# Patient Record
Sex: Female | Born: 1935 | Race: White | Hispanic: No | Smoking: Former smoker
Health system: Southern US, Community
[De-identification: ages and names within clinical notes are randomized; demographics above are authoritative.]

## PROBLEM LIST (undated history)

## (undated) DIAGNOSIS — G4700 Insomnia, unspecified: Secondary | ICD-10-CM

## (undated) DIAGNOSIS — IMO0001 Reserved for inherently not codable concepts without codable children: Secondary | ICD-10-CM

## (undated) DIAGNOSIS — E079 Disorder of thyroid, unspecified: Secondary | ICD-10-CM

## (undated) DIAGNOSIS — R32 Unspecified urinary incontinence: Secondary | ICD-10-CM

## (undated) DIAGNOSIS — D649 Anemia, unspecified: Secondary | ICD-10-CM

## (undated) DIAGNOSIS — S52529A Torus fracture of lower end of unspecified radius, initial encounter for closed fracture: Secondary | ICD-10-CM

## (undated) DIAGNOSIS — I10 Essential (primary) hypertension: Secondary | ICD-10-CM

## (undated) DIAGNOSIS — C4490 Unspecified malignant neoplasm of skin, unspecified: Secondary | ICD-10-CM

## (undated) DIAGNOSIS — C801 Malignant (primary) neoplasm, unspecified: Secondary | ICD-10-CM

## (undated) DIAGNOSIS — K649 Unspecified hemorrhoids: Secondary | ICD-10-CM

## (undated) DIAGNOSIS — L570 Actinic keratosis: Secondary | ICD-10-CM

## (undated) DIAGNOSIS — M1990 Unspecified osteoarthritis, unspecified site: Secondary | ICD-10-CM

## (undated) DIAGNOSIS — R0902 Hypoxemia: Secondary | ICD-10-CM

## (undated) DIAGNOSIS — E039 Hypothyroidism, unspecified: Secondary | ICD-10-CM

## (undated) DIAGNOSIS — K589 Irritable bowel syndrome without diarrhea: Secondary | ICD-10-CM

## (undated) DIAGNOSIS — IMO0002 Reserved for concepts with insufficient information to code with codable children: Secondary | ICD-10-CM

## (undated) DIAGNOSIS — I8390 Asymptomatic varicose veins of unspecified lower extremity: Secondary | ICD-10-CM

## (undated) DIAGNOSIS — R269 Unspecified abnormalities of gait and mobility: Secondary | ICD-10-CM

## (undated) HISTORY — DX: Insomnia, unspecified: G47.00

## (undated) HISTORY — PX: APPENDECTOMY: SHX54

## (undated) HISTORY — DX: Essential (primary) hypertension: I10

## (undated) HISTORY — DX: Disorder of thyroid, unspecified: E07.9

## (undated) HISTORY — DX: Irritable bowel syndrome, unspecified: K58.9

## (undated) HISTORY — DX: Hypothyroidism, unspecified: E03.9

## (undated) HISTORY — DX: Unspecified urinary incontinence: R32

## (undated) HISTORY — PX: OTHER SURGICAL HISTORY: SHX169

## (undated) HISTORY — DX: Unspecified hemorrhoids: K64.9

## (undated) HISTORY — DX: Asymptomatic varicose veins of unspecified lower extremity: I83.90

## (undated) HISTORY — DX: Anemia, unspecified: D64.9

## (undated) HISTORY — DX: Reserved for inherently not codable concepts without codable children: IMO0001

## (undated) HISTORY — PX: ABDOMINAL HYSTERECTOMY: SHX81

## (undated) HISTORY — DX: Unspecified abnormalities of gait and mobility: R26.9

## (undated) HISTORY — DX: Torus fracture of lower end of unspecified radius, initial encounter for closed fracture: S52.529A

## (undated) HISTORY — DX: Hypoxemia: R09.02

## (undated) HISTORY — DX: Unspecified osteoarthritis, unspecified site: M19.90

## (undated) HISTORY — DX: Reserved for concepts with insufficient information to code with codable children: IMO0002

---

## 1998-10-12 ENCOUNTER — Other Ambulatory Visit: Admission: RE | Admit: 1998-10-12 | Discharge: 1998-10-12 | Payer: Self-pay | Admitting: Obstetrics and Gynecology

## 1999-08-24 ENCOUNTER — Encounter: Admission: RE | Admit: 1999-08-24 | Discharge: 1999-08-24 | Payer: Self-pay | Admitting: Endocrinology

## 1999-08-24 ENCOUNTER — Encounter: Payer: Self-pay | Admitting: Endocrinology

## 2000-02-15 ENCOUNTER — Other Ambulatory Visit: Admission: RE | Admit: 2000-02-15 | Discharge: 2000-02-15 | Payer: Self-pay | Admitting: Obstetrics and Gynecology

## 2000-12-24 ENCOUNTER — Ambulatory Visit (HOSPITAL_COMMUNITY): Admission: RE | Admit: 2000-12-24 | Discharge: 2000-12-24 | Payer: Self-pay | Admitting: Urology

## 2001-07-31 ENCOUNTER — Encounter: Payer: Self-pay | Admitting: Internal Medicine

## 2001-07-31 ENCOUNTER — Encounter: Admission: RE | Admit: 2001-07-31 | Discharge: 2001-07-31 | Payer: Self-pay | Admitting: Internal Medicine

## 2001-10-08 HISTORY — PX: FRACTURE SURGERY: SHX138

## 2001-10-08 HISTORY — PX: WRIST FRACTURE SURGERY: SHX121

## 2002-01-16 ENCOUNTER — Other Ambulatory Visit: Admission: RE | Admit: 2002-01-16 | Discharge: 2002-01-16 | Payer: Self-pay | Admitting: Gynecology

## 2002-06-02 ENCOUNTER — Inpatient Hospital Stay (HOSPITAL_COMMUNITY): Admission: RE | Admit: 2002-06-02 | Discharge: 2002-06-04 | Payer: Self-pay | Admitting: Urology

## 2002-06-19 ENCOUNTER — Encounter: Admission: RE | Admit: 2002-06-19 | Discharge: 2002-06-19 | Payer: Self-pay | Admitting: Urology

## 2002-06-19 ENCOUNTER — Encounter: Payer: Self-pay | Admitting: Urology

## 2002-08-03 ENCOUNTER — Encounter: Payer: Self-pay | Admitting: Internal Medicine

## 2002-08-03 ENCOUNTER — Encounter: Admission: RE | Admit: 2002-08-03 | Discharge: 2002-08-03 | Payer: Self-pay | Admitting: Internal Medicine

## 2003-02-02 ENCOUNTER — Other Ambulatory Visit: Admission: RE | Admit: 2003-02-02 | Discharge: 2003-02-02 | Payer: Self-pay | Admitting: Gynecology

## 2003-02-17 ENCOUNTER — Ambulatory Visit (HOSPITAL_COMMUNITY): Admission: RE | Admit: 2003-02-17 | Discharge: 2003-02-17 | Payer: Self-pay | Admitting: Gastroenterology

## 2003-08-12 ENCOUNTER — Encounter: Admission: RE | Admit: 2003-08-12 | Discharge: 2003-08-12 | Payer: Self-pay | Admitting: Internal Medicine

## 2004-03-08 ENCOUNTER — Other Ambulatory Visit: Admission: RE | Admit: 2004-03-08 | Discharge: 2004-03-08 | Payer: Self-pay | Admitting: Gynecology

## 2004-04-11 ENCOUNTER — Encounter: Admission: RE | Admit: 2004-04-11 | Discharge: 2004-04-11 | Payer: Self-pay | Admitting: Gynecology

## 2004-08-14 ENCOUNTER — Encounter: Admission: RE | Admit: 2004-08-14 | Discharge: 2004-08-14 | Payer: Self-pay | Admitting: Gynecology

## 2005-03-01 ENCOUNTER — Encounter
Admission: RE | Admit: 2005-03-01 | Discharge: 2005-03-01 | Payer: Self-pay | Admitting: Physical Medicine and Rehabilitation

## 2005-08-24 ENCOUNTER — Encounter: Admission: RE | Admit: 2005-08-24 | Discharge: 2005-08-24 | Payer: Self-pay | Admitting: Internal Medicine

## 2006-08-26 ENCOUNTER — Encounter: Admission: RE | Admit: 2006-08-26 | Discharge: 2006-08-26 | Payer: Self-pay | Admitting: Internal Medicine

## 2007-06-16 ENCOUNTER — Other Ambulatory Visit: Admission: RE | Admit: 2007-06-16 | Discharge: 2007-06-16 | Payer: Self-pay | Admitting: Gynecology

## 2007-08-28 ENCOUNTER — Encounter: Admission: RE | Admit: 2007-08-28 | Discharge: 2007-08-28 | Payer: Self-pay | Admitting: Internal Medicine

## 2008-08-30 ENCOUNTER — Encounter: Admission: RE | Admit: 2008-08-30 | Discharge: 2008-08-30 | Payer: Self-pay | Admitting: Internal Medicine

## 2010-07-31 ENCOUNTER — Inpatient Hospital Stay (HOSPITAL_COMMUNITY)
Admission: RE | Admit: 2010-07-31 | Discharge: 2010-08-03 | Payer: Self-pay | Source: Home / Self Care | Admitting: Orthopedic Surgery

## 2010-07-31 HISTORY — PX: TOTAL HIP ARTHROPLASTY: SHX124

## 2010-12-20 LAB — URINALYSIS, ROUTINE W REFLEX MICROSCOPIC
Hgb urine dipstick: NEGATIVE
Ketones, ur: NEGATIVE mg/dL
Specific Gravity, Urine: 1.011 (ref 1.005–1.030)
Urobilinogen, UA: 0.2 mg/dL (ref 0.0–1.0)
pH: 7.5 (ref 5.0–8.0)

## 2010-12-20 LAB — CBC
HCT: 28.4 % — ABNORMAL LOW (ref 36.0–46.0)
HCT: 31.4 % — ABNORMAL LOW (ref 36.0–46.0)
HCT: 39.8 % (ref 36.0–46.0)
Hemoglobin: 10.6 g/dL — ABNORMAL LOW (ref 12.0–15.0)
Hemoglobin: 13.5 g/dL (ref 12.0–15.0)
MCH: 33.9 pg (ref 26.0–34.0)
MCH: 33.9 pg (ref 26.0–34.0)
MCH: 34 pg (ref 26.0–34.0)
MCHC: 33.9 g/dL (ref 30.0–36.0)
MCHC: 34.2 g/dL (ref 30.0–36.0)
MCV: 99.2 fL (ref 78.0–100.0)
MCV: 99.5 fL (ref 78.0–100.0)
Platelets: 157 10*3/uL (ref 150–400)
RBC: 3.37 MIL/uL — ABNORMAL LOW (ref 3.87–5.11)
RBC: 3.97 MIL/uL (ref 3.87–5.11)
RDW: 13.4 % (ref 11.5–15.5)
RDW: 13.4 % (ref 11.5–15.5)
RDW: 13.7 % (ref 11.5–15.5)
WBC: 6.5 10*3/uL (ref 4.0–10.5)
WBC: 9.3 10*3/uL (ref 4.0–10.5)

## 2010-12-20 LAB — PROTIME-INR
INR: 1.15 (ref 0.00–1.49)
INR: 1.4 (ref 0.00–1.49)
Prothrombin Time: 13.3 seconds (ref 11.6–15.2)
Prothrombin Time: 14.2 seconds (ref 11.6–15.2)
Prothrombin Time: 14.9 seconds (ref 11.6–15.2)

## 2010-12-20 LAB — COMPREHENSIVE METABOLIC PANEL
AST: 20 U/L (ref 0–37)
Alkaline Phosphatase: 85 U/L (ref 39–117)
BUN: 14 mg/dL (ref 6–23)
CO2: 31 mEq/L (ref 19–32)
Chloride: 98 mEq/L (ref 96–112)
Creatinine, Ser: 0.55 mg/dL (ref 0.4–1.2)
GFR calc Af Amer: >60 mL/min (ref >60)
Glucose, Bld: 92 mg/dL (ref 70–99)
Total Protein: 6.9 g/dL (ref 6.0–8.3)

## 2010-12-20 LAB — BASIC METABOLIC PANEL
CO2: 28 mEq/L (ref 19–32)
Calcium: 8.7 mg/dL (ref 8.4–10.5)
Calcium: 9 mg/dL (ref 8.4–10.5)
Creatinine, Ser: 0.59 mg/dL (ref 0.4–1.2)
GFR calc Af Amer: >60 mL/min (ref >60)
GFR calc Af Amer: >60 mL/min (ref >60)
Glucose, Bld: 95 mg/dL (ref 70–99)
Potassium: 4.8 mEq/L (ref 3.5–5.1)
Sodium: 135 mEq/L (ref 135–145)

## 2010-12-20 LAB — TYPE AND SCREEN
ABO/RH(D): O POS
DAT, IgG: NEGATIVE

## 2010-12-20 LAB — APTT: aPTT: 35 seconds (ref 24–37)

## 2010-12-20 LAB — POTASSIUM: Potassium: 4.9 mEq/L (ref 3.5–5.1)

## 2011-02-23 NOTE — Discharge Summary (Signed)
[name], [name] NO.:  [id number]   MEDICAL RECORD NO.:  [id number]                   PATIENT TYPE:  INP   LOCATION:  0364                                 FACILITY:  [facility]   PHYSICIAN:  Jamison Neighbor, M.D.               DATE OF BIRTH:  04/08/1927   DATE OF ADMISSION:  06/02/2002  DATE OF DISCHARGE:  06/04/2002                                 DISCHARGE SUMMARY   DISCHARGE DIAGNOSES:  1. Vaginal wall prolapse.  2. Enterocele.  3. Hypertension.  4. Mixed urinary incontinence.   PRINCIPAL PROCEDURES:  Vaginal sacropexy, paravaginal repair of cystocele,  Burch bladder neck suspension.   SURGEON:  Jamison Neighbor, M.D.   HISTORY:  This 77 year old female initially presented to my office with  mixed urinary incontinence.  She was treated with anticholinergics, but  still had problems with stress incontinence.  Urodynamics showed diminished  __________  pressure and __________ showed loss of urine.  The patient had a  stress appropriately corrected with a pubovaginal sling.  The patient had  moderately high postvoid residuals in the range of 90 cc.  She still had  residual urge incontinence problems that were successfully treated with an  Oxytrol patch.  The patient presented to the office with new onset of  enterocele and vaginal vault prolapse.  The patient was given options to  therapy and elected to undergo an abdominal approach.  She asked that the  sling be assessed at the time of surgery to determine if any correction  needed to be done.  She was admitted following her vaginal sacropexy and  repair of cystocele and enterocele.   PAST MEDICAL HISTORY:  Unremarkable.  She has mild hypertension.   PAST SURGICAL HISTORY:  Removal of adrenal mass in 1976.  She has also had  hysterectomy and appendectomy.   SOCIAL HISTORY:  Well described in the initial history and physical.   PHYSICAL EXAMINATION:  Well described in the initial history  and physical.   The patient was taken to the operating room on June 02, 2002 where she  underwent vaginal sacropexy, enterocele repair, repair of vaginal tear,  cystocele, and a Burch colposuspension.  Her postoperative course was  unremarkable.  She rapidly advanced to a regular diet.  She tolerated this  without difficulty.  By postoperative day number two she  had passed a voiding trial.  She was eating regular food and ambulating  without any difficulty.  She had voided over 400 cc in one session.  She  says it felt like a much more normal void than she has had in the past.  The  patient was sent home with prescription for Tylox and Keflex and will return  to the office in followup for staple removal.  Jamison Neighbor, M.D.    RJE/MEDQ  D:  06/16/2002  T:  06/16/2002  Job:  [id number]   cc:   Gerlene Burdock A. Jacky Kindle, M.D.

## 2011-02-23 NOTE — Op Note (Signed)
[facility]  Patient:    Carol [name], Carol [name]                         MRN: [id number] Proc. Date: 12/24/00 Adm. Date:  [id number] Attending:  Londell Moh                           Operative Report  PREOPERATIVE DIAGNOSIS:  Stress urinary incontinence.  POSTOPERATIVE DIAGNOSIS:  Stress urinary incontinence.  OPERATION:  Sparc pubovaginal sling plus cystoscopy.  SURGEON:  Jamison Neighbor, M.D.  ANESTHESIA:  Spinal.  COMPLICATIONS:  None.  DRAIN:  16-French Foley catheter to be removed prior to discharge today.  BRIEF HISTORY:  This 77 year old female has stress urinary incontinence as well as a very modest cystocele.  Urodynamic evaluation was performed.  The patient had no evidence of urge incontinence but had evidence of stress incontinence.  The patient underwent cystoscopy which showed an open bladder neck.  The patient has a moderate cystocele and had a very difficult time deciding if she wanted concomitant cystocele repair done at the time of the sling.  The patient initially felt that she wanted a complete outpatient procedure.  For that reason, the Sparc system was selected.  She subsequently felt that she might want to have the cystocele repair done.  Decision was made to perform this under spinal anesthesia so the patient could be consulted intraoperatively and that we would do a minimal procedure if possible, but if there is a large prolapsing cystocele, this would be repaired at the time. The patient understands that if the cystocele is repaired, she needs to stay overnight and then go home with a suprapubic tube.  If the patient has a simple sling using the transvaginal tape system, the patient cannot go home without a catheter.  She understands the risks and benefits of the procedure and gave full and informed consent.  DESCRIPTION OF PROCEDURE:  After the successful induction of a spinal anesthesia, the patient was placed in the low  lithotomy position, prepped with Betadine, and draped in the usual sterile fashion.  The labia was sutured out to the medial thigh.  A weighted vaginal speculum was placed.  Careful inspection revealed there was a very modest cystocele, no enterocele, no rectocele, and no significant vaginal vault prolapse.  It was felt initially that the patient could do just fine with a simple vaginal tape sling.  The area just underneath the urethra was infiltrated with local anesthesia, and a small incision was made directly under the urethra at the proximal level of the mid urethral complex.  Posterior dissection was taken back to the space of Retzius which was not opened.  The patient had two small incisions just the width of a scalpel blade made directly above the pubic bone.  The bladder was drained.  The Sparc needles were then passed from the top incision down to the bottom incision using finger guide.  These were used to pull the tension-free vaginal tape up to the upper incision.  The tape was then tensioned with a right angle clamp.  Cystoscopy was performed.  The bladder was inspected. It was free of any tumor or stones.  Both ureteral orifices were of normal configuration and location.  There was no evidence of injury to the bladder from the sling itself.  The bladder neck appeared to be nicely coapted but not angulated.  The cystoscope could be  inserted, and there was 30 to 45 degrees of play.  The bladder was filled.  There was no leakage, but with the crede, there was prompt straight flow of urine.  This appeared to indicate that a proper degree of tension was obtained.  The area was inspected one final time. A right angle clamp could still be placed underneath the urethra ensuring there was not excessive tension.  The protective sheath from the Sparc system was then pulled away, and the Prolene stitch was cut.  The sling was then cut flush with the skin, and the skin was closed with a  Steri-Strip.  The mucosa was closed with a running suture of 2-0 Vicryl.  At this point, a careful inspection showed there was really very minimal residual cystocele, and it was felt from a morbidity standpoint that this did not really need to be repaired. The patients sedation was reversed.  She was allowed to make a decision as to whether or not she wished to have additional procedure done, and the decision was made jointly to not perform the cystocele repair at this time knowing full well that this may be necessary in the future.  The patients principal concern is her stress incontinence, and this appears to be well addressed with the sling.  The patient tolerated the procedure well and was taken to the recovery room in good condition with Foley catheter in place.  She will be given a voiding trial this afternoon and can go home if voiding normally. Otherwise, she can go home with a Foley catheter for voiding trial in 24 to 48 hours. DD:  12/24/00 TD:  12/24/00 Job: [id number]

## 2011-02-23 NOTE — Op Note (Signed)
Carol [name], FRIESENHAHN NO.:  [id number]   MEDICAL RECORD NO.:  [id number]                   PATIENT TYPE:  INP   LOCATION:  0364                                 FACILITY:  [facility]   PHYSICIAN:  Jamison Neighbor, M.D.               DATE OF BIRTH:  04/08/1927   DATE OF PROCEDURE:  06/02/2002  DATE OF DISCHARGE:                                 OPERATIVE REPORT   PREOPERATIVE DIAGNOSES:  1. Vaginal vault prolapse with associated enterocele.  2. Cystocele.  3. Possible overcorrection of bladder neck.   POSTOPERATIVE DIAGNOSES:  1. Vaginal vault prolapse with associated enterocele.  2. Cystocele.  3. Possible overcorrection of bladder neck.   PROCEDURE:  1. Vaginal sacrocolpopexy.  2. Moschowitz enterocele repair.  3. Burch paravaginal repair.  4. Partial takedown of bladder neck suspension.   SURGEON:  Jamison Neighbor, M.D.   ASSISTANT:  Crecencio Mc, M.D. [facility] resident).   ANESTHESIA:  General after failed spinal.   COMPLICATIONS:  None.   DRAINS:  Foley catheter.   BRIEF HISTORY:  This 77 year old female has a somewhat complicated story.  The patient initially presented to the office with a complaint of urinary  incontinence that turned out to be a mixed urinary incontinence.  At first,  she was felt to have an overactive bladder and was treated with different  medications; however, she refused to take any of these long-term, feeling  that they bothered her more than they helped.  With a urodynamic evaluation  which showed a leak point pressure, and she was found to have urethral  mobility with bolus of urine.  She was told at the initial procedure that  correction of the stress incontinence would not necessarily improve her  urgency and that she would still require long-term therapy.  The patient  elected to have the pubovaginal sling which did take care of her stress  incontinence; however, she feels that the urgency has gotten  worse.  We have  carefully checked her.  The urethra is in normal position.  It did not  appear to be overcorrected.  She had postvoid residuals in the range of 90-  100 cc.  Urethra was in a neutral position when calibrated, and there was no  signs of stenosis or stricturing.  The patient returned a few years later  with posterior prolapse that was primarily an enterocele with a little bit  of central defect as well.  The patient still has good support for the  bladder neck.   The patient was given two options for therapy; one was a transvaginal  anterior repair with an enterocele repair and possible sacrospinalis  fixation.  The other option would be a vaginal sacropexy paravaginal repair  and possible Burch suspension.  The patient wished to have the second  operation, and she did not wish to have another vaginal approach.  She  asked if the sling could be evaluated to determine if it needed a partial  takedown.  The patient understands the risks and benefits of the procedure.  She knows that the principal reason for this procedure is to correct the  vault and to elevate the back of the vagina, eliminate the enterocele.  I  promised her that I would do a careful intraoperative evaluation of the  bladder neck and do a partial takedown if I felt this was indicated.  The  patient understands the potential risk for ongoing urgency which, as noted  above, predates any procedure.  She also notes the possibility of bowel  obstruction, ureteral injury, bleeding, etc.  Full and informed consent was  obtained.   DESCRIPTION OF PROCEDURE:  After the successful induction of general  anesthesia, the patient was placed in the dorsal lithotomy position with the  legs in a low position.  She was then prepped and draped in the usual  sterile fashion.  The patient had a previous Pfannenstiel incision which was  opened.  The incision was carried down through her minimal subcutaneous fat  until the  rectus abdominis sheath was identified.  The rectus sheath was  identified and elevated off the underlying rectus abdominis musculature on  both sides.  On the left side, the muscle was noted to be very thin and  attenuated, possibly from previous surgery, but the fascia itself appeared  to be of good quality.  The rectus was then opened in the midline, allowing  entry into the space of Retzius.  Foley catheter was inserted, and the  bladder was drained.  The patient was noted to have a very large capacity  bladder, holding approximately 600 cc.  The Bookwalter retractor was placed,  and the abdominal contents were retracted superiorly.  The retroperitoneal  incision was then made directly over the sacral promontory.  Blunt  dissection was used to expose the sacral promontory.  Three separate sutures  of 0 Prolene were then placed in the sacral promontory in preparation for  the match suspension.  A narrow Deaver retractor was placed within the  vagina to elevate the vaginal vault.  Three separate sutures of 0 Prolene  were placed into the sacral promontory with double bites taken.  A piece of  Marlex mesh was then obtained and was sutured down to the previously-placed  sacral promontory sutures.  The sutures from the vaginal vault were then  sutured to the Marlex strut with an appropriate degree of tension. When this  was completely tied down, there was excellent elevation of the bladder neck.  Prior to the elevation of bladder neck, a Moschowitz repair had been  performed with a cerclage incision used to completely close off the  enterocele sac.  Care was taken to avoid any injury to the ureters.  A  second Vicryl suture was now used to close the retroperitoneal space over  top of the Marlex mesh.  These two were tied together, completely closing  off the enterocele and completely baring the Marlex into the retroperitoneal  space.  Attention was then directed to the bladder neck.  This was  partially taken down.  The sling material had apparently pulled away from the back of  the rectus bone, as it could not be easily identified.  However, the  attachments to the periosteum were carefully taken down so that the bladder  neck was partially mobilized.  Care was taken to avoid excessive  mobilization for fear that  the patient would develop stress incontinence.  The patient did not require stitches directly at the bladder neck, but  paravaginal type stitches were placed further back to elevate the lateral  aspect of the vagina and to eliminate some of the cystocele that the patient  had posteriorly.  The sutures were brought from the paravaginal tissue up to  the Cooper's ligament.  These were then tied down with appropriate degree of  tension, completing the repair.  Careful vaginal examination showed that the  bladder neck was not angulated.  It was still at a perfectly neutral angle.  The bladder neck was not overcorrected.  There was good support at the top  of the vault and out laterally with no prolapsing structures whatsoever.  The patient does not require anything done for the rectum.  The entire area  was irrigated.  Gloves were changed prior to closure.  The incision was then  closed in layers.  The rectus abdominis was pulled together loosely in the  midline with Vicryl.  A #1 Vicryl was used in a running fashion to close the  fascia.  The skin was closed with surgical clips.  Foley catheter was placed  to straight drainage.  The patient tolerated the procedure well and was  taken to the recovery room in good condition.  The patient will be continued  in the postoperative period on her Oxytrol patch.  The patient does know  there are new drugs that are coming on line for the management of her urge  incontinence and mixed incontinence, and we will make those available once  FDA approval has been obtained should there be ongoing problems with  urgency.  The patient does  know that she may need to go home with Foley  catheter or on self-catheterization if she is not emptying to completion  when she is otherwise ready for discharge.                                               Jamison Neighbor, M.D.    RJE/MEDQ  D:  06/02/2002  T:  06/03/2002  Job:  [id number]   cc:   Gerlene Burdock A. Jacky Kindle, M.D.

## 2011-02-23 NOTE — Op Note (Signed)
Carol [name], [name]                            ACCOUNT NO.:  [id number]   MEDICAL RECORD NO.:  [id number]                   PATIENT TYPE:  AMB   LOCATION:  ENDO                                 FACILITY[facility]   PHYSICIAN:  Petra Kuba, M.D.                 DATE OF BIRTH:  04/08/1927   DATE OF PROCEDURE:  02/17/2003  DATE OF DISCHARGE:                                 OPERATIVE REPORT   PROCEDURE:  Colonoscopy.   INDICATION:  Chronic continue.  Due for colonic screening.  Consent was  signed after risks, benefits, methods, options thoroughly discussed in the  office prior to any premedications given.   MEDICINES USED:  1. Demerol 30.  2. Versed 6.   DESCRIPTION OF PROCEDURE:  Rectal inspection was pertinent for external  hemorrhoids, small.  Digital exam was negative.  Video pediatric adjustable  colonoscope was inserted and with lots of difficulty due to a tortuous  sigmoid, went through this area with abdominal pressure and rolling her on  her back, we were able to advance to the cecum.  She had a very long,  tortuous, looping colon, but no abnormalities were seen on insertion.  The  cecum was identified by the appendiceal orifice and the ileocecal valve.  In  fact, the scope was inserted a short ways up the terminal ileum which was  normal.  Photodocumentation was obtained  The scope was slowly withdrawn.  The prep was adequate.  There was some liquid stool that required washing  and suctioning.  On slow withdrawal through the colon, other than it being  very tortuous, long, and looping when we fell back around a loop, we did try  to readvance around it to decrease the chances of missing things.  The cecum  was the most tortuous.  There were some loops that we could not readvance  but other than a rare, left-sided diverticula, no polyps, masses, or other  abnormalities were seen as we slowly withdrew back to the rectum.  Once back  in the rectum, the scope was retroflexed,  pertinet for some internal  hemorrhoids.  The scope was straightened and readvanced a short ways up the  left side of the colon; air was suctioned, the scope removed.  The patient  tolerated the procedure well.  There was no obvious immediate complication.   ENDOSCOPIC DIAGNOSES:  1. Internal-external small hemorrhoids.  2. Rare left-sided diverticula.  3. Very tortuous colon.  4. Otherwise, within normal limits to the cecum and the terminal ileum.   PLAN:  1. Repeat screening in five years if doing well medically.  2. Yearly rectals and guaiacs per either [employer]. Farrel Gobble, M.D. or Geoffry Paradise, M.D.  3.     I am happy to see back p.r.n.  4. We will go ahead and try Crystallose to see if that works better than  Miralax.  Possibly then try Zelnorm next.  5. Happy to see back, as above.                                               Petra Kuba, M.D.    MEM/MEDQ  D:  02/17/2003  T:  02/17/2003  Job:  [id number]   cc:   Ivor Costa. Farrel Gobble, M.D.  [address], [address]  [place]  [place] [address]  Fax: [phone]   Geoffry Paradise, M.D.  [address]  [place]  [place] [address]  Fax: [phone]

## 2011-02-23 NOTE — H&P (Signed)
Carol [name], Carol [name] NO.:  [id number]   MEDICAL RECORD NO.:  [id number]                   PATIENT TYPE:  INP   LOCATION:  X001                                 FACILITY:  [facility]   PHYSICIAN:  Jamison Neighbor, M.D.               DATE OF BIRTH:  04/08/1927   DATE OF ADMISSION:  06/02/2002  DATE OF DISCHARGE:                                HISTORY & PHYSICAL   ADMISSION DIAGNOSES:  1. Vaginal vault prolapse.  2. Enterocele.  3. Longstanding urgency incontinence.   HISTORY OF PRESENT ILLNESS:  This is a 77 year old female who initially  presented to my office with mixed urinary incontinence.  The patient was  found on initial evaluation to primarily have urgency and she was treated  with anticholinergics.  The patient did not like the side effects  particularly the dry mouth and we eventually performed a urodynamic  evaluation.  This did show diminished lead point pressure and Marshall test  did show some hypermobility with loss of urine.  We recommended the patient  should consider fixing the stress portion of her leakage.  The patient  elected to have a pubovaginal sling performed.  At the time of the procedure  she had a very minimal cystocele which corrected nicely with the sling.  She  had nice improvement in her stress incontinence but she felt the urgency had  not improved and in fact, she thought it might be somewhat worse.  We were  concerned that she might be overcorrected.  We carefully evaluated the  bladder outlet.  She had a normal urethra.  She did not have angulation and  post residuals in the range of 90 to 100 cc indicating she was likely not  overcorrected.  The patient eventually was given an Oxytrol patch with some  improvement but has been concerned that she might have problems with  overcorrection of the bladder neck. The patient recently represented with  development of a posterior cystocele unrelated to her initial surgery.   She  now has an enterocele and vault prolapse. The patient was told she could  have this repaired transabdominally or vaginally.  She requested the  abdominal approach be done so that we could completely correct the  enterocele and suspend her up to the sacrum.  She also requested if possible  to be partial take-down of the sling and consideration for resuspension if  necessary.  The patient gave full and informed consent for the procedure.   PAST MEDICAL HISTORY:  This is unremarkable.  She takes Lotrel one daily for  mild to moderate hypertension.  She had an adrenal mass removed back in  1976.  She has had a hysterectomy and appendectomy performed.  She has no  other chronic medical illnesses and no other surgery.   SOCIAL HISTORY:  This is unremarkable.  She drinks modest amounts of  alcohol. She  does not use tobacco, having stopped 26 years ago.  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   PHYSICAL EXAMINATION:  GENERAL APPEARANCE:  She is a well-developed, well-  nourished female in no acute distress.  HEENT:  Normocephalic and atraumatic.  Cranial nerves II-XII grossly intact.  NECK:  Supple with no adenopathy or thyromegaly.  LUNGS:  Clear.  CARDIOVASCULAR:  Regular rate and rhythm without murmurs, thrills, gallops,  rubs, or heaves.  ABDOMEN:  Soft and nontender with no palpable masses, rebound or guarding.  She has a well-healed Pfannenstiel incision.  PELVIC:  There is prolapse at the top of the vaginal vault dragging the  bladder down with it.  EXTREMITIES:  There was no clubbing, cyanosis, or edema.   IMPRESSION:  Vaginal vault prolapse with associated enterocele and  cystocele.   PLAN:  Admit following vaginal sacropexy and enterocele repair.                                                 Jamison Neighbor, M.D.    RJE/MEDQ  D:  06/02/2002  T:  06/02/2002  Job:  [id number]   cc:   Gerlene Burdock A. Jacky Kindle, M.D.

## 2011-06-09 DIAGNOSIS — C4490 Unspecified malignant neoplasm of skin, unspecified: Secondary | ICD-10-CM

## 2011-06-09 HISTORY — DX: Unspecified malignant neoplasm of skin, unspecified: C44.90

## 2012-02-28 ENCOUNTER — Ambulatory Visit
Admission: RE | Admit: 2012-02-28 | Discharge: 2012-02-28 | Disposition: A | Discharge status: Home / Self Care | Payer: Medicare Other | Source: Ambulatory Visit | Attending: Internal Medicine | Admitting: Internal Medicine

## 2012-02-28 ENCOUNTER — Other Ambulatory Visit: Payer: Self-pay | Admitting: Internal Medicine

## 2012-02-28 DIAGNOSIS — R05 Cough: Secondary | ICD-10-CM

## 2012-08-07 DIAGNOSIS — C801 Malignant (primary) neoplasm, unspecified: Secondary | ICD-10-CM

## 2012-08-07 DIAGNOSIS — C4492 Squamous cell carcinoma of skin, unspecified: Secondary | ICD-10-CM | POA: Insufficient documentation

## 2012-08-07 HISTORY — DX: Malignant (primary) neoplasm, unspecified: C80.1

## 2012-09-01 ENCOUNTER — Encounter: Payer: Self-pay | Admitting: Radiation Oncology

## 2012-09-01 DIAGNOSIS — L570 Actinic keratosis: Secondary | ICD-10-CM | POA: Insufficient documentation

## 2012-09-01 NOTE — Progress Notes (Signed)
New consult  Squamous cell carcinoma right posterior leg nodul=bx=08/07/12 Scaly and crusting 3 >1cm lesions close proximity Tip right nose lesion Patient alert,oriented x3, Retired RN [employer], right posterior calf with bandage on, patient states'It's been like that for 6 months now,its raw and bleeds" No  Children, single, No c/o pain ,"it just itches like crazy"  Allergies: NKDA 12:51 PM

## 2012-09-02 ENCOUNTER — Ambulatory Visit
Admission: RE | Admit: 2012-09-02 | Discharge: 2012-09-02 | Disposition: A | Discharge status: Home / Self Care | Payer: Medicare Other | Source: Ambulatory Visit | Attending: Radiation Oncology | Admitting: Radiation Oncology

## 2012-09-02 ENCOUNTER — Encounter: Payer: Self-pay | Admitting: Radiation Oncology

## 2012-09-02 VITALS — BP 167/86 | HR 79 | Temp 97.7°F | Resp 20

## 2012-09-02 DIAGNOSIS — C801 Malignant (primary) neoplasm, unspecified: Secondary | ICD-10-CM

## 2012-09-02 DIAGNOSIS — C439 Malignant melanoma of skin, unspecified: Secondary | ICD-10-CM | POA: Insufficient documentation

## 2012-09-02 HISTORY — DX: Unspecified osteoarthritis, unspecified site: M19.90

## 2012-09-02 HISTORY — DX: Actinic keratosis: L57.0

## 2012-09-02 HISTORY — DX: Malignant (primary) neoplasm, unspecified: C80.1

## 2012-09-02 HISTORY — DX: Unspecified malignant neoplasm of skin, unspecified: C44.90

## 2012-09-02 NOTE — Progress Notes (Signed)
[facility] Radiation Oncology NEW PATIENT EVALUATION  Name: Carol [name] MRN: [id number]  Date:   09/02/2012           DOB: [birth year]  Status: outpatient   CC:  Carol [name] Candace Gallus., *    REFERRING PHYSICIAN: Leonie [name] Candace Gallus., *   DIAGNOSIS: Squamous cell carcinoma the skin, right posterior calf   HISTORY OF PRESENT ILLNESS:  Carol [name] is a 78 y.o. female who is seen today for the courtesy Dr. Lovenia [name] for consideration of electron beam radiation therapy in the management of her squamous cell carcinoma the skin involving the lower right posterior calf. She tells me that she traumatized her lower right posterior calf approximately 6 months ago after missing a step. Since then she has had persistent wound healing issues. She was seen by Carol [name] of [facility] who noted 3 hyperkeratotic nodules, one of which was ulcerated along the inferior aspect of her right posterior calf. She performed shave biopsies of the medial and lateral lesions with a diagnosis of squamous cell carcinoma, keratoacanthoma-like pattern on 08/07/2012. She was also seen by Dr. Mathews Robinsons do not feel that she was a candidate for Mohs surgery because of probable wound healing difficulties with excision which would require skin grafting.  PREVIOUS RADIATION THERAPY: No   PAST MEDICAL HISTORY:  has a past medical history of AK (actinic keratosis); Cancer (08/07/12); Skin cancer (06/2011); and Arthritis.     PAST SURGICAL HISTORY:  Past Surgical History  Procedure Date  . Abdominal hysterectomy     early 40's, abdominal  . Appendectomy      FAMILY HISTORY: family history includes Cancer in her mother. Her mother died of old age in her 58s. Her father died of a stroke in his 61s.   SOCIAL HISTORY:  reports that she has quit smoking. Her smoking use included Cigarettes. She has a 5 pack-year smoking history. She has never used smokeless tobacco. She reports that she  does not drink alcohol. 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 Divorced, no children. She has significant sun exposure at the [facility] .   ALLERGIES: Review of patient's allergies indicates not on file.   MEDICATIONS:  Current Outpatient Prescriptions  Medication Sig Dispense Refill  . OVER THE COUNTER MEDICATION Apply 1 drop topically daily. i-cap gtts in each eye daily      . OVER THE COUNTER MEDICATION Take 1 tablet by mouth daily. otc for cholestrol      . polyethylene glycol (MIRALAX / GLYCOLAX) packet Take 17 g by mouth daily as needed.          REVIEW OF SYSTEMS:  Pertinent items are noted in HPI.    PHYSICAL EXAM:  oral temperature is 97.7 F (36.5 C). Her blood pressure is 167/86 and her pulse is 79. Her respiration is 20 and oxygen saturation is 98%.   Alert and oriented. Examination limited to her right lower extremity. On inspection of the anterior aspect of the right posterior calf there are 3 hyperkeratotic lesions. Superior to the right is a biopsy wound measuring 1.5 cm. Just to the  left of this is a 1 cm hyperkeratotic nodule, and inferior to both lesions is a 0.6 cm hyperkeratotic nodule. There are actinic changes along the circumference of the mid to lower calf along with venous stasis changes. There is no popliteal adenopathy.   LABORATORY DATA:  Lab Results  Component Value Date   WBC 6.5 08/03/2010  HGB 9.7* 08/03/2010   HCT 28.4* 08/03/2010   MCV 99.5 08/03/2010   PLT 157 08/03/2010   Lab Results  Component Value Date   NA 135 08/02/2010   K 4.8 DELTA CHECK NOTED NO VISIBLE HEMOLYSIS 08/02/2010   CL 104 08/02/2010   CO2 28 08/02/2010   Lab Results  Component Value Date   ALT 9 07/24/2010   AST 20 07/24/2010   ALKPHOS 85 07/24/2010   BILITOT 1.0 07/24/2010      IMPRESSION: Multiple squamous cell carcinomas of the skin along the inferior aspect of the right posterior calf. We discussed surgical  excision which would probably requiring skin grafting versus fractionated electron beam radiation therapy. Because of the location along the distal extremity and size of radiation field, she will require a fractionated six-week course of treatment. We discussed the potential acute and late toxicities including poor wound healing and thinning of her skin. She wants to proceed with radiation therapy. Consent is signed today.   PLAN: We'll plan to proceed with electron beam simulation early next week.   I spent 40 minutes minutes face to face with the patient and more than 50% of that time was spent in counseling and/or coordination of care.

## 2012-09-02 NOTE — Progress Notes (Signed)
Please see the Nurse Progress Note in the MD Initial Consult Encounter for this patient. 

## 2012-09-02 NOTE — Progress Notes (Signed)
Redressed patient's right leg with telfa dressing  and paper tape,assisted patient off table

## 2012-09-02 NOTE — Addendum Note (Signed)
Encounter addended by: Lowella Petties, RN on: 09/02/2012  1:58 PM<BR>     Documentation filed: Visit Diagnoses, Notes Section

## 2012-09-03 NOTE — Addendum Note (Signed)
Encounter addended by: Karmen Altamirano Mintz Nala Kachel, RN on: 09/03/2012  7:23 PM<BR>     Documentation filed: Charges VN

## 2012-09-09 ENCOUNTER — Ambulatory Visit
Admission: RE | Admit: 2012-09-09 | Discharge: 2012-09-09 | Disposition: A | Discharge status: Home / Self Care | Payer: Medicare Other | Source: Ambulatory Visit | Attending: Radiation Oncology | Admitting: Radiation Oncology

## 2012-09-09 DIAGNOSIS — Z510 Encounter for antineoplastic radiation therapy: Secondary | ICD-10-CM | POA: Insufficient documentation

## 2012-09-09 DIAGNOSIS — L538 Other specified erythematous conditions: Secondary | ICD-10-CM | POA: Insufficient documentation

## 2012-09-09 DIAGNOSIS — C4442 Squamous cell carcinoma of skin of scalp and neck: Secondary | ICD-10-CM | POA: Insufficient documentation

## 2012-09-09 DIAGNOSIS — C801 Malignant (primary) neoplasm, unspecified: Secondary | ICD-10-CM

## 2012-09-09 NOTE — Progress Notes (Signed)
Electron beam simulation/treatment planning: The patient underwent electron beam simulation/treatment planning in the management of her squamous cell carcinomas of the skin. A Vac Loc immobilization device was constructed with the patient prone. She was set up en face to her posterior right calf. One custom block is constructed to conform the field. 0.8 cm of custom bolus will be constructed in apply to her skin on the first day of her treatment. A special port plan is requested. I prescribing 6000 cGy in 30 sessions utilizing 6 MEV electrons.

## 2012-09-10 ENCOUNTER — Ambulatory Visit
Admission: RE | Admit: 2012-09-10 | Discharge: 2012-09-10 | Disposition: A | Discharge status: Home / Self Care | Payer: Medicare Other | Source: Ambulatory Visit | Attending: Radiation Oncology | Admitting: Radiation Oncology

## 2012-09-11 ENCOUNTER — Ambulatory Visit
Admission: RE | Admit: 2012-09-11 | Discharge: 2012-09-11 | Disposition: A | Discharge status: Home / Self Care | Payer: Medicare Other | Source: Ambulatory Visit | Attending: Radiation Oncology | Admitting: Radiation Oncology

## 2012-09-12 ENCOUNTER — Ambulatory Visit
Admission: RE | Admit: 2012-09-12 | Discharge: 2012-09-12 | Disposition: A | Discharge status: Home / Self Care | Payer: Medicare Other | Source: Ambulatory Visit | Attending: Radiation Oncology | Admitting: Radiation Oncology

## 2012-09-15 ENCOUNTER — Ambulatory Visit
Admission: RE | Admit: 2012-09-15 | Discharge: 2012-09-15 | Disposition: A | Discharge status: Home / Self Care | Payer: Medicare Other | Source: Ambulatory Visit | Attending: Radiation Oncology | Admitting: Radiation Oncology

## 2012-09-15 ENCOUNTER — Encounter: Payer: Self-pay | Admitting: Radiation Oncology

## 2012-09-15 VITALS — BP 128/65 | HR 85 | Temp 98.4°F | Resp 20 | Wt 106.6 lb

## 2012-09-15 DIAGNOSIS — C801 Malignant (primary) neoplasm, unspecified: Secondary | ICD-10-CM

## 2012-09-15 MED ORDER — BIAFINE EX EMUL
CUTANEOUS | Status: DC | PRN
  Administered 2012-09-15: 15:00:00 via TOPICAL

## 2012-09-15 NOTE — Progress Notes (Signed)
Patient here weekly rad txs: 4/30 right calf,  completed, calf has discoloration purplish red,thin skin, marked area, gave post sim teaching on skin, gave biafine cream with instructions to use daily after rad txs, gave schedule also, my business card, telfa dressing teach back by patient 3:01 PM

## 2012-09-15 NOTE — Addendum Note (Signed)
Encounter addended by: Lowella Petties, RN on: 09/15/2012  3:13 PM<BR>     Documentation filed: Orders, Inpatient MAR

## 2012-09-15 NOTE — Progress Notes (Signed)
Weekly Management Note:  Site: Right posterior calf Current Dose:  800  cGy Projected Dose: 6  cGy  Narrative: The patient is seen today for routine under treatment assessment. CBCT/MVCT images/port films were reviewed. The chart was reviewed.   No complaints today. She was given Biafine cream to use when necessary  Physical Examination:  Filed Vitals:   09/15/12 1454  BP: 128/65  Pulse: 85  Temp: 98.4 F (36.9 C)  Resp: 20  .  Weight: 106 lb 9.6 oz (48.353 kg). No significant skin changes.  Impression: Tolerating radiation therapy well.  Plan: Continue radiation therapy as planned.

## 2012-09-16 ENCOUNTER — Ambulatory Visit
Admission: RE | Admit: 2012-09-16 | Discharge: 2012-09-16 | Disposition: A | Discharge status: Home / Self Care | Payer: Medicare Other | Source: Ambulatory Visit | Attending: Radiation Oncology | Admitting: Radiation Oncology

## 2012-09-17 ENCOUNTER — Ambulatory Visit
Admission: RE | Admit: 2012-09-17 | Discharge: 2012-09-17 | Disposition: A | Discharge status: Home / Self Care | Payer: Medicare Other | Source: Ambulatory Visit | Attending: Radiation Oncology | Admitting: Radiation Oncology

## 2012-09-18 ENCOUNTER — Ambulatory Visit
Admission: RE | Admit: 2012-09-18 | Discharge: 2012-09-18 | Disposition: A | Discharge status: Home / Self Care | Payer: Medicare Other | Source: Ambulatory Visit | Attending: Radiation Oncology | Admitting: Radiation Oncology

## 2012-09-18 DIAGNOSIS — C801 Malignant (primary) neoplasm, unspecified: Secondary | ICD-10-CM

## 2012-09-19 ENCOUNTER — Ambulatory Visit
Admission: RE | Admit: 2012-09-19 | Discharge: 2012-09-19 | Disposition: A | Discharge status: Home / Self Care | Payer: Medicare Other | Source: Ambulatory Visit | Attending: Radiation Oncology | Admitting: Radiation Oncology

## 2012-09-19 NOTE — Progress Notes (Signed)
Late entry from 09/18/2012. Patient presented to the clinic today requesting someone exam her right calf wound. Assessed right calf wound. Wound is scabbed without edema or drainage. Expected hyperpigmentation around wound noted. Applied a telfa dressing with paper tape to wound. Encouraged patient to continue using Biafine cream. Patient verbalized understanding. Escorted patient to lobby.

## 2012-09-22 ENCOUNTER — Ambulatory Visit
Admission: RE | Admit: 2012-09-22 | Discharge: 2012-09-22 | Disposition: A | Discharge status: Home / Self Care | Payer: Medicare Other | Source: Ambulatory Visit | Attending: Radiation Oncology | Admitting: Radiation Oncology

## 2012-09-22 ENCOUNTER — Encounter: Payer: Self-pay | Admitting: Radiation Oncology

## 2012-09-22 VITALS — BP 151/75 | HR 93 | Temp 97.6°F | Resp 20 | Wt 104.2 lb

## 2012-09-22 DIAGNOSIS — C801 Malignant (primary) neoplasm, unspecified: Secondary | ICD-10-CM

## 2012-09-22 NOTE — Progress Notes (Signed)
Patient here weekly rad tx right calf, completed 9/30, alert,oriented x3, wound scabbed,no drainage, secured with telfa dressing and paper taped to skin, no c/o pain at present,uses biafine to leg, patient does have some fatigue,says she eats enough, problem with her is her lumbar region ,uses her walker at home ,non foldable , and she walks with a cane, steady gait here  2:38 PM  2:38 PM

## 2012-09-22 NOTE — Progress Notes (Signed)
Weekly Management Note:  Site: Right posterior calf Current Dose:  1800  cGy Projected Dose: 6000  cGy  Narrative: The patient is seen today for routine under treatment assessment. CBCT/MVCT images/port films were reviewed. The chart was reviewed.   No complaints today. She uses a Telfa dressing.  Physical Examination:  Filed Vitals:   09/22/12 1433  BP: 151/75  Pulse: 93  Temp: 97.6 F (36.4 C)  Resp: 20  .  Weight: 104 lb 3.2 oz (47.265 kg). No significant skin changes.  Impression: Tolerating radiation therapy well.  Plan: Continue radiation therapy as planned.

## 2012-09-23 ENCOUNTER — Ambulatory Visit
Admission: RE | Admit: 2012-09-23 | Discharge: 2012-09-23 | Disposition: A | Discharge status: Home / Self Care | Payer: Medicare Other | Source: Ambulatory Visit | Attending: Radiation Oncology | Admitting: Radiation Oncology

## 2012-09-24 ENCOUNTER — Ambulatory Visit
Admission: RE | Admit: 2012-09-24 | Discharge: 2012-09-24 | Disposition: A | Discharge status: Home / Self Care | Payer: Medicare Other | Source: Ambulatory Visit | Attending: Radiation Oncology | Admitting: Radiation Oncology

## 2012-09-25 ENCOUNTER — Ambulatory Visit
Admission: RE | Admit: 2012-09-25 | Discharge: 2012-09-25 | Disposition: A | Discharge status: Home / Self Care | Payer: Medicare Other | Source: Ambulatory Visit | Attending: Radiation Oncology | Admitting: Radiation Oncology

## 2012-09-26 ENCOUNTER — Ambulatory Visit
Admission: RE | Admit: 2012-09-26 | Discharge: 2012-09-26 | Disposition: A | Discharge status: Home / Self Care | Payer: Medicare Other | Source: Ambulatory Visit | Attending: Radiation Oncology | Admitting: Radiation Oncology

## 2012-09-29 ENCOUNTER — Ambulatory Visit
Admission: RE | Admit: 2012-09-29 | Discharge: 2012-09-29 | Disposition: A | Discharge status: Home / Self Care | Payer: Medicare Other | Source: Ambulatory Visit | Attending: Radiation Oncology | Admitting: Radiation Oncology

## 2012-09-29 ENCOUNTER — Encounter: Payer: Self-pay | Admitting: Radiation Oncology

## 2012-09-29 VITALS — BP 176/81 | HR 85 | Temp 97.8°F | Resp 20 | Wt 105.5 lb

## 2012-09-29 DIAGNOSIS — C801 Malignant (primary) neoplasm, unspecified: Secondary | ICD-10-CM

## 2012-09-29 NOTE — Progress Notes (Signed)
pagtient here gor weekly rad txs, right calf, erythema and scabbed area, no c/o pain there, no bandage on this week, uses biafine cream after rad tx, patient eating well enough she states for her drinks 4 big glasses water daily, c/o lumbar pain  2:39 PM

## 2012-09-29 NOTE — Progress Notes (Signed)
   Weekly Management Note:  Outpatient, right calf Current Dose:  26 Gy  Projected Dose: 60 Gy   Narrative:  The patient presents for routine under treatment assessment.  CBCT/MVCT images/Port film x-rays were reviewed.  The chart was checked. She is doing well. She complains of   lumbar pain which is chronic, related to prior injury/fall. No pain in the treatment area.  Physical Findings:  weight is 105 lb 8 oz (47.854 kg). Her oral temperature is 97.8 F (36.6 C). Her blood pressure is 176/81 and her pulse is 85. Her respiration is 20.  thin skin with venous statis changes. Hyperpigmentation and dry desquamation in tx area of right calf.  Impression:  The patient is tolerating radiotherapy.  Plan:  Continue radiotherapy as planned.  ________________________________   Lonie Peak, M.D.

## 2012-09-30 ENCOUNTER — Ambulatory Visit
Admission: RE | Admit: 2012-09-30 | Discharge: 2012-09-30 | Disposition: A | Discharge status: Home / Self Care | Payer: Medicare Other | Source: Ambulatory Visit | Attending: Radiation Oncology | Admitting: Radiation Oncology

## 2012-10-02 ENCOUNTER — Ambulatory Visit
Admission: RE | Admit: 2012-10-02 | Discharge: 2012-10-02 | Disposition: A | Discharge status: Home / Self Care | Payer: Medicare Other | Source: Ambulatory Visit | Attending: Radiation Oncology | Admitting: Radiation Oncology

## 2012-10-03 ENCOUNTER — Ambulatory Visit
Admission: RE | Admit: 2012-10-03 | Discharge: 2012-10-03 | Disposition: A | Discharge status: Home / Self Care | Payer: Medicare Other | Source: Ambulatory Visit | Attending: Radiation Oncology | Admitting: Radiation Oncology

## 2012-10-06 ENCOUNTER — Ambulatory Visit
Admission: RE | Admit: 2012-10-06 | Discharge: 2012-10-06 | Disposition: A | Discharge status: Home / Self Care | Payer: Medicare Other | Source: Ambulatory Visit | Attending: Radiation Oncology | Admitting: Radiation Oncology

## 2012-10-06 ENCOUNTER — Encounter: Payer: Self-pay | Admitting: Radiation Oncology

## 2012-10-06 VITALS — BP 154/80 | HR 94 | Temp 97.8°F | Resp 20 | Wt 103.7 lb

## 2012-10-06 DIAGNOSIS — C801 Malignant (primary) neoplasm, unspecified: Secondary | ICD-10-CM

## 2012-10-06 NOTE — Progress Notes (Signed)
Patient here weekly rad tx right calf, 18/30 completed, thin papery skin on leg, purplish in color  hyperpigmentation,dry desquamation, one scab had come off, no bleeding, eating okay,,had salmon,baked potato  and salad for lunch , occasional itchiness on calf, but goes away quickly,uses baifine cream tid 2:28 PM

## 2012-10-06 NOTE — Progress Notes (Signed)
Weekly Management Note:  Site: Right posterior calf Current Dose:  3600  cGy Projected Dose: 6  cGy  Narrative: The patient is seen today for routine under treatment assessment. CBCT/MVCT images/port films were reviewed. The chart was reviewed.   No new complaints today.  Physical Examination:  Filed Vitals:   10/06/12 1428  BP: 154/80  Pulse: 94  Temp: 97.8 F (36.6 C)  Resp: 20  .  Weight: 103 lb 11.2 oz (47.038 kg). There is hyperpigmentation the skin along with crusting of her 3 skin lesions. No desquamation.  Impression: Tolerating radiation therapy well.  Plan: Continue radiation therapy as planned.

## 2012-10-07 ENCOUNTER — Ambulatory Visit
Admission: RE | Admit: 2012-10-07 | Discharge: 2012-10-07 | Disposition: A | Discharge status: Home / Self Care | Payer: Medicare Other | Source: Ambulatory Visit | Attending: Radiation Oncology | Admitting: Radiation Oncology

## 2012-10-09 ENCOUNTER — Ambulatory Visit
Admission: RE | Admit: 2012-10-09 | Discharge: 2012-10-09 | Disposition: A | Discharge status: Home / Self Care | Payer: Medicare Other | Source: Ambulatory Visit | Attending: Radiation Oncology | Admitting: Radiation Oncology

## 2012-10-10 ENCOUNTER — Ambulatory Visit
Admission: RE | Admit: 2012-10-10 | Discharge: 2012-10-10 | Disposition: A | Discharge status: Home / Self Care | Payer: Medicare Other | Source: Ambulatory Visit | Attending: Radiation Oncology | Admitting: Radiation Oncology

## 2012-10-13 ENCOUNTER — Ambulatory Visit
Admission: RE | Admit: 2012-10-13 | Discharge: 2012-10-13 | Disposition: A | Discharge status: Home / Self Care | Payer: Medicare Other | Source: Ambulatory Visit | Attending: Radiation Oncology | Admitting: Radiation Oncology

## 2012-10-13 ENCOUNTER — Encounter: Payer: Self-pay | Admitting: Radiation Oncology

## 2012-10-13 VITALS — BP 131/90 | HR 85 | Temp 97.7°F | Resp 20 | Wt 107.8 lb

## 2012-10-13 DIAGNOSIS — L570 Actinic keratosis: Secondary | ICD-10-CM

## 2012-10-13 DIAGNOSIS — C801 Malignant (primary) neoplasm, unspecified: Secondary | ICD-10-CM

## 2012-10-13 MED ORDER — BIAFINE EX EMUL
CUTANEOUS | Status: DC | PRN
  Administered 2012-10-13: 15:00:00 via TOPICAL

## 2012-10-13 NOTE — Progress Notes (Signed)
Weekly Management Note:  Site: Right posterior calf Current Dose:  4400  cGy Projected Dose: 6  cGy  Narrative: The patient is seen today for routine under treatment assessment. CBCT/MVCT images/port films were reviewed. The chart was reviewed.   She is without complaints today.  Physical Examination:  Filed Vitals:   10/13/12 1510  BP: 131/90  Pulse: 85  Temp: 97.7 F (36.5 C)  Resp: 20  .  Weight: 107 lb 12.8 oz (48.898 kg). There is moderate erythema the skin with further crusting of the carcinomas.  Impression: Tolerating radiation therapy well. I told her to apply antibiotic ointment twice a day and to cover this with a Telfa pad and paper tape.  Plan: Continue radiation therapy as planned.

## 2012-10-13 NOTE — Progress Notes (Signed)
Patient here for weekly rad txs, right calf, completed 22/30 so far, hyperpigmentation on skin, crusted lesions,dry desquamation, 2nd tube biafine cream given per patient request, and telfa dressings, patient c/o occasionally shooting pain,  3:09 PM

## 2012-10-14 ENCOUNTER — Ambulatory Visit: Payer: Medicare Other

## 2012-10-15 ENCOUNTER — Ambulatory Visit
Admission: RE | Admit: 2012-10-15 | Discharge: 2012-10-15 | Disposition: A | Discharge status: Home / Self Care | Payer: Medicare Other | Source: Ambulatory Visit | Attending: Radiation Oncology | Admitting: Radiation Oncology

## 2012-10-16 ENCOUNTER — Ambulatory Visit
Admission: RE | Admit: 2012-10-16 | Discharge: 2012-10-16 | Disposition: A | Discharge status: Home / Self Care | Payer: Medicare Other | Source: Ambulatory Visit | Attending: Radiation Oncology | Admitting: Radiation Oncology

## 2012-10-17 ENCOUNTER — Ambulatory Visit
Admission: RE | Admit: 2012-10-17 | Discharge: 2012-10-17 | Disposition: A | Discharge status: Home / Self Care | Payer: Medicare Other | Source: Ambulatory Visit | Attending: Radiation Oncology | Admitting: Radiation Oncology

## 2012-10-20 ENCOUNTER — Encounter: Payer: Self-pay | Admitting: Radiation Oncology

## 2012-10-20 ENCOUNTER — Ambulatory Visit
Admission: RE | Admit: 2012-10-20 | Discharge: 2012-10-20 | Disposition: A | Discharge status: Home / Self Care | Payer: Medicare Other | Source: Ambulatory Visit | Attending: Radiation Oncology | Admitting: Radiation Oncology

## 2012-10-20 VITALS — BP 147/79 | HR 93 | Temp 97.8°F | Resp 20 | Wt 104.1 lb

## 2012-10-20 DIAGNOSIS — C801 Malignant (primary) neoplasm, unspecified: Secondary | ICD-10-CM

## 2012-10-20 NOTE — Progress Notes (Signed)
Weekly Management Note:  Site: Right posterior Current Dose:  5200  cGy Projected Dose: 6 down  cGy  Narrative: The patient is seen today for routine under treatment assessment. CBCT/MVCT images/port films were reviewed. The chart was reviewed.   She is having more drainage along her tumor bed. She has been applying Neosporin to  Physical Examination:  Filed Vitals:   10/20/12 1516  BP: 147/79  Pulse: 93  Temp: 97.8 F (36.6 C)  Resp: 20  .  Weight: 104 lb 1.6 oz (47.219 kg). There is moist desquamation at the sites of her previously noted areas of keratoses/carcinomas.  Impression: Tolerating radiation therapy well. She'll continue with antibiotic ointment and Telfa pads. She'll finish her radiation therapy this Friday. I will see her again this Thursday  Plan: Continue radiation therapy as planned.

## 2012-10-20 NOTE — Progress Notes (Signed)
Pt applying Neosporin and Telfa to right posterior calf for moist desquamation. She states the area is painful, but she does not take Tylenol or any OTC for the pain.

## 2012-10-21 ENCOUNTER — Ambulatory Visit
Admission: RE | Admit: 2012-10-21 | Discharge: 2012-10-21 | Disposition: A | Discharge status: Home / Self Care | Payer: Medicare Other | Source: Ambulatory Visit | Attending: Radiation Oncology | Admitting: Radiation Oncology

## 2012-10-22 ENCOUNTER — Ambulatory Visit
Admission: RE | Admit: 2012-10-22 | Discharge: 2012-10-22 | Disposition: A | Discharge status: Home / Self Care | Payer: Medicare Other | Source: Ambulatory Visit | Attending: Radiation Oncology | Admitting: Radiation Oncology

## 2012-10-23 ENCOUNTER — Ambulatory Visit
Admission: RE | Admit: 2012-10-23 | Discharge: 2012-10-23 | Disposition: A | Discharge status: Home / Self Care | Payer: Medicare Other | Source: Ambulatory Visit | Attending: Radiation Oncology | Admitting: Radiation Oncology

## 2012-10-23 ENCOUNTER — Ambulatory Visit: Payer: Medicare Other

## 2012-10-23 VITALS — BP 121/62 | HR 85 | Temp 98.1°F | Resp 20

## 2012-10-23 DIAGNOSIS — C801 Malignant (primary) neoplasm, unspecified: Secondary | ICD-10-CM

## 2012-10-23 NOTE — Progress Notes (Signed)
Clinic note: The patient is seen again today prior to her final treatment tomorrow. No new complaints. On inspection of her right posterior lower leg there is a confluent moist desquamation within her treatment field.  Impression: Satisfactory progress.  Plan: She'll finish her radiation therapy tomorrow. She knows to use a robotic ointment twice a day with dressing changes. I will see her for a followup visit in one week.

## 2012-10-23 NOTE — Progress Notes (Signed)
Pt states her right calf continues to be painful, edematous. She has Neosporin and Telfa dressing over area. Pt completes tx tomorrow.

## 2012-10-24 ENCOUNTER — Ambulatory Visit
Admission: RE | Admit: 2012-10-24 | Discharge: 2012-10-24 | Disposition: A | Discharge status: Home / Self Care | Payer: Medicare Other | Source: Ambulatory Visit | Attending: Radiation Oncology | Admitting: Radiation Oncology

## 2012-10-27 ENCOUNTER — Encounter: Payer: Self-pay | Admitting: Radiation Oncology

## 2012-10-27 NOTE — Progress Notes (Signed)
[facility] Radiation Oncology End of Treatment Note  Name:Carol [name]  Date: 10/27/2012 ZOX:[id number] DOB:09-03-1928   Status:outpatient    CC:  Dr. Bufford Buttner  REFERRING PHYSICIAN: Dr. Bufford Buttner   DIAGNOSIS:  Squamous cell carcinoma the skin, right posterior calf  INDICATION FOR TREATMENT: Curative   TREATMENT DATES: 09/10/2012 through 10/24/2012                          SITE/DOSE:   Right posterior calf, 6000 cGy 30 sessions                         BEAMS/ENERGY:    6 MEV electrons directed en face with 0.8 cm custom bolus to maximize the dose to the skin surface               NARRATIVE:  She tolerated treatment well with development of a moist desquamation and flattening of her carcinomas by completion of therapy.   She was instructed to apply triple and buttock ointment to her skin twice a day and cover this with Telfa.                       PLAN: Routine followup in one week. Patient instructed to call if questions or worsening complaints in interim.

## 2012-10-29 ENCOUNTER — Ambulatory Visit: Admission: RE | Admit: 2012-10-29 | Payer: Medicare Other | Source: Ambulatory Visit | Admitting: Radiation Oncology

## 2012-10-30 ENCOUNTER — Encounter: Payer: Self-pay | Admitting: Geriatric Medicine

## 2012-10-30 DIAGNOSIS — Z471 Aftercare following joint replacement surgery: Secondary | ICD-10-CM | POA: Insufficient documentation

## 2012-10-30 DIAGNOSIS — J209 Acute bronchitis, unspecified: Secondary | ICD-10-CM | POA: Insufficient documentation

## 2012-10-30 DIAGNOSIS — M1990 Unspecified osteoarthritis, unspecified site: Secondary | ICD-10-CM | POA: Insufficient documentation

## 2012-10-30 DIAGNOSIS — R32 Unspecified urinary incontinence: Secondary | ICD-10-CM | POA: Insufficient documentation

## 2012-10-30 DIAGNOSIS — M545 Low back pain: Secondary | ICD-10-CM

## 2012-10-30 DIAGNOSIS — M25559 Pain in unspecified hip: Secondary | ICD-10-CM

## 2012-10-30 DIAGNOSIS — L299 Pruritus, unspecified: Secondary | ICD-10-CM | POA: Insufficient documentation

## 2012-10-30 DIAGNOSIS — K5900 Constipation, unspecified: Secondary | ICD-10-CM | POA: Insufficient documentation

## 2012-10-30 DIAGNOSIS — E039 Hypothyroidism, unspecified: Secondary | ICD-10-CM

## 2012-10-30 DIAGNOSIS — I10 Essential (primary) hypertension: Secondary | ICD-10-CM

## 2012-10-30 DIAGNOSIS — K589 Irritable bowel syndrome without diarrhea: Secondary | ICD-10-CM | POA: Insufficient documentation

## 2012-10-30 DIAGNOSIS — G4700 Insomnia, unspecified: Secondary | ICD-10-CM | POA: Insufficient documentation

## 2012-10-30 DIAGNOSIS — R269 Unspecified abnormalities of gait and mobility: Secondary | ICD-10-CM

## 2012-10-30 DIAGNOSIS — Z96649 Presence of unspecified artificial hip joint: Secondary | ICD-10-CM

## 2012-10-30 DIAGNOSIS — K649 Unspecified hemorrhoids: Secondary | ICD-10-CM | POA: Insufficient documentation

## 2012-10-30 DIAGNOSIS — C44601 Unspecified malignant neoplasm of skin of unspecified upper limb, including shoulder: Secondary | ICD-10-CM

## 2012-10-30 DIAGNOSIS — S91009A Unspecified open wound, unspecified ankle, initial encounter: Secondary | ICD-10-CM

## 2012-10-30 DIAGNOSIS — I8390 Asymptomatic varicose veins of unspecified lower extremity: Secondary | ICD-10-CM | POA: Insufficient documentation

## 2012-10-30 DIAGNOSIS — D649 Anemia, unspecified: Secondary | ICD-10-CM | POA: Insufficient documentation

## 2012-10-30 DIAGNOSIS — R05 Cough: Secondary | ICD-10-CM

## 2012-10-30 DIAGNOSIS — R0902 Hypoxemia: Secondary | ICD-10-CM | POA: Insufficient documentation

## 2012-11-06 ENCOUNTER — Telehealth: Payer: Self-pay | Admitting: *Deleted

## 2012-11-06 NOTE — Telephone Encounter (Signed)
Returned call to patient she stated"I never got a phone call to see Dr.Murray again,he wanted to see me in 2 weeks s/p my rad txs", asked if anything else she needed,"No," so I transferred the call to Adline Mango to rechedule patient 3:25 PM

## 2012-11-11 ENCOUNTER — Ambulatory Visit
Admission: RE | Admit: 2012-11-11 | Discharge: 2012-11-11 | Disposition: A | Discharge status: Home / Self Care | Payer: Medicare Other | Source: Ambulatory Visit | Attending: Radiation Oncology | Admitting: Radiation Oncology

## 2012-11-11 VITALS — BP 149/66 | HR 78 | Temp 97.3°F | Wt 104.4 lb

## 2012-11-11 DIAGNOSIS — C801 Malignant (primary) neoplasm, unspecified: Secondary | ICD-10-CM

## 2012-11-11 NOTE — Progress Notes (Signed)
CC: Dr. Bufford Buttner  Followup note: The patient returns today approximately 3 weeks following completion of electron beam radiation therapy in the management of her squamous cell carcinomas of the skin along the  right lower posterior calf. She has been applying antibiotic ointment twice a day to her wound. She is without complaints.  Physical examination: There is granulating tissue along the lower right posterior calf. The wound is clean. There is no evidence for residual carcinoma.  Impression: Satisfactory healing. I told the patient and may take 2-4 weeks for complete reepithelialization.   Plan: Followup visit in one month.

## 2012-11-11 NOTE — Progress Notes (Signed)
Here for skin check of right calf  post completion of radiation 10/24/12.

## 2012-12-05 ENCOUNTER — Encounter: Payer: Self-pay | Admitting: Oncology

## 2012-12-09 ENCOUNTER — Ambulatory Visit
Admission: RE | Admit: 2012-12-09 | Discharge: 2012-12-09 | Disposition: A | Discharge status: Home / Self Care | Payer: Medicare Other | Source: Ambulatory Visit | Attending: Radiation Oncology | Admitting: Radiation Oncology

## 2012-12-09 ENCOUNTER — Encounter: Payer: Self-pay | Admitting: Radiation Oncology

## 2012-12-09 VITALS — BP 187/88 | HR 96 | Temp 97.7°F | Resp 20 | Wt 104.2 lb

## 2012-12-09 DIAGNOSIS — C4490 Unspecified malignant neoplasm of skin, unspecified: Secondary | ICD-10-CM

## 2012-12-09 NOTE — Progress Notes (Signed)
Patient here follow up appt for skin check on right calf, s/p rad txs12/4/13-1/17/14,  Alert,oriented x2, wound almost healed right calf, dry dressing on, patient denys pain, some fatigue, eating fair, c/o left calf also knot on front of calf, u=c/o itching both calves 4:07 PM

## 2012-12-09 NOTE — Progress Notes (Signed)
CC: Dr. Bufford Buttner, [facility]  Followup note:  Carol [name] visits today approximately 6 weeks following completion of electron beam radiation therapy to her right posterior calf the management of her squamous cell carcinoma of the skin. She had multifocal disease. She is without complaints today except for a new "nodule" along her left shin. She does report minimal drainage from her right posterior calf at the side of her radiation therapy field. She tells me she is scheduled for a "total body skin check" in April with Dr. Leonie Man.  Physical examination: Alert and oriented.  Wt Readings from Last 3 Encounters:  12/09/12 104 lb 3.2 oz (47.265 kg)  11/11/12 104 lb 6.4 oz (47.356 kg)  10/20/12 104 lb 1.6 oz (47.219 kg)   Temp Readings from Last 3 Encounters:  12/09/12 97.7 F (36.5 C) Oral  11/11/12 97.3 F (36.3 C)   10/23/12 98.1 F (36.7 C) Oral   BP Readings from Last 3 Encounters:  12/09/12 187/88  11/11/12 149/66  10/23/12 121/62   Pulse Readings from Last 3 Encounters:  12/09/12 96  11/11/12 78  10/23/12 85   On inspection of the right posterior calf or 2 areas of granulating tissue one measuring proximal 0.8 cm in size and the other just a few millimeters. She appears to have had a good response thus far. Along the left anteromedial shin is a 1.0 cm nodule with some overlying keratosis. This could represent a skin cancer.  Impression: Satisfactory response thus far to her treated right posterior calf. She may continue to apply antibiotic ointment to areas of granulating tissue. I told her to bring her left shin nodule to the attention of Dr.Stinehelfer. Ideally, this should be treated by excision, but if she is not a candidate for surgery then she could be treated with radiation therapy with only 10 fractions of treatment.  Plan: Followup visit with me in 2 months.

## 2012-12-29 ENCOUNTER — Other Ambulatory Visit: Payer: Self-pay | Admitting: Internal Medicine

## 2012-12-30 ENCOUNTER — Non-Acute Institutional Stay (INDEPENDENT_AMBULATORY_CARE_PROVIDER_SITE_OTHER): Payer: Medicare Other | Admitting: Internal Medicine

## 2012-12-30 ENCOUNTER — Encounter: Payer: Self-pay | Admitting: Internal Medicine

## 2012-12-30 VITALS — BP 120/72 | HR 68 | Temp 97.2°F | Ht 63.0 in | Wt 102.0 lb

## 2012-12-30 DIAGNOSIS — R269 Unspecified abnormalities of gait and mobility: Secondary | ICD-10-CM

## 2012-12-30 DIAGNOSIS — M1990 Unspecified osteoarthritis, unspecified site: Secondary | ICD-10-CM

## 2012-12-30 DIAGNOSIS — R32 Unspecified urinary incontinence: Secondary | ICD-10-CM

## 2012-12-30 DIAGNOSIS — I10 Essential (primary) hypertension: Secondary | ICD-10-CM

## 2012-12-30 DIAGNOSIS — E039 Hypothyroidism, unspecified: Secondary | ICD-10-CM

## 2012-12-30 DIAGNOSIS — I8390 Asymptomatic varicose veins of unspecified lower extremity: Secondary | ICD-10-CM

## 2012-12-30 NOTE — Patient Instructions (Signed)
Continue current medications. 

## 2012-12-30 NOTE — Progress Notes (Signed)
Date: 12/30/2012  MRN:  [id number] Name:  Carol [name] Sex:  female Age:  79 y.o. DOB:January 07, 1928   [facility] #:    [id number]              Facility/Room; [facility]  Level Of Care:independent living Provider: A. Jabar Krysiak  Emergency Contacts: Contact Information   Name Relation Home Work Mobile   Amasa Other [phone]        Code Status: DO NOT RESUSCITATE, living will MOST Form: Has been completed  Allergies:No Known Allergies   Chief Complaint  Patient presents with  . Annual Exam  . Medical Managment of Chronic Issues    blood pressure, anemia, thyroid     HPI:  Past Medical History  Diagnosis Date  . AK (actinic keratosis)     right tip of nose and right posterior leg  . Cancer 08/07/12    squamous cell ca,keratoacanthoma-right posterior leg  . Skin cancer 06/2011    scc right elbow tx included excision  . Radiation 09/10/2012-10/24/2012    30 fractions to right posterior calf  . Thyroid disease   . Anemia   . Hypertension   . Varicose vein of leg   . Hemorrhoids   . Spastic colon   . Insomnia, unspecified   . Unspecified urinary incontinence   . Hypoxemia   . Unspecified hypothyroidism   . Abnormality of gait   . Arthritis   . Osteoarthrosis, unspecified whether generalized or localized, unspecified site     Past Surgical History  Procedure Laterality Date  . Abdominal hysterectomy      early 40's, abdominal  . Appendectomy    . Anterior heel repair    . Total hip arthroplasty  07/31/2010    right  . Pheochromocytoma      left  . Fracture surgery  2003    left   . Wrist fracture surgery  2003    left Dr. Mikael Spray     Procedures: Chest x-ray negative Cryotherapy to lesions on the right leg and left shin  Consultants: [facility] Dentist: Dr. Martie Round Ortho: Dr. Deon Pilling. surgery: Dr. Alto Denver for removal of left pheochromocytoma GYN: Eliot Ford. [place]: Dr. Jacky Kindle was primary care physician in the  past  Current Outpatient Prescriptions  Medication Sig Dispense Refill  . beta carotene w/minerals (OCUVITE) tablet Take 1 tablet by mouth daily. For eyes      . diphenhydrAMINE-zinc acetate (BENADRYL) cream Apply as needed for itch      . Omega-3 Fatty Acids (FISH OIL) 1000 MG CAPS Take by mouth. Take one capsule a day      . OVER THE COUNTER MEDICATION Apply 1 drop topically daily. i-cap gtts in each eye daily      . polyethylene glycol (MIRALAX / GLYCOLAX) packet Take 17 g by mouth daily as needed.       Marland Kitchen emollient (BIAFINE) cream Apply 1 application topically daily. Use after daily rad txs and prn      . Multiple Vitamin (MULTIVITAMIN) tablet Take 1 tablet by mouth daily. Take one tablet once a day      . OCCULT BLOOD TEST DEVELOPER VI by In Vitro route.      Marland Kitchen OVER THE COUNTER MEDICATION Take 1 tablet by mouth daily. otc for cholestrol       No current facility-administered medications for this visit.    Immunization History  Administered Date(s) Administered  . Pneumococcal Polysaccharide 10/08/1998  . Td 10/08/2005  . Zoster  10/08/2005     Diet:  History  Substance Use Topics  . Smoking status: Former Smoker -- 0.50 packs/day for 10 years    Types: Cigarettes    Quit date: 10/30/1978  . Smokeless tobacco: Never Used  . Alcohol Use: 1.8 oz/week    3 Glasses of wine per week    Family History  Problem Relation Age of Onset  . Stroke Mother   . Stroke Father     ROS Constitutional: negative Eyes: negative for glaucoma, irritation and visual disturbance Ears, nose, mouth, throat, and face: positive for hearing loss Respiratory: positive for dyspnea on exertion Cardiovascular: negative for chest pressure/discomfort, dyspnea, irregular heart beat, lower extremity edema, palpitations and syncope Gastrointestinal: positive for constipation, negative for diarrhea and nausea Genitourinary:negative for dysuria and nocturia. Has noted urinary leakage. Denies discomfort  with this. Integument/breast: positive for pruritus, negative for nipple discharge, rash and skin color change Hematologic/lymphatic: negative for bleeding, lymphadenopathy and petechiae Musculoskeletal:positive for arthralgias, muscle weakness, myalgias and neck pain, negative for bone pain Neurological: positive for gait problems, memory problems and weakness, negative for headaches, paresthesia, seizures, speech problems and vertigo. Occasional sleep disturbances Behavioral/Psych: positive for fatigue and mood swings, negative for aggressive behavior, decreased appetite, excessive alcohol consumption, increased appetite, loss of interest in favorite activities and phobia   PE Vital signs: BP 120/72  Pulse 68  Temp(Src) 97.2 F (36.2 C)  Ht 5\' 3"  (1.6 m)  Wt 102 lb (46.267 kg)  BMI 18.07 kg/m2  General Appearance:    Alert, cooperative, no distress, appears stated age  Head:    Normocephalic, without obvious abnormality, atraumatic  Eyes:    PERRL, conjunctiva/corneas clear, EOM's intact, fundi    benign, both eyes  Ears:    Normal TM's and external ear canals, both ears  Nose:   Nares normal, septum midline, mucosa normal, no drainage    or sinus tenderness  Throat:   Lips, mucosa, and tongue normal; teeth and gums normal  Neck:   Supple, symmetrical, trachea midline, no adenopathy;    thyroid:  no enlargement/tenderness/nodules; no carotid   bruit or JVD  Back:     Symmetric, no curvature, ROM normal, no CVA tenderness  Lungs:     Clear to auscultation bilaterally, respirations unlabored  Chest Wall:    No tenderness or deformity   Heart:    Regular rate and rhythm, S1 and S2 normal, no murmur, rub   or gallop  Breast Exam:    No tenderness, masses, or nipple abnormality  Abdomen:     Soft, non-tender, bowel sounds active all four quadrants,    no masses, no organomegaly  Genitalia:    refused by patient   Rectal:    refused by patient   Extremities:   Extremities normal,  atraumatic, no cyanosis or edema  Pulses:   2+ and symmetric all extremities  Skin:   Skin color, texture, turgor normal, no rashes.diffuse ecchymoses. Chronic changes in the lower legs consistent with stasis dermatitis. Healing area of the right posterior calf from treatment of squamous cell cancer. New lesion left anterior shin approximately 3-4 mm in diameter and quite firm. It is painless. It is suggestive of squamous cell cancer.   Lymph nodes:   Cervical, supraclavicular, and axillary nodes normal  Neurologic:   CNII-XII intact, generalized weakness.Gait is very wobbly and unsteady. Loss of vibratory sensation in both great toes.     Screening Score  MMS    PHQ2 2  PHQ9  3  Fall Risk    BIMS    Annual summary: Hospitalizations: None  Problem List as of 12/30/2012     ICD-9-CM     Cardiovascular and Mediastinum   Unspecified essential hypertension   Varicose veins of lower extremity   Hemorrhoids     Respiratory   Acute bronchitis     Digestive   Unspecified constipation   Irritable bowel syndrome     Endocrine   Unspecified hypothyroidism     Musculoskeletal and Integument   AK (actinic keratosis)   Open wound of knee, leg (except thigh), and ankle, without mention of complication   Unspecified malignant neoplasm of skin of upper limb, including shoulder   Unspecified pruritic disorder   Osteoarthrosis, unspecified whether generalized or localized, unspecified site   Squamous cell carcinoma of R post calf     Other   Cancer   Lumbago   Abnormality of gait   Cough   Pain in joint, pelvic region and thigh   Hip joint replacement by other means   Anemia, unspecified   Insomnia, unspecified   Unspecified urinary incontinence   Hypoxemia   Aftercare following joint replacement     Infection History: none Functional assessment:independent in eating, dressing, toilet, and bathing. Must use walker. Areas of potential improvement: NA Rehabilitation  Potential:NA Prognosis for survival:good  Plan:  Place on left shin suggestive of squamous cell cancer: Patient is under treatment by dermatologist. 244.9  Hypothyroidism: Controlled 401.9 Hypertension: Controlled 454.0  Varicose veins of lower extremities: Extensive and accompanied by stasis dermatitis. Painless. No additional treatment planned. 715.90 Osteoarthritis: No change in therapy 788.30 Urinary incontinence: No change in therapy 790.6 glucose intolerance: Avoid sweets

## 2013-01-03 ENCOUNTER — Other Ambulatory Visit: Payer: Self-pay | Admitting: Internal Medicine

## 2013-01-13 ENCOUNTER — Encounter: Payer: Self-pay | Admitting: Internal Medicine

## 2013-02-09 ENCOUNTER — Encounter: Payer: Self-pay | Admitting: Radiation Oncology

## 2013-02-10 ENCOUNTER — Ambulatory Visit: Payer: Medicare Other | Admitting: Radiation Oncology

## 2013-02-10 ENCOUNTER — Ambulatory Visit
Admission: RE | Admit: 2013-02-10 | Discharge: 2013-02-10 | Disposition: A | Discharge status: Home / Self Care | Payer: Medicare Other | Source: Ambulatory Visit | Attending: Radiation Oncology | Admitting: Radiation Oncology

## 2013-02-10 VITALS — BP 159/86 | HR 80 | Temp 97.9°F | Ht 63.0 in | Wt 103.2 lb

## 2013-02-10 DIAGNOSIS — C4490 Unspecified malignant neoplasm of skin, unspecified: Secondary | ICD-10-CM

## 2013-02-10 NOTE — Progress Notes (Addendum)
Carol [name] here for follow up after 30 fractions to her right calf.  She does have occasional, sharp pains in her lower back.  She denies fatigue.  The skin on her right calf is pink and she does have two scabbed areas.  She is applying Cerve lotion to it daily.

## 2013-02-10 NOTE — Progress Notes (Signed)
CC Dr. Bufford Buttner  Followup note:  Carol [name] returns today approximately 3 and one half months following completion of electron beam radiation therapy to her right posterior calf in the management of her multifocal squamous cell carcinomas. She is without complaints today. She has an appointment to see Dr. Leonie Man for a total body skin check in one year.  Physical examination: On inspection of the posterior right calf where she had her treatment there are 2 remaining scabs, both measuring less than 1 cm in size. No other suspicious areas appreciated.  Impression: Slow healing following electron beam radiation therapy for squamous cell carcinoma.  Plan: I told the patient that it may take a few more months to see complete reepithelialization. I gave her Aquaphor ointment to soften upper scabs. Followup visit with me in approximately 3 months.

## 2013-02-17 ENCOUNTER — Ambulatory Visit: Payer: Medicare Other | Admitting: Radiation Oncology

## 2013-03-28 ENCOUNTER — Other Ambulatory Visit: Payer: Self-pay | Admitting: Internal Medicine

## 2013-04-21 ENCOUNTER — Non-Acute Institutional Stay: Payer: Medicare Other | Admitting: Internal Medicine

## 2013-04-21 ENCOUNTER — Encounter: Payer: Self-pay | Admitting: Internal Medicine

## 2013-04-21 VITALS — BP 122/80 | HR 84 | Temp 97.1°F | Ht 63.0 in | Wt 101.0 lb

## 2013-04-21 DIAGNOSIS — K5900 Constipation, unspecified: Secondary | ICD-10-CM

## 2013-04-21 MED ORDER — MAGNESIUM HYDROXIDE 400 MG/5ML PO SUSP: ORAL | Status: DC

## 2013-04-21 NOTE — Patient Instructions (Signed)
Add MOM to Miralax

## 2013-04-21 NOTE — Progress Notes (Signed)
  Subjective:    Patient ID: Carol [name], female    DOB: May 19, 1928, 79 y.o.   MRN: [id number]  HPI Constipated for two weeks. Using Miralax every day. No helping. Wants to use Vear Clock MOM and other OTC Meds. No ab pain. No blood in the stool. No nausea.  Current Outpatient Prescriptions on File Prior to Visit  Medication Sig Dispense Refill  . beta carotene w/minerals (OCUVITE) tablet Take 1 tablet by mouth daily. For eyes      . diphenhydrAMINE-zinc acetate (BENADRYL) cream Apply as needed for itch      . emollient (BIAFINE) cream Apply 1 application topically daily. Use after daily rad txs and prn      . Multiple Vitamin (MULTIVITAMIN) tablet Take 1 tablet by mouth daily. Take one tablet once a day      . Omega-3 Fatty Acids (FISH OIL) 1000 MG CAPS Take by mouth. Take one capsule a day      . OVER THE COUNTER MEDICATION Apply 1 drop topically daily. i-cap gtts in each eye daily      . OVER THE COUNTER MEDICATION Take 1 tablet by mouth daily. otc for cholestrol      . polyethylene glycol (MIRALAX / GLYCOLAX) packet Take 17 g by mouth daily as needed.            Review of Systems  Constitutional: Negative for chills, diaphoresis and appetite change.  HENT: Negative.   Eyes: Negative.   Respiratory: Negative.   Cardiovascular: Negative for chest pain, palpitations and leg swelling.  Gastrointestinal: Positive for constipation. Negative for nausea, abdominal pain, diarrhea, blood in stool, abdominal distention and rectal pain.  Musculoskeletal: Positive for gait problem.  Skin:       Chronic changes of lower legs with darkening of the skin. Hx SCC of lower leg.  Neurological: Negative.   Hematological: Negative.   Psychiatric/Behavioral: Negative.        Objective:BP 122/80  Pulse 84  Temp(Src) 97.1 F (36.2 C) (Oral)  Ht 5\' 3"  (1.6 m)  Wt 101 lb (45.813 kg)  BMI 17.9 kg/m2    Physical Exam  Constitutional: She is oriented to person, place, and time.  frail  Neck: No JVD  present. No tracheal deviation present. No thyromegaly present.  Cardiovascular: Normal rate, regular rhythm, normal heart sounds and intact distal pulses.  Exam reveals no gallop and no friction rub.   No murmur heard. Pulmonary/Chest: Breath sounds normal. No respiratory distress. She has no wheezes. She exhibits no tenderness.  Abdominal: Bowel sounds are normal. She exhibits no distension and no mass. There is no tenderness. There is no rebound.  Musculoskeletal: Normal range of motion. She exhibits no edema and no tenderness.  Unstable gait. Using 4 wheel walker with brakes and seat.  Lymphadenopathy:    She has no cervical adenopathy.  Neurological: She is alert and oriented to person, place, and time. A cranial nerve deficit is present. Coordination normal.  Memory loss  Skin: No rash noted. No erythema. No pallor.  Psychiatric: She has a normal mood and affect. Her behavior is normal. Judgment and thought content normal.      Assessment & Plan:  Unspecified constipation: add MO 30-60 ml daily to Miralax to relieve constipation.

## 2013-04-24 ENCOUNTER — Ambulatory Visit (INDEPENDENT_AMBULATORY_CARE_PROVIDER_SITE_OTHER): Payer: Medicare Other | Admitting: Internal Medicine

## 2013-04-24 ENCOUNTER — Ambulatory Visit: Payer: Medicare Other

## 2013-04-24 VITALS — BP 124/72 | HR 94 | Temp 98.1°F | Resp 17 | Ht 63.5 in | Wt 101.0 lb

## 2013-04-24 DIAGNOSIS — M25532 Pain in left wrist: Secondary | ICD-10-CM

## 2013-04-24 DIAGNOSIS — S63599A Other specified sprain of unspecified wrist, initial encounter: Secondary | ICD-10-CM

## 2013-04-24 DIAGNOSIS — M25539 Pain in unspecified wrist: Secondary | ICD-10-CM

## 2013-04-24 NOTE — Patient Instructions (Signed)
This wrist sprain will need to be protected with a brace when you are up and around for the next 3 weeks you may take the brace off at any point to do range of motion exercises with the wrist in order to prevent stiffness you may take Tylenol if the wrist aches Recheck if not well in 3 weeks

## 2013-04-24 NOTE — Progress Notes (Signed)
  Subjective:    Patient ID: Carol [name], female    DOB: 09-15-1928, 79 y.o.   MRN: [id number]  [facility] self w/ L hand yesterday Now swollen/pain w/ use  Prior fx this wrist  Reviewed problem list Reviewed medications  Review of Systems     Objective:   Physical Exam BP 124/72  Pulse 94  Temp(Src) 98.1 F (36.7 C) (Oral)  Resp 17  Ht 5' 3.5" (1.613 m)  Wt 101 lb (45.813 kg)  BMI 17.61 kg/m2  SpO2 92% No acute distress Left wrist is mildly swollen Ecchymoses dorsal Tender to palpation over the carpals and distal radius Range of motion is fair though causes pain Snuffbox negative      [facility] reading (PRIMARY) by  Dr.Doolittle= osteoporosis evident/has history of old wrist fracture/no new fracture seen   Assessment & Plan:  Wrist spurring causing pending  Continue range of motion exercises Thumb spica splint Recheck 3 weeks

## 2013-05-11 ENCOUNTER — Other Ambulatory Visit: Payer: Self-pay | Admitting: Internal Medicine

## 2013-05-12 ENCOUNTER — Ambulatory Visit
Admission: RE | Admit: 2013-05-12 | Discharge: 2013-05-12 | Disposition: A | Discharge status: Home / Self Care | Payer: Medicare Other | Source: Ambulatory Visit | Attending: Radiation Oncology | Admitting: Radiation Oncology

## 2013-05-12 VITALS — BP 164/88 | HR 80 | Temp 97.8°F | Ht 63.5 in | Wt 101.6 lb

## 2013-05-12 DIAGNOSIS — C4490 Unspecified malignant neoplasm of skin, unspecified: Secondary | ICD-10-CM

## 2013-05-12 NOTE — Progress Notes (Signed)
Carol [name] here for follow up after treatment to her right calf.  She does have pain in her lower back and weakness in her lower legs.  She is unable to rate the pain.  She is very unsteady and needs to hold on to something when she walks.  She reports that she has been to 3 doctors for this but they can't give her an answer as to why she is unsteady.  The skin on her right calf has hyperpigmentation.  She reports the skin is dry and peeling.  She is using caraway lotion.

## 2013-05-12 NOTE — Progress Notes (Signed)
CC: Dr. Bufford Buttner  Followup note: The patient returns today approximately 7 months following completion of electron beam radiation therapy in the management of her squamous cell carcinomas of the skin arising from the right posterior calf. She is without complaints today except for chronic low back pain. No current dermatologic issues.  Physical examination: Alert and oriented. Filed Vitals:   05/12/13 1023  BP: 164/88  Pulse: 80  Temp: 97.8 F (36.6 C)   On inspection of the right posterior calf there is no evidence for recurrent/persistent disease. There has been complete reepithelialization of her skin. Severe actinic changes are again noted along her lower extremities.  Impression: Satisfactory progress with no evidence for recurrent disease along her right posterior calf.  Plan: I told the patient that she needs to have routine dermatologic followup with Dr. Leonie Man. I've not scheduled the patient for a formal followup visit in knowing that she will have routine dermatologic followup.

## 2013-06-02 ENCOUNTER — Encounter: Payer: Self-pay | Admitting: Internal Medicine

## 2013-06-02 ENCOUNTER — Non-Acute Institutional Stay: Payer: Medicare Other | Admitting: Internal Medicine

## 2013-06-02 VITALS — BP 122/72 | HR 62 | Temp 96.4°F | Ht 63.5 in | Wt 100.0 lb

## 2013-06-02 DIAGNOSIS — J209 Acute bronchitis, unspecified: Secondary | ICD-10-CM | POA: Insufficient documentation

## 2013-06-02 MED ORDER — AZITHROMYCIN 500 MG PO TABS: ORAL_TABLET | ORAL | Status: DC

## 2013-06-02 MED ORDER — GUAIFENESIN ER 1200 MG PO TB12: ORAL_TABLET | ORAL | Status: DC

## 2013-06-02 NOTE — Patient Instructions (Signed)
Use medication as directed. 

## 2013-06-02 NOTE — Progress Notes (Signed)
  Subjective:    Patient ID: Carol [name], female    DOB: 15-Jul-1928, 79 y.o.   MRN: [id number]  HPI Cough for 2 weeks. Sometimes coughs up a small amount of creamy colored sticky phlegm.  Hurting in the left back laterally in the area of lungs.  Current Outpatient Prescriptions on File Prior to Visit  Medication Sig Dispense Refill  . beta carotene w/minerals (OCUVITE) tablet Take 1 tablet by mouth daily. For eyes      . emollient (BIAFINE) cream Apply 1 application topically daily. Use after daily rad txs and prn      . magnesium hydroxide (MILK OF MAGNESIA) 400 MG/5ML suspension 30 to 60 ml daily for laxative  360 mL  0  . Omega-3 Fatty Acids (FISH OIL) 1000 MG CAPS Take by mouth. Take one capsule a day      . OVER THE COUNTER MEDICATION Apply 1 drop topically daily. i-cap gtts in each eye daily      . polyethylene glycol (MIRALAX / GLYCOLAX) packet Take 17 g by mouth daily as needed.        No current facility-administered medications on file prior to visit.    Review of Systems  Constitutional: Negative for chills, diaphoresis and appetite change.  HENT: Negative.   Eyes: Negative.   Respiratory: Positive for cough.   Cardiovascular: Negative for chest pain, palpitations and leg swelling.  Gastrointestinal: Positive for constipation. Negative for nausea, abdominal pain, diarrhea, blood in stool, abdominal distention and rectal pain.  Genitourinary: Negative.   Musculoskeletal: Positive for gait problem.       Using 4 wheel walker with seat.  Skin:       Chronic changes of lower legs with darkening of the skin. Hx SCC of lower leg.  Neurological: Negative.   Hematological: Negative.   Psychiatric/Behavioral: Negative.        Objective:BP 122/72  Pulse 62  Temp(Src) 96.4 F (35.8 C) (Oral)  Ht 5' 3.5" (1.613 m)  Wt 100 lb (45.36 kg)  BMI 17.43 kg/m2    Physical Exam  Constitutional: She is oriented to person, place, and time.  frail  Neck: No JVD present. No tracheal  deviation present. No thyromegaly present.  Cardiovascular: Normal rate, regular rhythm, normal heart sounds and intact distal pulses.  Exam reveals no gallop and no friction rub.   No murmur heard. Pulmonary/Chest: No respiratory distress. She has no wheezes. She has rales. She exhibits no tenderness.  Rales in the left chest laterally.  Abdominal: Bowel sounds are normal. She exhibits no distension and no mass. There is no tenderness. There is no rebound.  Musculoskeletal: Normal range of motion. She exhibits no edema and no tenderness.  Unstable gait. Using 4 wheel walker with brakes and seat.  Lymphadenopathy:    She has no cervical adenopathy.  Neurological: She is alert and oriented to person, place, and time. A cranial nerve deficit is present. Coordination normal.  Memory loss  Skin: No rash noted. No erythema. No pallor.  Psychiatric: She has a normal mood and affect. Her behavior is normal. Judgment and thought content normal.          Assessment & Plan:  Acute bronchitis - Plan: azithromycin (ZITHROMAX) 500 MG tablet, Guaifenesin 1200 MG TB12

## 2013-06-30 ENCOUNTER — Encounter: Payer: Self-pay | Admitting: Internal Medicine

## 2013-06-30 ENCOUNTER — Non-Acute Institutional Stay: Payer: Medicare Other | Admitting: Internal Medicine

## 2013-06-30 VITALS — BP 146/82 | HR 62 | Ht 63.5 in | Wt 101.0 lb

## 2013-06-30 DIAGNOSIS — I8390 Asymptomatic varicose veins of unspecified lower extremity: Secondary | ICD-10-CM

## 2013-06-30 DIAGNOSIS — R269 Unspecified abnormalities of gait and mobility: Secondary | ICD-10-CM

## 2013-06-30 DIAGNOSIS — L299 Pruritus, unspecified: Secondary | ICD-10-CM

## 2013-06-30 DIAGNOSIS — M545 Low back pain: Secondary | ICD-10-CM

## 2013-06-30 DIAGNOSIS — E039 Hypothyroidism, unspecified: Secondary | ICD-10-CM

## 2013-06-30 DIAGNOSIS — I10 Essential (primary) hypertension: Secondary | ICD-10-CM

## 2013-06-30 NOTE — Patient Instructions (Signed)
Continue current vitamins

## 2013-06-30 NOTE — Progress Notes (Signed)
  Subjective:    Patient ID: Carol [name], female    DOB: 02-Jul-1928, 79 y.o.   MRN: [id number]  HPI Unspecified essential hypertension: controlled  Unspecified hypothyroidism: no recent lab  Varicose veins of lower extremity: chronic stasis changes of both legs  Abnormality of gait: walkeer  Lumbago: mmild and chronic. No change.  Unspecified pruritic disorder: mild. Not using anything at present.    Current Outpatient Prescriptions on File Prior to Visit  Medication Sig Dispense Refill  . beta carotene w/minerals (OCUVITE) tablet Take 1 tablet by mouth daily. For eyes      . emollient (BIAFINE) cream Apply 1 application topically daily. Use after daily rad txs and prn      . Omega-3 Fatty Acids (FISH OIL) 1000 MG CAPS Take by mouth. Take one capsule a day      . OVER THE COUNTER MEDICATION Apply 1 drop topically daily. i-cap gtts in each eye daily      . polyethylene glycol (MIRALAX / GLYCOLAX) packet Take 17 g by mouth daily as needed.        No current facility-administered medications on file prior to visit.    Review of Systems  Constitutional: Negative for chills, diaphoresis and appetite change.  HENT: Negative.   Eyes: Negative.   Respiratory: Positive for cough.   Cardiovascular: Negative for chest pain, palpitations and leg swelling.  Gastrointestinal: Positive for constipation. Negative for nausea, abdominal pain, diarrhea, blood in stool, abdominal distention and rectal pain.  Genitourinary: Negative.   Musculoskeletal: Positive for gait problem.       Using 4 wheel walker with seat.  Skin:       Chronic changes of lower legs with darkening of the skin. Hx SCC of lower leg. Chronic itching  Neurological: Negative.   Hematological: Negative.   Psychiatric/Behavioral: Negative.        Objective:BP 146/82  Pulse 62  Ht 5' 3.5" (1.613 m)  Wt 101 lb (45.813 kg)  BMI 17.61 kg/m2    Physical Exam  Constitutional: She is oriented to person, place, and time.   frail  Neck: No JVD present. No tracheal deviation present. No thyromegaly present.  Cardiovascular: Normal rate, regular rhythm, normal heart sounds and intact distal pulses.  Exam reveals no gallop and no friction rub.   No murmur heard. Pulmonary/Chest: No respiratory distress. She has no wheezes. She has rales. She exhibits no tenderness.  Rales in the left chest laterally.  Abdominal: Bowel sounds are normal. She exhibits no distension and no mass. There is no tenderness. There is no rebound.  Musculoskeletal: Normal range of motion. She exhibits no edema and no tenderness.  Unstable gait. Using 4 wheel walker with brakes and seat.  Lymphadenopathy:    She has no cervical adenopathy.  Neurological: She is alert and oriented to person, place, and time. No cranial nerve deficit. Coordination normal.  Memory loss  Skin: No rash noted. No erythema. No pallor.  Psychiatric: She has a normal mood and affect. Her behavior is normal. Judgment and thought content normal.          Assessment & Plan:  Unspecified essential hypertension: stable  Unspecified hypothyroidism: repeat lab at next visit  Varicose veins of lower extremity: she is planning to see dermatologist  Abnormality of gait: continue use of walker  Lumbago: unchanged.  Unspecified pruritic disorder: unchanged

## 2013-08-04 ENCOUNTER — Non-Acute Institutional Stay: Payer: Medicare Other | Admitting: Internal Medicine

## 2013-08-04 ENCOUNTER — Encounter: Payer: Self-pay | Admitting: Internal Medicine

## 2013-08-04 VITALS — BP 136/82 | HR 64 | Ht 63.5 in | Wt 104.0 lb

## 2013-08-04 DIAGNOSIS — I872 Venous insufficiency (chronic) (peripheral): Secondary | ICD-10-CM

## 2013-08-04 DIAGNOSIS — C44599 Other specified malignant neoplasm of skin of other part of trunk: Secondary | ICD-10-CM

## 2013-08-04 DIAGNOSIS — I8310 Varicose veins of unspecified lower extremity with inflammation: Secondary | ICD-10-CM

## 2013-08-04 DIAGNOSIS — C44509 Unspecified malignant neoplasm of skin of other part of trunk: Secondary | ICD-10-CM

## 2013-08-04 NOTE — Patient Instructions (Signed)
Continue current medications. Use Aquaphor on legs daily. See Dr. Tajanay Limbo.

## 2013-08-04 NOTE — Progress Notes (Signed)
  Subjective:    Patient ID: Carol [name], female    DOB: Oct 15, 1927, 79 y.o.   MRN: [id number]  Chief Complaint  Patient presents with  . Mass    right upper back for 2 weeks, some drainage. Both legs itchs.    HPI  Legs are irritated and she feels like scratching them all the time. Has known venous stasis dermatitis.  "Cyst" at scapula broke open and then closed up and then broke open again. Hasa seen Dr. Kilyn Limbo in the past.  Review of Systems  Constitutional: Negative for chills, diaphoresis and appetite change.  HENT: Negative.   Eyes: Negative.   Respiratory: Positive for cough.   Cardiovascular: Negative for chest pain, palpitations and leg swelling.  Gastrointestinal: Positive for constipation. Negative for nausea, abdominal pain, diarrhea, blood in stool, abdominal distention and rectal pain.  Genitourinary: Negative.   Musculoskeletal: Positive for gait problem.       Using 4 wheel walker with seat.  Skin:       Chronic changes of lower legs with darkening of the skin. Hx SCC of lower leg. Chronic itching. New lesion of the left scapula.  Neurological: Negative.   Hematological: Negative.   Psychiatric/Behavioral: Negative.        Objective:BP 136/82  Pulse 64  Ht 5' 3.5" (1.613 m)  Wt 104 lb (47.174 kg)  BMI 18.13 kg/m2    Physical Exam  Constitutional: She is oriented to person, place, and time.  frail  Neck: No JVD present. No tracheal deviation present. No thyromegaly present.  Cardiovascular: Normal rate, regular rhythm, normal heart sounds and intact distal pulses.  Exam reveals no gallop and no friction rub.   No murmur heard. Pulmonary/Chest: No respiratory distress. She has no wheezes. She has rales. She exhibits no tenderness.  Rales in the left chest laterally.  Abdominal: Bowel sounds are normal. She exhibits no distension and no mass. There is no tenderness. There is no rebound.  Musculoskeletal: Normal range of motion. She exhibits no edema and  no tenderness.  Unstable gait. Using 4 wheel walker with brakes and seat.  Lymphadenopathy:    She has no cervical adenopathy.  Neurological: She is alert and oriented to person, place, and time. No cranial nerve deficit. Coordination normal.  Memory loss  Skin: No rash noted. No erythema. No pallor.  1.5 cm circumference lesion over the left scapula. Central ulceration. I cannot say if this is a cancer or simple a complex cystic structure, but i think a skin cancer is more likely. Chronic venous stasis changes of both lower legs. Scaling of skin.  Psychiatric: She has a normal mood and affect. Her behavior is normal. Judgment and thought content normal.          Assessment & Plan:  Skin cancer of trunk: refer to Dr. Ronee Limbo for removal  Venous stasis dermatitis, unspecified laterality: try Aquaphor daily.

## 2013-10-13 ENCOUNTER — Other Ambulatory Visit: Payer: Self-pay | Admitting: *Deleted

## 2013-10-13 MED ORDER — POLYETHYLENE GLYCOL 3350 17 G PO PACK: 17.0000 g | PACK | Freq: Every day | ORAL | Status: DC | PRN

## 2013-10-20 ENCOUNTER — Telehealth: Payer: Self-pay

## 2013-10-20 NOTE — Telephone Encounter (Signed)
Therese the clinic nurse wanted Dr. Nyoka Cowden to know that patient may need to go to [place], but not at this time. Recently Mrs. Skeplo has asked help to write checks, how to work the shower, help with meal tickets. Confused -tells one person about moving to [place], then next day doesn't remember saying that. [place], she told them they weren't, they mailed papers to her to sign to removed them from being her HCPOA, but she hasn't signed them. As of now patient is doing find per clinic nurse

## 2013-11-04 ENCOUNTER — Inpatient Hospital Stay (HOSPITAL_COMMUNITY)
Admission: AD | Admit: 2013-11-04 | Discharge: 2013-11-06 | DRG: 280 | Disposition: A | Discharge status: Skilled Nursing Facility | Payer: Medicare Other | Attending: Internal Medicine | Admitting: Internal Medicine

## 2013-11-04 ENCOUNTER — Emergency Department (HOSPITAL_COMMUNITY): Payer: Medicare Other

## 2013-11-04 ENCOUNTER — Encounter (HOSPITAL_COMMUNITY): Payer: Self-pay | Admitting: Emergency Medicine

## 2013-11-04 DIAGNOSIS — D3500 Benign neoplasm of unspecified adrenal gland: Secondary | ICD-10-CM | POA: Diagnosis present

## 2013-11-04 DIAGNOSIS — Z681 Body mass index (BMI) 19 or less, adult: Secondary | ICD-10-CM

## 2013-11-04 DIAGNOSIS — T1490XA Injury, unspecified, initial encounter: Secondary | ICD-10-CM | POA: Diagnosis present

## 2013-11-04 DIAGNOSIS — L89899 Pressure ulcer of other site, unspecified stage: Secondary | ICD-10-CM | POA: Diagnosis present

## 2013-11-04 DIAGNOSIS — E860 Dehydration: Secondary | ICD-10-CM

## 2013-11-04 DIAGNOSIS — R627 Adult failure to thrive: Secondary | ICD-10-CM | POA: Diagnosis present

## 2013-11-04 DIAGNOSIS — Z823 Family history of stroke: Secondary | ICD-10-CM

## 2013-11-04 DIAGNOSIS — Z96649 Presence of unspecified artificial hip joint: Secondary | ICD-10-CM

## 2013-11-04 DIAGNOSIS — L8990 Pressure ulcer of unspecified site, unspecified stage: Secondary | ICD-10-CM | POA: Diagnosis present

## 2013-11-04 DIAGNOSIS — Z85828 Personal history of other malignant neoplasm of skin: Secondary | ICD-10-CM

## 2013-11-04 DIAGNOSIS — Z923 Personal history of irradiation: Secondary | ICD-10-CM

## 2013-11-04 DIAGNOSIS — Z66 Do not resuscitate: Secondary | ICD-10-CM | POA: Diagnosis present

## 2013-11-04 DIAGNOSIS — I214 Non-ST elevation (NSTEMI) myocardial infarction: Secondary | ICD-10-CM

## 2013-11-04 DIAGNOSIS — M6282 Rhabdomyolysis: Secondary | ICD-10-CM | POA: Diagnosis present

## 2013-11-04 DIAGNOSIS — Z87891 Personal history of nicotine dependence: Secondary | ICD-10-CM

## 2013-11-04 DIAGNOSIS — L89209 Pressure ulcer of unspecified hip, unspecified stage: Secondary | ICD-10-CM | POA: Diagnosis present

## 2013-11-04 DIAGNOSIS — E039 Hypothyroidism, unspecified: Secondary | ICD-10-CM | POA: Diagnosis present

## 2013-11-04 DIAGNOSIS — I10 Essential (primary) hypertension: Secondary | ICD-10-CM | POA: Diagnosis present

## 2013-11-04 DIAGNOSIS — A498 Other bacterial infections of unspecified site: Secondary | ICD-10-CM | POA: Diagnosis present

## 2013-11-04 DIAGNOSIS — R531 Weakness: Secondary | ICD-10-CM

## 2013-11-04 DIAGNOSIS — G9340 Encephalopathy, unspecified: Secondary | ICD-10-CM | POA: Diagnosis present

## 2013-11-04 DIAGNOSIS — E43 Unspecified severe protein-calorie malnutrition: Secondary | ICD-10-CM | POA: Diagnosis present

## 2013-11-04 DIAGNOSIS — N390 Urinary tract infection, site not specified: Secondary | ICD-10-CM | POA: Diagnosis present

## 2013-11-04 DIAGNOSIS — G9341 Metabolic encephalopathy: Secondary | ICD-10-CM | POA: Diagnosis present

## 2013-11-04 DIAGNOSIS — Z515 Encounter for palliative care: Secondary | ICD-10-CM

## 2013-11-04 DIAGNOSIS — E871 Hypo-osmolality and hyponatremia: Secondary | ICD-10-CM | POA: Diagnosis present

## 2013-11-04 DIAGNOSIS — Z79899 Other long term (current) drug therapy: Secondary | ICD-10-CM

## 2013-11-04 LAB — URINALYSIS, ROUTINE W REFLEX MICROSCOPIC
Bilirubin Urine: NEGATIVE
Glucose, UA: NEGATIVE mg/dL
Ketones, ur: 15 mg/dL — AB
Nitrite: NEGATIVE
Protein, ur: 30 mg/dL — AB
Specific Gravity, Urine: 1.016 (ref 1.005–1.030)
Urobilinogen, UA: 0.2 mg/dL (ref 0.0–1.0)
pH: 6 (ref 5.0–8.0)

## 2013-11-04 LAB — CBC WITH DIFFERENTIAL/PLATELET
Basophils Absolute: 0 10*3/uL (ref 0.0–0.1)
Basophils Relative: 0 % (ref 0–1)
Eosinophils Absolute: 0 10*3/uL (ref 0.0–0.7)
Eosinophils Relative: 0 % (ref 0–5)
HCT: 49.8 % — ABNORMAL HIGH (ref 36.0–46.0)
Hemoglobin: 17.8 g/dL — ABNORMAL HIGH (ref 12.0–15.0)
Lymphocytes Relative: 7 % — ABNORMAL LOW (ref 12–46)
Lymphs Abs: 0.5 10*3/uL — ABNORMAL LOW (ref 0.7–4.0)
MCH: 33.1 pg (ref 26.0–34.0)
MCHC: 35.7 g/dL (ref 30.0–36.0)
MCV: 92.7 fL (ref 78.0–100.0)
Monocytes Absolute: 0.5 10*3/uL (ref 0.1–1.0)
Monocytes Relative: 8 % (ref 3–12)
Neutro Abs: 5.6 10*3/uL (ref 1.7–7.7)
Neutrophils Relative %: 85 % — ABNORMAL HIGH (ref 43–77)
Platelets: 159 10*3/uL (ref 150–400)
RBC: 5.37 MIL/uL — ABNORMAL HIGH (ref 3.87–5.11)
RDW: 12.8 % (ref 11.5–15.5)
WBC: 6.6 10*3/uL (ref 4.0–10.5)

## 2013-11-04 LAB — TROPONIN I: TROPONIN I: 1.83 ng/mL — AB (ref <0.30)

## 2013-11-04 LAB — COMPREHENSIVE METABOLIC PANEL
ALT: 32 U/L (ref 0–35)
AST: 185 U/L — ABNORMAL HIGH (ref 0–37)
Albumin: 3.8 g/dL (ref 3.5–5.2)
Alkaline Phosphatase: 83 U/L (ref 39–117)
BUN: 31 mg/dL — ABNORMAL HIGH (ref 6–23)
CO2: 27 mEq/L (ref 19–32)
Calcium: 9.3 mg/dL (ref 8.4–10.5)
Chloride: 85 mEq/L — ABNORMAL LOW (ref 96–112)
Creatinine, Ser: 0.65 mg/dL (ref 0.50–1.10)
GFR calc Af Amer: >90 mL/min (ref >90)
GFR calc non Af Amer: 80 mL/min — ABNORMAL LOW (ref >90)
Glucose, Bld: 101 mg/dL — ABNORMAL HIGH (ref 70–99)
Potassium: 3.6 mEq/L — ABNORMAL LOW (ref 3.7–5.3)
Sodium: 130 mEq/L — ABNORMAL LOW (ref 137–147)
Total Bilirubin: 0.9 mg/dL (ref 0.3–1.2)
Total Protein: 7.5 g/dL (ref 6.0–8.3)

## 2013-11-04 LAB — MAGNESIUM: Magnesium: 2 mg/dL (ref 1.5–2.5)

## 2013-11-04 LAB — URINE MICROSCOPIC-ADD ON

## 2013-11-04 LAB — CK: Total CK: 6308 U/L — ABNORMAL HIGH (ref 7–177)

## 2013-11-04 MED ORDER — DEXTROSE 5 % IV SOLN
1.0000 g | Freq: Once | INTRAVENOUS | Status: AC
  Administered 2013-11-04: 1 g via INTRAVENOUS
  Filled 2013-11-04: qty 10

## 2013-11-04 MED ORDER — POLYETHYLENE GLYCOL 3350 17 G PO PACK
17.0000 g | PACK | Freq: Every day | ORAL | Status: DC | PRN
Start: 2013-11-04 — End: 2013-11-06
  Filled 2013-11-04: qty 1

## 2013-11-04 MED ORDER — ACETAMINOPHEN 325 MG PO TABS: 650.0000 mg | ORAL_TABLET | ORAL | Status: DC | PRN

## 2013-11-04 MED ORDER — CEFTRIAXONE SODIUM 1 G IJ SOLR
1.0000 g | INTRAMUSCULAR | Status: DC
  Administered 2013-11-05: 1 g via INTRAVENOUS
  Filled 2013-11-04 (×2): qty 10

## 2013-11-04 MED ORDER — SODIUM CHLORIDE 0.9 % IV SOLN
INTRAVENOUS | Status: AC
Start: 2013-11-04 — End: 2013-11-05
  Administered 2013-11-04: 23:00:00 via INTRAVENOUS

## 2013-11-04 MED ORDER — TRIAMCINOLONE ACETONIDE 0.1 % EX CREA
1.0000 "application " | TOPICAL_CREAM | Freq: Two times a day (BID) | CUTANEOUS | Status: DC
  Administered 2013-11-04 – 2013-11-05 (×3): 1 via TOPICAL
  Filled 2013-11-04: qty 15

## 2013-11-04 MED ORDER — SODIUM CHLORIDE 0.9 % IV BOLUS (SEPSIS)
1000.0000 mL | Freq: Once | INTRAVENOUS | Status: AC
  Administered 2013-11-04: 1000 mL via INTRAVENOUS

## 2013-11-04 MED ORDER — OCUVITE PO TABS
1.0000 | ORAL_TABLET | Freq: Every day | ORAL | Status: DC
  Administered 2013-11-05 (×2): 1 via ORAL
  Filled 2013-11-04 (×3): qty 1

## 2013-11-04 MED ORDER — OMEGA-3-ACID ETHYL ESTERS 1 G PO CAPS
1.0000 g | ORAL_CAPSULE | Freq: Every day | ORAL | Status: DC
Start: 2013-11-04 — End: 2013-11-06
  Administered 2013-11-05 (×2): 1 g via ORAL
  Filled 2013-11-04 (×3): qty 1

## 2013-11-04 MED ORDER — ENOXAPARIN SODIUM 40 MG/0.4ML ~~LOC~~ SOLN
40.0000 mg | SUBCUTANEOUS | Status: DC
  Administered 2013-11-04: 40 mg via SUBCUTANEOUS
  Filled 2013-11-04: qty 0.4

## 2013-11-04 NOTE — ED Provider Notes (Signed)
CSN: [id number]     Arrival date & time 11/04/13  1753 History   First MD Initiated Contact with Patient 11/04/13 1754     Chief Complaint  Patient presents with  . Fall  . Diarrhea   (Consider location/radiation/quality/duration/timing/severity/associated sxs/prior Treatment) HPI  80 year old female brought in by EMS after being found down. She lives in an independent living facility. Last seen approximately 24 hours ago. Unknown how long she was down since then but appears to be down for close to this long just based on her exam. Found covered in feces and urine. Confused. Patient is unable to give me exact circumstances as to why she fell. She is complaining of some pain in her right face, denies any acute pain anywhere else. Patient arrived with a MOST form and DNR paperwork.   Past Medical History  Diagnosis Date  . AK (actinic keratosis)     right tip of nose and right posterior leg  . Cancer 08/07/12    squamous cell ca,keratoacanthoma-right posterior leg  . Skin cancer 06/2011    scc right elbow tx included excision  . Radiation 09/10/2012-10/24/2012    30 fractions to right posterior calf  . Thyroid disease   . Anemia   . Hypertension   . Varicose vein of leg   . Hemorrhoids   . Spastic colon   . Insomnia, unspecified   . Unspecified urinary incontinence   . Hypoxemia   . Unspecified hypothyroidism   . Abnormality of gait   . Arthritis   . Osteoarthrosis, unspecified whether generalized or localized, unspecified site   . Radiation 09/10/12-10/24/12    Squamous cell right posterior calf 6000 cGy   Past Surgical History  Procedure Laterality Date  . Abdominal hysterectomy      early 40's, abdominal  . Appendectomy    . Anterior heel repair    . Total hip arthroplasty  07/31/2010    right  . Pheochromocytoma      left  . Fracture surgery  2003    left   . Wrist fracture surgery  2003    left Dr. Newman Nip   Family History  Problem Relation Age of Onset  .  Stroke Mother   . Stroke Father    History  Substance Use Topics  . Smoking status: Former Smoker -- 0.50 packs/day for 10 years    Types: Cigarettes    Quit date: 10/30/1978  . Smokeless tobacco: Never Used  . Alcohol Use: 1.8 oz/week    3 Glasses of wine per week   OB History   Grav Para Term Preterm Abortions TAB SAB Ect Mult Living                 Review of Systems  Level V caveat because patient is encephalopathic.  Allergies  Review of patient's allergies indicates no known allergies.  Home Medications   Current Outpatient Rx  Name  Route  Sig  Dispense  Refill  . beta carotene w/minerals (OCUVITE) tablet   Oral   Take 1 tablet by mouth daily. For eyes         . emollient (BIAFINE) cream   Topical   Apply 1 application topically daily. Use after daily rad txs and prn         . Omega-3 Fatty Acids (FISH OIL) 1000 MG CAPS   Oral   Take by mouth. Take one capsule a day         . OVER THE COUNTER  MEDICATION   Topical   Apply 1 drop topically daily. i-cap gtts in each eye daily         . polyethylene glycol (MIRALAX / GLYCOLAX) packet   Oral   Take 17 g by mouth daily as needed.   14 each   3    BP 119/102 Physical Exam  Nursing note and vitals reviewed. Constitutional:  Laying in bed. Frail and chronically ill appearing.  HENT:  Ecchymosis, dependent edema and some desquamation right face/right temporal region. No bony tenderness noted. Dried blood noted on the lips, but no source of bleeding noted oropharynx or face.  Eyes: EOM are normal. Pupils are equal, round, and reactive to light. Right eye exhibits no discharge. Left eye exhibits no discharge.  Conjunctiva injected bilaterally  Cardiovascular:  No murmur heard. Tachycardic  Genitourinary:  Pressure sore of the pubic symphysis. Perineum otherwise unremarkable. Patient came in covered in feces and urine.  Musculoskeletal:  Patient appears to have been laying face down with more  pressure on her right side. She has wounds consistent with being out for a prolonged period of time over pressure points such as her pubic symphysis, right ASIS, right forearm and the right side of her face. There is some mild desquamation in the right temporal region. Chronic appearing skin changes in her lower extremities below the knee.   Neurological: She is alert.  Patient is awake and talking, but confused. Pleasant, but many answers to questions are not appropriate. She does follow some simple commands. Moving all extremities. Does not appear to have a focal neurological deficit.  Skin: She is not diaphoretic.  Distal extremities cool to touch  Psychiatric:  Pleasantly confused    ED Course  Procedures (including critical care time) Labs Review Labs Reviewed - No data to display Imaging Review No results found.  EKG Interpretation   None       MDM   1. UTI (urinary tract infection)   2. Rhabdomyolysis   3. Encephalopathy acute   4. Dehydration   5. Acute encephalopathy   6. Decubitus ulcer    80 year old female found down. Unknown period time but at the very least several hours given the wound Center exam. Feel the wounds on her pubic symphysis/pelvis related to pressure and not direct trauma. She does not appear to have any bony tenderness on exam her significant pain with range of motion of her hips. Elevated CK. IV fluids. Renal function okay. BUN is elevated consistent with dehydration. Urinalysis consistent with UTI. Rocephin. She is afebrile and hemodynamically stable. Imaging is unremarkable. Patient arrived with MOST form. Essentially stating comfort measures and specifically stating no IV. I made a decision to obtain intravenous access though. Patient was found in a pretty pitiful state. Covered in feces and urine. Pressure sores. I felt it was more humanistic and in keeping with comfort to begin IV hydration. MOST form also stating abx ok. Obtaining screening testing  but will not pursuit more aggressive/invasive care at this time until can review goals of care further with pt when she is more lucid or with loved ones.   Virgel Manifold, MD 11/05/13 1451

## 2013-11-04 NOTE — ED Notes (Signed)
Per EMS pt come from [facility] [id number], pt in independent living and didn't come down for breakfast or lunch so staff went to check on pt found her in bathroom floor covered urine and feces.  Pt has hematoma to right forehead and dried blood on lip. Pt also has abrasion toe left big toe.    Per EMS pt has MOST form which states no IVs, so they didn't start one.

## 2013-11-04 NOTE — ED Notes (Signed)
Pt returned from CT °

## 2013-11-04 NOTE — Progress Notes (Signed)
Notified by Lab critical lab value troponin 1.83 at 2345.  Page on call physicians results at 2348.

## 2013-11-04 NOTE — H&P (Signed)
[facility] History and Physical  Carol [name] URK:[id number] DOB: December 30, 1927 DOA: 11/04/2013  Referring physician: ER physician. PCP: Carol Dooms, MD   Chief Complaint: Found on the floor at her independent living facility.  Most of the history is obtained from the ER physician previous records and patient's guardian with whom I spoke over the phone.  HPI: Carol [name] is a 80 y.o. female history of hypertension and skin cancer and pheochromocytoma per the chart was brought to the ER patient was found to be unresponsive and on the floor at her independent living facility. As per the guardian Ms. Carol [name] with whom I spoke with the phone patient has been recently getting more confused. Patient was brought to the ER and was found to be minimally responsive. Patient is confused and does not follow commands. Has bruises all over the body. Has also an ulcer over the suprapubic area chronicity of which is not known. CT head and neck done in the ER is unremarkable. CK levels are found to be elevated. Labs show possible UTI. Patient has been admitted for further workup. Patient has a medical orders for scope treatment form (MOST) which primarily mentions patient is a DO NOT RESUSCITATE and only use antibiotics if patient's condition is reversible and not use any IV fluids and not to admit under critical care. I have discussed with patient's guardian who is this time is advised to go ahead with the fluids antibiotics and MRI brain and further decisions will be taken tomorrow with regarding the goals of care if patient's condition does not improve.  Review of Systems: As presented in the history of presenting illness, rest negative.  Past Medical History  Diagnosis Date  . AK (actinic keratosis)     right tip of nose and right posterior leg  . Cancer 08/07/12    squamous cell ca,keratoacanthoma-right posterior leg  . Skin cancer 06/2011    scc right elbow tx included excision  .  Radiation 09/10/2012-10/24/2012    30 fractions to right posterior calf  . Thyroid disease   . Anemia   . Hypertension   . Varicose vein of leg   . Hemorrhoids   . Spastic colon   . Insomnia, unspecified   . Unspecified urinary incontinence   . Hypoxemia   . Unspecified hypothyroidism   . Abnormality of gait   . Arthritis   . Osteoarthrosis, unspecified whether generalized or localized, unspecified site   . Radiation 09/10/12-10/24/12    Squamous cell right posterior calf 6000 cGy   Past Surgical History  Procedure Laterality Date  . Abdominal hysterectomy      early 40's, abdominal  . Appendectomy    . Anterior heel repair    . Total hip arthroplasty  07/31/2010    right  . Pheochromocytoma      left  . Fracture surgery  2003    left   . Wrist fracture surgery  2003    left Dr. Newman Nip   Social History:  reports that she quit smoking about 35 years ago. Her smoking use included Cigarettes. She has a 5 pack-year smoking history. She has never used smokeless tobacco. She reports that she drinks about 1.8 ounces of alcohol per week. She reports that she does not use illicit drugs. Where does patient live independently living facility. Can patient participate in ADLs? Not sure.  No Known Allergies  Family History:  Family History  Problem Relation Age of Onset  . Stroke Mother   .  Stroke Father       Prior to Admission medications   Medication Sig Start Date End Date Taking? Authorizing Provider  beta carotene w/minerals (OCUVITE) tablet Take 1 tablet by mouth daily. For eyes   Yes Historical Provider, MD  Omega-3 Fatty Acids (FISH OIL) 1000 MG CAPS Take by mouth. Take one capsule a day   Yes Historical Provider, MD  polyethylene glycol (MIRALAX / GLYCOLAX) packet Take 17 g by mouth daily as needed. 10/13/13  Yes Mahima Pandey, MD  triamcinolone cream (KENALOG) 0.1 % Apply 1 application topically 2 (two) times daily.   Yes Historical Provider, MD    Physical  Exam: Filed Vitals:   11/04/13 1752 11/04/13 1953 11/04/13 1959 11/04/13 2033  BP: 115/88  110/86 108/59  Pulse: 101  80 102  Temp: 97 F (36.1 C) 97.2 F (36.2 C) 97 F (36.1 C) 98.4 F (36.9 C)  TempSrc: Rectal  Oral Oral  Resp: 17  16 18   Weight:    43.727 kg (96 lb 6.4 oz)  SpO2: 99%  93% 91%     General:  Well-developed poorly nourished.  Eyes: Anicteric no pallor.  ENT: There is mild right periorbital swelling.  Neck: No mass seen.  Cardiovascular: S1-S2 heard.  Respiratory: No rhonchi or crepitations.  Abdomen: There is suprapubic ulcer measuring 10 x 2 cm in dimension with no active discharge with a granular base. Abdomen otherwise soft and nontender.  Skin: Multiple bruises. Abdominal ulcer as explained in abdomen section.  Musculoskeletal: No edema.  Psychiatric: Patient is minimally responsive.  Neurologic: Patient is minimally responsive.  Labs on Admission:  Basic Metabolic Panel:  Recent Labs Lab 11/04/13 1814  NA 130*  K 3.6*  CL 85*  CO2 27  GLUCOSE 101*  BUN 31*  CREATININE 0.65  CALCIUM 9.3  MG 2.0   Liver Function Tests:  Recent Labs Lab 11/04/13 1814  AST 185*  ALT 32  ALKPHOS 83  BILITOT 0.9  PROT 7.5  ALBUMIN 3.8   No results found for this basename: LIPASE, AMYLASE,  in the last 168 hours No results found for this basename: AMMONIA,  in the last 168 hours CBC:  Recent Labs Lab 11/04/13 1814  WBC 6.6  NEUTROABS 5.6  HGB 17.8*  HCT 49.8*  MCV 92.7  PLT 159   Cardiac Enzymes:  Recent Labs Lab 11/04/13 1814 11/04/13 2230  CKTOTAL 6308*  --   TROPONINI  --  1.83*    BNP (last 3 results) No results found for this basename: PROBNP,  in the last 8760 hours CBG: No results found for this basename: GLUCAP,  in the last 168 hours  Radiological Exams on Admission: Dg Chest 1 View  11/04/2013   CLINICAL DATA:  Fall, weakness  EXAM: CHEST - 1 VIEW  COMPARISON:  02/28/2012  FINDINGS: Hyperinflation noted without  CHF or pneumonia. No effusion or pneumothorax. Trachea midline. Normal vascularity. Atherosclerosis of the aorta. Trachea is midline.  IMPRESSION: Stable hyperinflation.  No acute process   Electronically Signed   By: Daryll Brod M.D.   On: 11/04/2013 18:46   Ct Head Wo Contrast  11/04/2013   CLINICAL DATA:  Fall  EXAM: CT HEAD WITHOUT CONTRAST  CT MAXILLOFACIAL WITHOUT CONTRAST  CT CERVICAL SPINE WITHOUT CONTRAST  TECHNIQUE: Multidetector CT imaging of the head, cervical spine, and maxillofacial structures were performed using the standard protocol without intravenous contrast. Multiplanar CT image reconstructions of the cervical spine and maxillofacial structures were also generated.  COMPARISON:  None.  FINDINGS: CT HEAD FINDINGS  No evidence of parenchymal hemorrhage or extra-axial fluid collection. No mass lesion, mass effect, or midline shift.  No CT evidence of acute infarction.  Subcortical white matter and periventricular small vessel ischemic changes. Intracranial atherosclerosis.  Mild age related atrophy.  No ventriculomegaly.  The visualized paranasal sinuses are essentially clear. The mastoid air cells are unopacified.  Mild soft tissue swelling overlying the right frontal bone (series 17/image 8).  No evidence of calvarial fracture.  CT MAXILLOFACIAL FINDINGS  Mild soft tissue swelling overlying the right frontal bone/lateral orbit.  No evidence of maxillofacial fracture.  Bilateral orbits, including the globes and retroconal soft tissues, are within normal limits.  The visualized paranasal sinuses are essentially clear. The mastoid air cells are unopacified.  CT CERVICAL SPINE FINDINGS  Reversal of the normal cervical lordosis.  No evidence of fracture or dislocation. Vertebral body heights are maintained. Dens appears intact.  No prevertebral soft tissue swelling.  Moderate degenerative changes at C5-6 and C6-7.  Visualized thyroid is unremarkable.  Visualized lung apices are notable for  biapical pleural parenchymal scarring, right greater than left.  IMPRESSION: Mild soft tissue swelling overlying the right frontal bone/orbit.  No evidence of acute intracranial abnormality. Atrophy with small vessel ischemic changes and intracranial atherosclerosis.  No evidence of maxillofacial fracture.  No evidence of traumatic injury to the cervical spine. Moderate multilevel degenerative changes.   Electronically Signed   By: Julian Hy M.D.   On: 11/04/2013 18:52   Ct Cervical Spine Wo Contrast  11/04/2013   CLINICAL DATA:  Fall  EXAM: CT HEAD WITHOUT CONTRAST  CT MAXILLOFACIAL WITHOUT CONTRAST  CT CERVICAL SPINE WITHOUT CONTRAST  TECHNIQUE: Multidetector CT imaging of the head, cervical spine, and maxillofacial structures were performed using the standard protocol without intravenous contrast. Multiplanar CT image reconstructions of the cervical spine and maxillofacial structures were also generated.  COMPARISON:  None.  FINDINGS: CT HEAD FINDINGS  No evidence of parenchymal hemorrhage or extra-axial fluid collection. No mass lesion, mass effect, or midline shift.  No CT evidence of acute infarction.  Subcortical white matter and periventricular small vessel ischemic changes. Intracranial atherosclerosis.  Mild age related atrophy.  No ventriculomegaly.  The visualized paranasal sinuses are essentially clear. The mastoid air cells are unopacified.  Mild soft tissue swelling overlying the right frontal bone (series 17/image 8).  No evidence of calvarial fracture.  CT MAXILLOFACIAL FINDINGS  Mild soft tissue swelling overlying the right frontal bone/lateral orbit.  No evidence of maxillofacial fracture.  Bilateral orbits, including the globes and retroconal soft tissues, are within normal limits.  The visualized paranasal sinuses are essentially clear. The mastoid air cells are unopacified.  CT CERVICAL SPINE FINDINGS  Reversal of the normal cervical lordosis.  No evidence of fracture or dislocation.  Vertebral body heights are maintained. Dens appears intact.  No prevertebral soft tissue swelling.  Moderate degenerative changes at C5-6 and C6-7.  Visualized thyroid is unremarkable.  Visualized lung apices are notable for biapical pleural parenchymal scarring, right greater than left.  IMPRESSION: Mild soft tissue swelling overlying the right frontal bone/orbit.  No evidence of acute intracranial abnormality. Atrophy with small vessel ischemic changes and intracranial atherosclerosis.  No evidence of maxillofacial fracture.  No evidence of traumatic injury to the cervical spine. Moderate multilevel degenerative changes.   Electronically Signed   By: Julian Hy M.D.   On: 11/04/2013 18:52   Ct Maxillofacial Wo Cm  11/04/2013   CLINICAL  DATA:  Fall  EXAM: CT HEAD WITHOUT CONTRAST  CT MAXILLOFACIAL WITHOUT CONTRAST  CT CERVICAL SPINE WITHOUT CONTRAST  TECHNIQUE: Multidetector CT imaging of the head, cervical spine, and maxillofacial structures were performed using the standard protocol without intravenous contrast. Multiplanar CT image reconstructions of the cervical spine and maxillofacial structures were also generated.  COMPARISON:  None.  FINDINGS: CT HEAD FINDINGS  No evidence of parenchymal hemorrhage or extra-axial fluid collection. No mass lesion, mass effect, or midline shift.  No CT evidence of acute infarction.  Subcortical white matter and periventricular small vessel ischemic changes. Intracranial atherosclerosis.  Mild age related atrophy.  No ventriculomegaly.  The visualized paranasal sinuses are essentially clear. The mastoid air cells are unopacified.  Mild soft tissue swelling overlying the right frontal bone (series 17/image 8).  No evidence of calvarial fracture.  CT MAXILLOFACIAL FINDINGS  Mild soft tissue swelling overlying the right frontal bone/lateral orbit.  No evidence of maxillofacial fracture.  Bilateral orbits, including the globes and retroconal soft tissues, are within  normal limits.  The visualized paranasal sinuses are essentially clear. The mastoid air cells are unopacified.  CT CERVICAL SPINE FINDINGS  Reversal of the normal cervical lordosis.  No evidence of fracture or dislocation. Vertebral body heights are maintained. Dens appears intact.  No prevertebral soft tissue swelling.  Moderate degenerative changes at C5-6 and C6-7.  Visualized thyroid is unremarkable.  Visualized lung apices are notable for biapical pleural parenchymal scarring, right greater than left.  IMPRESSION: Mild soft tissue swelling overlying the right frontal bone/orbit.  No evidence of acute intracranial abnormality. Atrophy with small vessel ischemic changes and intracranial atherosclerosis.  No evidence of maxillofacial fracture.  No evidence of traumatic injury to the cervical spine. Moderate multilevel degenerative changes.   Electronically Signed   By: Julian Hy M.D.   On: 11/04/2013 18:52     Assessment/Plan Principal Problem:   Acute encephalopathy Active Problems:   UTI (urinary tract infection)   Decubitus ulcer   Rhabdomyolysis   Dehydration   1. Acute encephalopathy - differentials include possible seizures, CVA, possibly secondary to UTI and metabolic causes including dehydration. At this time as discussed with patient's guardian time continue with IV fluids and antibiotics further rhabdomyolysis and UTI. We will check MRI brain in a.m. for any stroke. Patient has been placed on neurochecks until then with swallow evaluation. Will check EEG for possible seizures. Patient does not have any obvious convulsions at this time and response to her name when called. 2. Rhabdomyolysis - possibly secondary to the fall or seizures. Continued IV rehydration and follow CK levels. Check troponin and EKG. 3. UTI - patient has been placed on IV ceftriaxone. Check urine cultures. 4. Suprapubic skin ulcer - patient has had his history of skin cancer. I am not sure of the chronicity  of skin ulcer. I have requested wound team consult. 5. Mild hyponatremia probably from dehydration - gently hydrate and closely follow metabolic panel. 6. History of hypertension - presently on no medications. Closely follow blood pressure trends. 7. Mildly elevated AST - follow LFTs.  I have reviewed patient's old charts and labs.  I have discussed with patient's state guardian Ms. Carol [name] who could be reached at [phone]. At this time is Carol [name] has advised to continue with the above-mentioned interventions. Patient is a DO NOT RESUSCITATE. Tomorrow they will have further discussion with regarding the goals of care. I have also consulted palliative care team.  Code Status: DO NOT RESUSCITATE.  Family  Communication: Discuss with patient's state guardian as mentioned above.  Disposition Plan: Admit to inpatient.    Tredarius Cobern N. [facility] Pager [phone].  If 7PM-7AM, please contact night-coverage [email or web address] Password [facility] 11/04/2013, 11:56 PM

## 2013-11-04 NOTE — ED Notes (Signed)
Dalphine Handing was called as this is the only emergency contact information in chart. This is an office for corporation of guardianship, Mr Derrek Monaco is not listed in the contacts, general voicemail left with [facility] number and charge nurse number, instructed to call back in order to obtain some information on Mrs. [employer]

## 2013-11-04 NOTE — ED Notes (Signed)
bilat bruising to lower arms and bruising to right hand

## 2013-11-04 NOTE — Progress Notes (Signed)
Ceftriaxone Consult  Pharmacy received consult to dose ceftriaxone for UTI. Pt is afebrile, WBC wnl, urine culture pending. No previous urine culture seen via EPIC review. Pt received ceftriaxone 1g IV x 1 in the ED this evening.   Plan: Ceftriaxone 1g IV q24h, next dose 1/29 @ 2000. Pharmacy will sign off.   Vanessa Crown, PharmD, BCPS Pager: [phone] 9:43 PM Pharmacy #: [phone]

## 2013-11-05 ENCOUNTER — Inpatient Hospital Stay (HOSPITAL_COMMUNITY)
Admit: 2013-11-05 | Discharge: 2013-11-05 | Disposition: A | Discharge status: Home / Self Care | Payer: Medicare Other | Attending: Internal Medicine | Admitting: Internal Medicine

## 2013-11-05 ENCOUNTER — Inpatient Hospital Stay (HOSPITAL_COMMUNITY): Payer: Medicare Other

## 2013-11-05 DIAGNOSIS — E43 Unspecified severe protein-calorie malnutrition: Secondary | ICD-10-CM | POA: Diagnosis present

## 2013-11-05 DIAGNOSIS — I214 Non-ST elevation (NSTEMI) myocardial infarction: Secondary | ICD-10-CM | POA: Diagnosis present

## 2013-11-05 DIAGNOSIS — R531 Weakness: Secondary | ICD-10-CM

## 2013-11-05 DIAGNOSIS — R5381 Other malaise: Secondary | ICD-10-CM

## 2013-11-05 DIAGNOSIS — R5383 Other fatigue: Secondary | ICD-10-CM

## 2013-11-05 DIAGNOSIS — Z515 Encounter for palliative care: Secondary | ICD-10-CM

## 2013-11-05 DIAGNOSIS — T1490XA Injury, unspecified, initial encounter: Secondary | ICD-10-CM | POA: Diagnosis present

## 2013-11-05 LAB — COMPREHENSIVE METABOLIC PANEL
ALT: 29 U/L (ref 0–35)
AST: 168 U/L — AB (ref 0–37)
Albumin: 3.1 g/dL — ABNORMAL LOW (ref 3.5–5.2)
Alkaline Phosphatase: 63 U/L (ref 39–117)
BILIRUBIN TOTAL: 0.4 mg/dL (ref 0.3–1.2)
BUN: 45 mg/dL — ABNORMAL HIGH (ref 6–23)
CHLORIDE: 91 meq/L — AB (ref 96–112)
CO2: 26 mEq/L (ref 19–32)
Calcium: 8.6 mg/dL (ref 8.4–10.5)
Creatinine, Ser: 1.04 mg/dL (ref 0.50–1.10)
GFR calc Af Amer: 56 mL/min — ABNORMAL LOW (ref >90)
GFR calc non Af Amer: 48 mL/min — ABNORMAL LOW (ref >90)
Glucose, Bld: 93 mg/dL (ref 70–99)
POTASSIUM: 3.5 meq/L — AB (ref 3.7–5.3)
SODIUM: 130 meq/L — AB (ref 137–147)
Total Protein: 6.1 g/dL (ref 6.0–8.3)

## 2013-11-05 LAB — CBC WITH DIFFERENTIAL/PLATELET
BASOS ABS: 0 10*3/uL (ref 0.0–0.1)
Basophils Relative: 0 % (ref 0–1)
EOS PCT: 0 % (ref 0–5)
Eosinophils Absolute: 0 10*3/uL (ref 0.0–0.7)
HEMATOCRIT: 42.7 % (ref 36.0–46.0)
Hemoglobin: 15 g/dL (ref 12.0–15.0)
LYMPHS PCT: 10 % — AB (ref 12–46)
Lymphs Abs: 0.7 10*3/uL (ref 0.7–4.0)
MCH: 32.8 pg (ref 26.0–34.0)
MCHC: 35.1 g/dL (ref 30.0–36.0)
MCV: 93.4 fL (ref 78.0–100.0)
MONO ABS: 0.7 10*3/uL (ref 0.1–1.0)
Monocytes Relative: 10 % (ref 3–12)
Neutro Abs: 5.3 10*3/uL (ref 1.7–7.7)
Neutrophils Relative %: 80 % — ABNORMAL HIGH (ref 43–77)
Platelets: 164 10*3/uL (ref 150–400)
RBC: 4.57 MIL/uL (ref 3.87–5.11)
RDW: 12.8 % (ref 11.5–15.5)
WBC: 6.6 10*3/uL (ref 4.0–10.5)

## 2013-11-05 LAB — APTT: aPTT: 35 seconds (ref 24–37)

## 2013-11-05 LAB — RAPID URINE DRUG SCREEN, HOSP PERFORMED
AMPHETAMINES: NOT DETECTED
BARBITURATES: NOT DETECTED
Benzodiazepines: NOT DETECTED
Cocaine: NOT DETECTED
Opiates: NOT DETECTED
Tetrahydrocannabinol: NOT DETECTED

## 2013-11-05 LAB — TROPONIN I
TROPONIN I: 1.46 ng/mL — AB (ref <0.30)
Troponin I: 1.55 ng/mL (ref <0.30)
Troponin I: 1.6 ng/mL (ref <0.30)

## 2013-11-05 LAB — GLUCOSE, CAPILLARY
GLUCOSE-CAPILLARY: 88 mg/dL (ref 70–99)
GLUCOSE-CAPILLARY: 80 mg/dL (ref 70–99)
Glucose-Capillary: 92 mg/dL (ref 70–99)

## 2013-11-05 LAB — PROTIME-INR
INR: 0.97 (ref 0.00–1.49)
PROTHROMBIN TIME: 12.7 s (ref 11.6–15.2)

## 2013-11-05 LAB — CK: Total CK: 4842 U/L — ABNORMAL HIGH (ref 7–177)

## 2013-11-05 MED ORDER — ENSURE COMPLETE PO LIQD
237.0000 mL | Freq: Two times a day (BID) | ORAL | Status: DC
  Administered 2013-11-05: 237 mL via ORAL

## 2013-11-05 MED ORDER — SODIUM CHLORIDE 0.9 % IV BOLUS (SEPSIS)
250.0000 mL | Freq: Once | INTRAVENOUS | Status: AC
Start: 2013-11-05 — End: 2013-11-05
  Administered 2013-11-05: 250 mL via INTRAVENOUS

## 2013-11-05 MED ORDER — HEPARIN (PORCINE) IN NACL 100-0.45 UNIT/ML-% IJ SOLN
500.0000 [IU]/h | INTRAMUSCULAR | Status: DC
  Administered 2013-11-05: 500 [IU]/h via INTRAVENOUS
  Filled 2013-11-05: qty 250

## 2013-11-05 MED ORDER — ASPIRIN 300 MG RE SUPP
300.0000 mg | Freq: Once | RECTAL | Status: AC
  Administered 2013-11-05: 300 mg via RECTAL
  Filled 2013-11-05: qty 1

## 2013-11-05 MED ORDER — LORAZEPAM 2 MG/ML IJ SOLN
0.5000 mg | Freq: Once | INTRAMUSCULAR | Status: AC
  Administered 2013-11-05: 0.5 mg via INTRAVENOUS
  Filled 2013-11-05: qty 1

## 2013-11-05 NOTE — Consult Note (Signed)
WOC wound consult note Reason for Consult: Patient is s/p fall in her home which is an apartment located within the confines of an adult care community.  She is an [facility] resident.  Was down for an unknown period of time, but less than 24 hours; it appears she was mostly on her right side as right side of head, right arm, right LE, right iliac crest are all bruised (and bruising patter is evolving) and there is tissue ischemia noted at the right iliac crest.  There is a significant affected area at the symphysis pubis that is already becoming eschar at the surface.  Wound type:tissue ischemia (pressure wounds) Carol [name] Pressure Ulcer POA: Yes Measurement: Pubis;  10 x 6cm area of ecchymosis with a 3 x 7.5cm eschar.    Right iliac crest:  1.5cm x 2cm in a 10cm x 3cm area of ecchymosis. Wound bed: Both with soft newly forming eschar. No depth. Drainage (amount, consistency, odor) No drainage, no odor Periwound: periwound is with bruising (ecchymosis), not erythema and no induration  Dressing procedure/placement/frequency: I will employ conservative measures to the right  iliac crest and RLE wounds (soft silicone foam) and believe we should leave the pubis wound open to air and observe frequently for changes at this time.  I recommend consideration of a urology consultation as this level of trauma at the pubis may have caused damage internally to the bladder.  Additionally, if the wound progresses (i.e., if the eschar does not simply harden, dry and eventually lift off, but rather becomes soft, acquires an odor or presents as unstable), consider CCS or Plastics consultation for debridement. Thank you for inviting me to consult on this nice woman.  [place] nursing team will not follow, but will remain available to this patient, the nursing and medical teams.  Please re-consult if needed. Thanks, Maudie Flakes, MSN, RN, [place], [place], [place] [phone])

## 2013-11-05 NOTE — Progress Notes (Signed)
INITIAL NUTRITION ASSESSMENT  Pt meets criteria for SEVERE MALNUTRITION in the context of chronic illness as evidenced by energy intake of </= 75% for >/= 1 month and severe fat and muscle mass depletion.  DOCUMENTATION CODES Per approved criteria  -Severe malnutrition in the context of chronic illness -Underweight   INTERVENTION: 1. Once diet advances, provide Ensure PO BID between meals 2. Continue to monitor pt. 3. Encourage general healthy diet to promote PO intake  NUTRITION DIAGNOSIS: Inadequate oral intake related to restrictive eating habits as evidenced by diet recall <75% estimated needs.   Goal: Pt to meet >/= 90% of their estimated nutrition needs.  Monitor:  PO intake, weight trends, labs, oral supplement acceptance, general healthy diet education needs  Reason for Assessment: Pt was identified as nutrition risk by the malnutrition screening tool.  80 y.o. female  Admitting Dx: Acute encephalopathy  ASSESSMENT: Carol [name] is a 80 y.o. female history of hypertension and skin cancer and pheochromocytoma per the chart. Pt was found to be unresponsive and on the floor at her independent living facility. Pt has hematoma to right forehead and dried blood on lip. Pt also has abrasion toe left big toe and bilat bruising to lower arms and bruising to right hand. Pt with admitted dx of acute encephalopathy and has UTI, decubitus ulcer, rhabdomyolysis, and dehydration.  -Pt denied any changes in appetite, but did report that her baseline appetite is minimal. Pt reports her intake as being small as she does not want to get really full. Pt denies any nausea or vomiting. She reports having 3 small meals a day that each are usually around 200 kcal. Was able to obtain minimal diet recall of cereal consumption at breakfast, and large consumption of fruits and vegetables -She is very fat restrictive in her diets and reports not eating beef and avoids fatty foods and other fat containing  foods (cheese, some yogurts). RD suspects inadequate protein and energy intake. Pt prepares about 2 of her own meals and then consumes 1 meal provided by the living facility. Pt reports usual body weight of 100 lbs. Pt does not like nutritional shakes, but is willing to try Ensure or Boost as she reports wanting to change and try something new. Pt denies any problems chewing and swallowing -Pt was emotional during RD visit as she reports not remembering her fall and is fearful. Pt gets around with a walker at the living facility.   Nutrition Focused Physical Exam:  Subcutaneous Fat:  Orbital Region: WNL Upper Arm Region: Severe depletion Thoracic and Lumbar Region: Severe depletion  Muscle:  Temple Region: Moderate depletion Clavicle Bone Region: Severe depletion Clavicle and Acromion Bone Region: Severe depletion Scapular Bone Region: N/A Dorsal Hand: Severe depletion Patellar Region: Moderate depletion Anterior Thigh Region: Moderate depletion Posterior Calf Region: Severe depletion  Edema: none   Height: Ht Readings from Last 1 Encounters:  11/05/13 5' 3.39" (1.61 m)    Weight: Wt Readings from Last 1 Encounters:  11/04/13 96 lb 6.4 oz (43.727 kg)    Ideal Body Weight: 115 lbs  % Ideal Body Weight: 83%  Wt Readings from Last 10 Encounters:  11/04/13 96 lb 6.4 oz (43.727 kg)  08/04/13 104 lb (47.174 kg)  06/30/13 101 lb (45.813 kg)  06/02/13 100 lb (45.36 kg)  05/12/13 101 lb 9.6 oz (46.085 kg)  04/24/13 101 lb (45.813 kg)  04/21/13 101 lb (45.813 kg)  02/10/13 103 lb 3.2 oz (46.811 kg)  12/30/12 102 lb (46.267 kg)  12/09/12 104 lb 3.2 oz (47.265 kg)    Usual Body Weight: 100 lbs  % Usual Body Weight: 96%  BMI:  Body mass index is 16.87 kg/(m^2). Underweight  Estimated Nutritional Needs: Kcal: 1400-1600 kcals Protein: 60-70 grams Fluid: >1.5L/day  Skin: Multiple bruises, skin tear on right side of face, and abdominal(unstageable) and hip(stage 2)  pressure ulcers   Diet Order: NPO  EDUCATION NEEDS: -Education not appropriate at this time  No intake or output data in the 24 hours ending 11/05/13 0901  Last BM: 1/29   Labs:   Recent Labs Lab 11/04/13 1814 11/05/13 0400  NA 130* 130*  K 3.6* 3.5*  CL 85* 91*  CO2 27 26  BUN 31* 45*  CREATININE 0.65 1.04  CALCIUM 9.3 8.6  MG 2.0  --   GLUCOSE 101* 93    CBG (last 3)   Recent Labs  11/04/13 2350 11/05/13 0400 11/05/13 0749  GLUCAP 92 88 80    Scheduled Meds: . beta carotene w/minerals  1 tablet Oral Daily  . cefTRIAXone (ROCEPHIN)  IV  1 g Intravenous Q24H  . omega-3 acid ethyl esters  1 g Oral Daily  . triamcinolone cream  1 application Topical BID    Continuous Infusions: . sodium chloride 125 mL/hr at 11/04/13 2259  . heparin 500 Units/hr (11/05/13 0717)    Past Medical History  Diagnosis Date  . AK (actinic keratosis)     right tip of nose and right posterior leg  . Cancer 08/07/12    squamous cell ca,keratoacanthoma-right posterior leg  . Skin cancer 06/2011    scc right elbow tx included excision  . Radiation 09/10/2012-10/24/2012    30 fractions to right posterior calf  . Thyroid disease   . Anemia   . Hypertension   . Varicose vein of leg   . Hemorrhoids   . Spastic colon   . Insomnia, unspecified   . Unspecified urinary incontinence   . Hypoxemia   . Unspecified hypothyroidism   . Abnormality of gait   . Arthritis   . Osteoarthrosis, unspecified whether generalized or localized, unspecified site   . Radiation 09/10/12-10/24/12    Squamous cell right posterior calf 6000 cGy    Past Surgical History  Procedure Laterality Date  . Abdominal hysterectomy      early 40's, abdominal  . Appendectomy    . Anterior heel repair    . Total hip arthroplasty  07/31/2010    right  . Pheochromocytoma      left  . Fracture surgery  2003    left   . Wrist fracture surgery  2003    left Dr. Lysle Rubens Dietetic  Intern Pager: [phone]   [place] Clinical Dietitian [phone]

## 2013-11-05 NOTE — Progress Notes (Signed)
Chaplain made a cold call visit to pt. Pt was very receptive to have visit from chaplain. Chaplain provided prayer, encouragement and a listening ear for pt. Chaplain sat with pt and provided company, because pt stated she does not get many visits   Charyl Dancer, Chaplain

## 2013-11-05 NOTE — Consult Note (Signed)
Patient Carol [name]      DOB: 11-Oct-1927      EPP:[id number]     Consult Note from the Palliative Medicine Team at [place] Requested by: Carol [name]     PCP: Carol Dooms, MD Reason for Consultation:Clarification of Carol [name] and options     Phone [phone]  Assessment of patients Current state:   Spoke with Carol Barman RN at [facility] # [phone] who knows this patient well and has cared for her over the past four years.  I also spoke with Carol [name] documented Carol [name] # [phone]. (only living family is a sister in [place] with dementia) Completed  MOST form reviewed.  All support that the focus of care for this patient is comfort as documented on her MOST form.  Caregivers report continued physical, functional and cognitive decline over the past six months.   Plan is to get her back home in the SNF area of [place] and to focus on comfort for this patient.  Probably dc in morning when medically stable.   Goals of Care: 1.  Code Status:  DNR/DNI-comfort is main focsu of care   2. Scope of Treatment: 1. Vital Signs: per unit 2. Respiratory/Oxygen: for comfort only 3. Nutritional Support/Tube Feeds:no artifical feding now or in the future 4. Antibiotics:yes for now, and in future make decision when infection occurs 5. Blood Products:none 6. IVF:yes  7. Labs: none 8. Telemetry: none 9. Consults:none   4. Disposition:   Back home to [facility], will need SNF level of care, focusing on comfort.  When appropriate, transition to hospice.   3. Symptom Management:   1.  Weakness/Failure to thrive                          Medical management of UTI                                 Comfort feeds as toelreated                                 Comfort is main focus of care    Brief FTD:DUKGU Loth is a 80 y.o. female history of hypertension and skin cancer and pheochromocytoma per the chart was brought to the ER patient was found to  be unresponsive and on the floor at her independent living facility. As per the guardian Ms. Carol [name] with whom I spoke with the phone patient has been recently getting more confused. Patient was brought to the ER and was found to be minimally responsive. Patient is confused and does not follow commands. Has bruises all over the body. Has also an ulcer over the suprapubic area chronicity of which is not known. CT head and neck done in the ER is unremarkable. CK levels are found to be elevated. Labs show possible UTI. Patient has been admitted for further workup.  Patient has a medical orders for scope treatment form (MOST) which primarily mentions patient is a DO NOT RESUSCITATE and only use antibiotics if patient's condition is reversible and not use any IV fluids and not to admit under critical care. I have discussed with patient's guardian who is this time is advised to go ahead with the fluids antibiotics and MRI brain and further decisions will be taken  tomorrow with regarding the goals of care if patient's condition does not improve.     ROS: unable to illicit due to altered cognition     PMH:  Past Medical History  Diagnosis Date  . AK (actinic keratosis)     right tip of nose and right posterior leg  . Cancer 08/07/12    squamous cell ca,keratoacanthoma-right posterior leg  . Skin cancer 06/2011    scc right elbow tx included excision  . Radiation 09/10/2012-10/24/2012    30 fractions to right posterior calf  . Thyroid disease   . Anemia   . Hypertension   . Varicose vein of leg   . Hemorrhoids   . Spastic colon   . Insomnia, unspecified   . Unspecified urinary incontinence   . Hypoxemia   . Unspecified hypothyroidism   . Abnormality of gait   . Arthritis   . Osteoarthrosis, unspecified whether generalized or localized, unspecified site   . Radiation 09/10/12-10/24/12    Squamous cell right posterior calf 6000 cGy     PSH: Past Surgical History  Procedure  Laterality Date  . Abdominal hysterectomy      early 40's, abdominal  . Appendectomy    . Anterior heel repair    . Total hip arthroplasty  07/31/2010    right  . Pheochromocytoma      left  . Fracture surgery  2003    left   . Wrist fracture surgery  2003    left Carol. Newman [name]   I have reviewed the [facility] and  If appropriate update it with new information. No Known Allergies Scheduled Meds: . beta carotene w/minerals  1 tablet Oral Daily  . cefTRIAXone (ROCEPHIN)  IV  1 g Intravenous Q24H  . omega-3 acid ethyl esters  1 g Oral Daily  . triamcinolone cream  1 application Topical BID   Continuous Infusions: . sodium chloride 125 mL/hr at 11/04/13 2259  . heparin 500 Units/hr (11/05/13 0717)   PRN Meds:.acetaminophen, polyethylene glycol    BP 139/78  Pulse 102  Temp(Src) 97.8 F (36.6 C) (Oral)  Resp 16  Ht 5' 3.39" (1.61 m)  Wt 43.727 kg (96 lb 6.4 oz)  BMI 16.87 kg/m2  SpO2 98%   PPS: 30 % at best  No intake or output data in the 24 hours ending 11/05/13 1126 LBM: PTA                      Physical Exam:  General:  Chronically ill appearing, NAD HEENT: noted facial bruising Chest:   CTA CVS:  tachycardic Abdomen:soft NT +BS Ext: without edema Skin: noted multiple bruises and wounds, see Carol [name] CWOCN note  Neuro: awake, oriented to person only, confused to place and situatiobn  Labs: CBC    Component Value Date/Time   WBC 6.6 11/05/2013 0400   RBC 4.57 11/05/2013 0400   HGB 15.0 11/05/2013 0400   HCT 42.7 11/05/2013 0400   PLT 164 11/05/2013 0400   MCV 93.4 11/05/2013 0400   MCH 32.8 11/05/2013 0400   MCHC 35.1 11/05/2013 0400   RDW 12.8 11/05/2013 0400   LYMPHSABS 0.7 11/05/2013 0400   MONOABS 0.7 11/05/2013 0400   EOSABS 0.0 11/05/2013 0400   BASOSABS 0.0 11/05/2013 0400    BMET    Component Value Date/Time   NA 130* 11/05/2013 0400   K 3.5* 11/05/2013 0400   CL 91* 11/05/2013 0400   CO2 26 11/05/2013 0400  GLUCOSE 93 11/05/2013 0400    BUN 45* 11/05/2013 0400   CREATININE 1.04 11/05/2013 0400   CALCIUM 8.6 11/05/2013 0400   GFRNONAA 48* 11/05/2013 0400   GFRAA 56* 11/05/2013 0400    CMP     Component Value Date/Time   NA 130* 11/05/2013 0400   K 3.5* 11/05/2013 0400   CL 91* 11/05/2013 0400   CO2 26 11/05/2013 0400   GLUCOSE 93 11/05/2013 0400   BUN 45* 11/05/2013 0400   CREATININE 1.04 11/05/2013 0400   CALCIUM 8.6 11/05/2013 0400   PROT 6.1 11/05/2013 0400   ALBUMIN 3.1* 11/05/2013 0400   AST 168* 11/05/2013 0400   ALT 29 11/05/2013 0400   ALKPHOS 63 11/05/2013 0400   BILITOT 0.4 11/05/2013 0400   GFRNONAA 48* 11/05/2013 0400   GFRAA 56* 11/05/2013 0400    Time In Time Out Total Time Spent with Patient Total Overall Time  0800 0915 70 min 75 min    Greater than 50%  of this time was spent counseling and coordinating care related to the above assessment and plan.  Wadie Lessen NP  Palliative Medicine Team Team Phone # [phone] Pager [phone]  Discussed with Carol Rockne Menghini

## 2013-11-05 NOTE — Progress Notes (Signed)
Dr. Hal Hope of [facility] admitted this lady tonight after she was round unresponsive on the floor at her ALF. She has an extensive PMHx. She was minimally responsive upon arrival. Labs showed UTI, hyponatremia, and rhabdo. CT head was neg and MRI brain is planned. See orders for treatment plan.  Patient's first troponin came back elevated at 1.38. A stat EKG was done and reviewed by Dr. Hal Hope. We will go ahead and start pt on Heparin IV for possible acute coronary syndrome/NSTEMI, management per pharmacy.   Pt's guardian endorsed DNR on admission and is considering no further care depending on how pt does over the next 24 hrs or so. The pt also has a MOST form with her from the ALF. We can set up a goals of care meeting with pt's guardian tomorrow.   Will continue to cycle cardiac enzymes. Pt has not c/o chest pain.  All above discussed and approved by Dr. Hal Hope.   Clance Boll, NP [facility]

## 2013-11-05 NOTE — Progress Notes (Signed)
Clinical Social Work Department CLINICAL SOCIAL WORK PLACEMENT NOTE 11/05/2013  Patient:  Carol [name], Carol [name]  Account Number:  [id number] [place] date:  11/04/2013  Clinical Social Worker:  Ulyess Blossom  Date/time:  11/05/2013 04:02 PM  Clinical Social Work is seeking post-discharge placement for this patient at the following level of care:   SKILLED NURSING   (*CSW will update this form in Epic as items are completed)   11/05/2013  Patient/family provided with [place] Department of Clinical Social Work's list of facilities offering this level of care within the geographic area requested by the patient (or if unable, by the patient's family).  11/05/2013  Patient/family informed of their freedom to choose among providers that offer the needed level of care, that participate in Medicare, Medicaid or managed care program needed by the patient, have an available bed and are willing to accept the patient.  11/05/2013  Patient/family informed of [facility]' ownership interest in [facility], as well as of the fact that they are under no obligation to receive care at this facility.  PASARR submitted to EDS on 11/05/2013 PASARR number received from EDS on 11/05/2013- existing pasarr  FL2 transmitted to all facilities in geographic area requested by pt/family on  11/05/2013 FL2 transmitted to all facilities within larger geographic area on   Patient informed that his/her managed care company has contracts with or will negotiate with  certain facilities, including the following:     Patient/family informed of bed offers received:   Patient chooses bed at  Physician recommends and patient chooses bed at    Patient to be transferred to  on   Patient to be transferred to facility by   The following physician request were entered in Epic:   Additional Comments:    Alison Murray, MSW, [place] Work [phone]

## 2013-11-05 NOTE — Care Management Note (Signed)
   CARE MANAGEMENT NOTE 11/05/2013  Patient:  RITI, ROLLYSON   Account Number:  [id number]  Date Initiated:  11/05/2013  Documentation initiated by:  Yameli Delamater  Subjective/Objective Assessment:   80 yo female admitted with UTI.     Action/Plan:   SNF   Anticipated DC Date:     Anticipated DC Plan:  SKILLED NURSING FACILITY  In-house referral  Clinical Social Worker      DC Forensic scientist  CM consult      Choice offered to / List presented to:  NA   DME arranged  NA      DME agency  NA     [place] arranged  NA      [place] agency  NA   Status of service:  Completed, signed off Medicare Important Message given?   (If response is "NO", the following Medicare IM given date fields will be blank) Date Medicare IM given:   Date Additional Medicare IM given:    Discharge Disposition:    Per UR Regulation:  Reviewed for med. necessity/level of care/duration of stay  If discussed at [place] of Stay Meetings, dates discussed:    Comments:  11/05/13 [place] [phone] Chart reviewed for utilization of services. Palliative care cnsulted to establish GOC. pt disposition to SNF. CSW following. Cm to sign off.

## 2013-11-05 NOTE — Progress Notes (Signed)
EEG completed offsite at [facility].

## 2013-11-05 NOTE — Progress Notes (Signed)
ANTICOAGULATION CONSULT NOTE - Initial Consult  Pharmacy Consult for IV heparin Indication: chest pain/ACS  No Known Allergies  Patient Measurements: Height: 5' 3.39" (161 cm) Weight: 96 lb 6.4 oz (43.727 kg) IBW/kg (Calculated) : 53.29   Vital Signs: Temp: 98.4 F (36.9 C) (01/28 2033) Temp src: Oral (01/28 2033) BP: 108/59 mmHg (01/28 2033) Pulse Rate: 102 (01/28 2033)  Labs:  Recent Labs  11/04/13 0030 11/04/13 1814 11/04/13 2230  HGB  --  17.8*  --   HCT  --  49.8*  --   PLT  --  159  --   CREATININE  --  0.65  --   CKTOTAL  --  5277*  --   TROPONINI 1.55*  --  1.83*    Estimated Creatinine Clearance: 36.8 ml/min (by C-G formula based on Cr of 0.65).   Medical History: Past Medical History  Diagnosis Date  . AK (actinic keratosis)     right tip of nose and right posterior leg  . Cancer 08/07/12    squamous cell ca,keratoacanthoma-right posterior leg  . Skin cancer 06/2011    scc right elbow tx included excision  . Radiation 09/10/2012-10/24/2012    30 fractions to right posterior calf  . Thyroid disease   . Anemia   . Hypertension   . Varicose vein of leg   . Hemorrhoids   . Spastic colon   . Insomnia, unspecified   . Unspecified urinary incontinence   . Hypoxemia   . Unspecified hypothyroidism   . Abnormality of gait   . Arthritis   . Osteoarthrosis, unspecified whether generalized or localized, unspecified site   . Radiation 09/10/12-10/24/12    Squamous cell right posterior calf 6000 cGy    Medications:  Scheduled:  . beta carotene w/minerals  1 tablet Oral Daily  . cefTRIAXone (ROCEPHIN)  IV  1 g Intravenous Q24H  . omega-3 acid ethyl esters  1 g Oral Daily  . triamcinolone cream  1 application Topical BID   Infusions:  . sodium chloride 125 mL/hr at 11/04/13 2259    Assessment: 80 yo admitted after being found unresponsive at independent living facility now with elevated troponin. Pt was given Lovenox 40mg  @2259 - which is basically  full dose for this pt with wt=43 kg.    Goal of Therapy:  Heparin level 0.3-0.7 units/ml Monitor platelets by anticoagulation protocol: Yes   Plan:   Heparin 500 units/hr (3ml/hr) to start @ 0600 which is ~ 75% of dosing interval  Daily CBC/HL  Check 1st HL 8 hours after drip started  Dorrene German 11/05/2013,2:33 AM

## 2013-11-05 NOTE — Progress Notes (Addendum)
Lab event: Please note that in the lab results portion of the chart the troponins are put in at the wrong time. The pt's first troponin was 1.83 at 11/04/13 @ 2230 hrs. The 2nd troponin is 1.55 and is mistakenly labeled 11/04/13 @ 0030. So, according to the times, it appears the troponin is rising when actually it is trending downward. I have asked the lab to run another troponin at this time so it will be logged in on 11/05/13 and should reflect the downward trend.  Clance Boll, NP [facility] Update: Recheck troponin 1.46 and is now in computer results under correct date and time.  KJKG, NP

## 2013-11-05 NOTE — Progress Notes (Signed)
Clinical Social Work Department BRIEF PSYCHOSOCIAL ASSESSMENT 11/05/2013  Patient:  Carol [name], Carol [name]     Account Number:  [id number]     Admit date:  11/04/2013  Clinical Social Worker:  Ulyess Blossom  Date/Time:  11/05/2013 11:37 AM  Referred by:  Physician  Date Referred:  11/05/2013 Referred for  Other - See comment   Other Referral:   Pt admitted from [place] type:  Patient Other interview type:   left voice message with pt guardian Ms. Floria Raveling who could be reached at [phone]    PSYCHOSOCIAL DATA Living Status:  FACILITY Admitted from facility:  [facility] Level of care:  Independent Living Primary support name:  Dorian Fredrickson/[phone] Primary support relationship to patient:  NONE Degree of support available:   adequate    CURRENT CONCERNS Current Concerns  Post-Acute Placement   Other Concerns:    SOCIAL WORK ASSESSMENT / PLAN CSW received referral that pt admitted from [place].    Per chart, pt was found unresponsive and on the floor at her independent living facility. Admitting MD spoke with guardian who reported that pt has been getting increasingly confused.    CSW met with pt at bedside. Pt was oriented to self, but displayed confusion otherwise. Pt did state that she [facility] but could not provide detailed accounts of currently residing at facility.    CSW contacted pt guardian, Floria Raveling via telephone and left voice message.    Per chart, pt guardian plans to meet with palliative medicine team in order to establish goals of care.    CSW requested MD place PT/OT evaluations as well as it is likely that pt will need higher level of care.    CSW left message with [place] [facility] admissions coordinator.    CSW to continue to follow and assist with pt discharge planning needs once pt plan of care further established.   Assessment/plan status:   Psychosocial Support/Ongoing Assessment of Needs Other assessment/ plan:   discharge planning   Information/referral to community resources:   pending results of PT/OT, PMT [place] meeting.    PATIENT'S/FAMILY'S RESPONSE TO PLAN OF CARE: Pt alert and oriented to person only. Pt very pleasant and smiling during assessment. CSW awaiting return phone call from pt guardian to gather more information regarding pt.    Alison Murray, MSW, [place] Work [phone]

## 2013-11-05 NOTE — Progress Notes (Signed)
CSW received another referral from Wadie Lessen, PMT NP for assisting with return to [facility], but SNF level of care.  CSW spoke with [facility] admission coordinator who confirmed that pt can return to [facility] and facility will have SNF bed available for pt when pt medically ready for discharge.   CSW faxed [facility] clinical information via [facility].  CSW received return phone call from pt guardian, Carol [name] who is agreeable to plan of pt returning to [facility] SNF level of care upon discharge. Per Ms. Georga Hacking, contact Ms. Fredrickson at [phone] to communicate about when pt may be medically ready for discharge.  CSW to continue to follow to assist with pt discharge planning needs to [facility] SNF when pt medically ready for discharge.  Alison Murray, MSW, [place] Work [phone]

## 2013-11-05 NOTE — Procedures (Signed)
EEG report.  Brief clinical history: 80 y.o. female with PMH of skin cancer status post radiation therapy, pheochromocytoma, hypertension and thyroid disease who was admitted on 11/05/13 after being found down and unresponsive at her independent living facility. It was estimated that she was potentially on the floor for 24 hours.  Technique: this is a 17 channel routine scalp EEG performed at the bedside with bipolar and monopolar montages arranged in accordance to the international 10/20 system of electrode placement. One channel was dedicated to EKG recording.  No sleep was recorded during the test. No activating procedures performed.  Description:the study is contaminated by significant amount of artifacts, but in the wakeful state, the best background consisted of a low amplitude, posterior dominant, poorly sustained sustained, symmetric and reactive 9 Hz rhythm. No focal or generalized epileptiform discharges noted.  No pathologic areas of slowing seen.  EKG showed sinus rhythm.  Impression: this is a normal awake EEG. Please, be aware that a normal EEG does not exclude the possibility of epilepsy.  Clinical correlation is advised.  Dorian Pod, MD

## 2013-11-05 NOTE — Progress Notes (Signed)
[facility] PROGRESS NOTE   Carol [name] [id number] DOB: Dec 04, 1927 DOA: 11/04/2013 PCP: [name], MD  Brief narrative: Carol [name] is an 80 y.o. female with PMH of skin cancer status post radiation therapy, pheochromocytoma, hypertension and thyroid disease who was admitted on 11/05/13 after being found down and unresponsive at her independent living facility. It was estimated that she was potentially on the floor for 24 hours. Upon initial evaluation in the emergency department, the patient was found to have rhabdomyolysis, possible UTI, and mildly elevated troponins. She has a MOST form detailing scope of treatment.  Assessment/Plan: Principal Problem:   NSTEMI (non-ST elevated myocardial infarction) Patient was noted to have elevated troponins. She was given aspirin per rectum and placed on a heparin drip. EKG showed normal sinus rhythm at 96 beats per minute with right atrial enlargement and prolonged QTc interval but no ST changes. The palliative care team was consulted given the patient's MOST form indicative of comfort measures only.  Patient's condition was discussed with her guardian, and the plan is to discontinue heparin, lab draws, and further diagnostic testing. Active Problems:   UTI (urinary tract infection) Patient is on empiric Rocephin. Followup culture results.   Acute encephalopathy Neurological exam is nonfocal. Cancel MRI. Encephalopathy likely from metabolic factors.   Fall with soft tissue injury to right hip and suprapubic area, head trauma / decubitus ulcer Admission radiographs done and as noted below. No fractures. Seen by wound care nurse. Eschar noted above suprapubic area and right hip, likely from prolonged exposure from fall. Would treat conservatively for now with observation and wound care.   Rhabdomyolysis / dehydration Continue IV fluids for 24 hours, but no further lab draws.   Protein-calorie malnutrition, severe Diet advanced. Comfort  feeds.  Code Status: DNR Family Communication: Guardian with Irena Reichmann of palliative care. Disposition Plan: SNF.   IV access:  Peripheral IV  Medical Consultants:  Irena Reichmann, NP, Palliative care  Other Consultants:  [place] RN  Anti-infectives:  Rocephin 11/05/13--->  HPI/Subjective: Carol [name] is pleasantly confused. She has no complaints but when her lower abdomen and hip are palpated, she does wince.  Objective: Filed Vitals:   11/04/13 2033 11/05/13 0100 11/05/13 0409 11/05/13 1000  BP: 108/59  99/60 139/78  Pulse: 102  99 102  Temp: 98.4 F (36.9 C)  98.3 F (36.8 C) 97.8 F (36.6 C)  TempSrc: Oral  Oral Oral  Resp: 18  16 16   Height:  5' 3.39" (1.61 m)    Weight: 43.727 kg (96 lb 6.4 oz)     SpO2: 91%  90% 98%   No intake or output data in the 24 hours ending 11/05/13 1142  Exam: Gen:  NAD Cardiovascular:  RRR, No M/R/G Respiratory:  Lungs CTAB Gastrointestinal:  Abdomen soft, NT/ND, + BS Skin: Eschar suprapubic area and right iliac crest with surrounding erythema Extremities:  Dry and flaky with venous stasis dermatitis Neuro: Oriented to self and place. Generalized weakness with no focal deficits appreciated. Tongue midline.  Data Reviewed: Basic Metabolic Panel:  Recent Labs Lab 11/04/13 1814 11/05/13 0400  NA 130* 130*  K 3.6* 3.5*  CL 85* 91*  CO2 27 26  GLUCOSE 101* 93  BUN 31* 45*  CREATININE 0.65 1.04  CALCIUM 9.3 8.6  MG 2.0  --    GFR Estimated Creatinine Clearance: 28.3 ml/min (by C-G formula based on Cr of 1.04). Liver Function Tests:  Recent Labs Lab 11/04/13 1814 11/05/13 0400  AST 185* 168*  ALT 32 29  ALKPHOS 83 63  BILITOT 0.9 0.4  PROT 7.5 6.1  ALBUMIN 3.8 3.1*   Coagulation profile  Recent Labs Lab 11/05/13 0530  INR 0.97    CBC:  Recent Labs Lab 11/04/13 1814 11/05/13 0400  WBC 6.6 6.6  NEUTROABS 5.6 5.3  HGB 17.8* 15.0  HCT 49.8* 42.7  MCV 92.7 93.4  PLT 159 164   Cardiac  Enzymes:  Recent Labs Lab 11/04/13 0030 11/04/13 1814 11/04/13 2230 11/05/13 0400 11/05/13 1010  CKTOTAL  --  6308*  --  4842*  --   TROPONINI 1.55*  --  1.83* 1.46* 1.60*   CBG:  Recent Labs Lab 11/04/13 2350 11/05/13 0400 11/05/13 0749  GLUCAP 92 88 80   Microbiology No results found for this or any previous visit (from the past 240 hour(s)).   Procedures and Diagnostic Studies: Dg Chest 1 View  11/04/2013   CLINICAL DATA:  Fall, weakness  EXAM: CHEST - 1 VIEW  COMPARISON:  02/28/2012  FINDINGS: Hyperinflation noted without CHF or pneumonia. No effusion or pneumothorax. Trachea midline. Normal vascularity. Atherosclerosis of the aorta. Trachea is midline.  IMPRESSION: Stable hyperinflation.  No acute process   Electronically Signed   By: Daryll Brod M.D.   On: 11/04/2013 18:46   Ct Head Wo Contrast  11/04/2013   CLINICAL DATA:  Fall  EXAM: CT HEAD WITHOUT CONTRAST  CT MAXILLOFACIAL WITHOUT CONTRAST  CT CERVICAL SPINE WITHOUT CONTRAST  TECHNIQUE: Multidetector CT imaging of the head, cervical spine, and maxillofacial structures were performed using the standard protocol without intravenous contrast. Multiplanar CT image reconstructions of the cervical spine and maxillofacial structures were also generated.  COMPARISON:  None.  FINDINGS: CT HEAD FINDINGS  No evidence of parenchymal hemorrhage or extra-axial fluid collection. No mass lesion, mass effect, or midline shift.  No CT evidence of acute infarction.  Subcortical white matter and periventricular small vessel ischemic changes. Intracranial atherosclerosis.  Mild age related atrophy.  No ventriculomegaly.  The visualized paranasal sinuses are essentially clear. The mastoid air cells are unopacified.  Mild soft tissue swelling overlying the right frontal bone (series 17/image 8).  No evidence of calvarial fracture.  CT MAXILLOFACIAL FINDINGS  Mild soft tissue swelling overlying the right frontal bone/lateral orbit.  No evidence of  maxillofacial fracture.  Bilateral orbits, including the globes and retroconal soft tissues, are within normal limits.  The visualized paranasal sinuses are essentially clear. The mastoid air cells are unopacified.  CT CERVICAL SPINE FINDINGS  Reversal of the normal cervical lordosis.  No evidence of fracture or dislocation. Vertebral body heights are maintained. Dens appears intact.  No prevertebral soft tissue swelling.  Moderate degenerative changes at C5-6 and C6-7.  Visualized thyroid is unremarkable.  Visualized lung apices are notable for biapical pleural parenchymal scarring, right greater than left.  IMPRESSION: Mild soft tissue swelling overlying the right frontal bone/orbit.  No evidence of acute intracranial abnormality. Atrophy with small vessel ischemic changes and intracranial atherosclerosis.  No evidence of maxillofacial fracture.  No evidence of traumatic injury to the cervical spine. Moderate multilevel degenerative changes.   Electronically Signed   By: Julian Hy M.D.   On: 11/04/2013 18:52   Ct Cervical Spine Wo Contrast  11/04/2013   CLINICAL DATA:  Fall  EXAM: CT HEAD WITHOUT CONTRAST  CT MAXILLOFACIAL WITHOUT CONTRAST  CT CERVICAL SPINE WITHOUT CONTRAST  TECHNIQUE: Multidetector CT imaging of the head, cervical spine, and maxillofacial structures were performed using the standard protocol without  intravenous contrast. Multiplanar CT image reconstructions of the cervical spine and maxillofacial structures were also generated.  COMPARISON:  None.  FINDINGS: CT HEAD FINDINGS  No evidence of parenchymal hemorrhage or extra-axial fluid collection. No mass lesion, mass effect, or midline shift.  No CT evidence of acute infarction.  Subcortical white matter and periventricular small vessel ischemic changes. Intracranial atherosclerosis.  Mild age related atrophy.  No ventriculomegaly.  The visualized paranasal sinuses are essentially clear. The mastoid air cells are unopacified.  Mild  soft tissue swelling overlying the right frontal bone (series 17/image 8).  No evidence of calvarial fracture.  CT MAXILLOFACIAL FINDINGS  Mild soft tissue swelling overlying the right frontal bone/lateral orbit.  No evidence of maxillofacial fracture.  Bilateral orbits, including the globes and retroconal soft tissues, are within normal limits.  The visualized paranasal sinuses are essentially clear. The mastoid air cells are unopacified.  CT CERVICAL SPINE FINDINGS  Reversal of the normal cervical lordosis.  No evidence of fracture or dislocation. Vertebral body heights are maintained. Dens appears intact.  No prevertebral soft tissue swelling.  Moderate degenerative changes at C5-6 and C6-7.  Visualized thyroid is unremarkable.  Visualized lung apices are notable for biapical pleural parenchymal scarring, right greater than left.  IMPRESSION: Mild soft tissue swelling overlying the right frontal bone/orbit.  No evidence of acute intracranial abnormality. Atrophy with small vessel ischemic changes and intracranial atherosclerosis.  No evidence of maxillofacial fracture.  No evidence of traumatic injury to the cervical spine. Moderate multilevel degenerative changes.   Electronically Signed   By: Julian Hy M.D.   On: 11/04/2013 18:52   Ct Maxillofacial Wo Cm  11/04/2013   CLINICAL DATA:  Fall  EXAM: CT HEAD WITHOUT CONTRAST  CT MAXILLOFACIAL WITHOUT CONTRAST  CT CERVICAL SPINE WITHOUT CONTRAST  TECHNIQUE: Multidetector CT imaging of the head, cervical spine, and maxillofacial structures were performed using the standard protocol without intravenous contrast. Multiplanar CT image reconstructions of the cervical spine and maxillofacial structures were also generated.  COMPARISON:  None.  FINDINGS: CT HEAD FINDINGS  No evidence of parenchymal hemorrhage or extra-axial fluid collection. No mass lesion, mass effect, or midline shift.  No CT evidence of acute infarction.  Subcortical white matter and  periventricular small vessel ischemic changes. Intracranial atherosclerosis.  Mild age related atrophy.  No ventriculomegaly.  The visualized paranasal sinuses are essentially clear. The mastoid air cells are unopacified.  Mild soft tissue swelling overlying the right frontal bone (series 17/image 8).  No evidence of calvarial fracture.  CT MAXILLOFACIAL FINDINGS  Mild soft tissue swelling overlying the right frontal bone/lateral orbit.  No evidence of maxillofacial fracture.  Bilateral orbits, including the globes and retroconal soft tissues, are within normal limits.  The visualized paranasal sinuses are essentially clear. The mastoid air cells are unopacified.  CT CERVICAL SPINE FINDINGS  Reversal of the normal cervical lordosis.  No evidence of fracture or dislocation. Vertebral body heights are maintained. Dens appears intact.  No prevertebral soft tissue swelling.  Moderate degenerative changes at C5-6 and C6-7.  Visualized thyroid is unremarkable.  Visualized lung apices are notable for biapical pleural parenchymal scarring, right greater than left.  IMPRESSION: Mild soft tissue swelling overlying the right frontal bone/orbit.  No evidence of acute intracranial abnormality. Atrophy with small vessel ischemic changes and intracranial atherosclerosis.  No evidence of maxillofacial fracture.  No evidence of traumatic injury to the cervical spine. Moderate multilevel degenerative changes.   Electronically Signed   By: Henderson Newcomer.D.  On: 11/04/2013 18:52    Scheduled Meds: . beta carotene w/minerals  1 tablet Oral Daily  . cefTRIAXone (ROCEPHIN)  IV  1 g Intravenous Q24H  . omega-3 acid ethyl esters  1 g Oral Daily  . triamcinolone cream  1 application Topical BID   Continuous Infusions: . sodium chloride 125 mL/hr at 11/04/13 2259    Time spent: 35 minutes including coordination of care with the palliative care team.   LOS: 1 day   [facility] Pager [phone].    *Please note that the hospitalists switch teams on Wednesdays. Please call the flow manager at [phone] if you are having difficulty reaching the hospitalist taking care of this patient as she can update you and provide the most up-to-date pager number of provider caring for the patient. If 8PM-8AM, please contact night-coverage at [email or web address], password [facility]  11/05/2013, 11:42 AM

## 2013-11-06 DIAGNOSIS — I214 Non-ST elevation (NSTEMI) myocardial infarction: Secondary | ICD-10-CM

## 2013-11-06 LAB — URINE CULTURE: Colony Count: >=100000

## 2013-11-06 MED ORDER — ACETAMINOPHEN 325 MG PO TABS: 650.0000 mg | ORAL_TABLET | ORAL | Status: AC | PRN

## 2013-11-06 MED ORDER — ASPIRIN 81 MG PO TABS: 81.0000 mg | ORAL_TABLET | Freq: Every day | ORAL | Status: AC

## 2013-11-06 MED ORDER — CEFUROXIME AXETIL 500 MG PO TABS: 500.0000 mg | ORAL_TABLET | Freq: Two times a day (BID) | ORAL | Status: DC

## 2013-11-06 MED ORDER — ENSURE COMPLETE PO LIQD: 237.0000 mL | Freq: Two times a day (BID) | ORAL | Status: DC

## 2013-11-06 MED ORDER — LORAZEPAM 2 MG/ML IJ SOLN: 0.5000 mg | INTRAMUSCULAR | Status: DC | PRN

## 2013-11-06 MED ORDER — LORAZEPAM 0.5 MG PO TABS: 0.5000 mg | ORAL_TABLET | Freq: Three times a day (TID) | ORAL | Status: DC

## 2013-11-06 NOTE — Progress Notes (Signed)
Patient is set to discharge to [facility] SNF today. CSW left message for patient's guardian, Carol [name] (ph#: [phone]) to make her aware. Discharge packet in [place], [place] aware. [facility] scheduled for 11:45p pickup.   Clinical Social Work Department CLINICAL SOCIAL WORK PLACEMENT NOTE 11/06/2013  Patient:  Carol [name], Carol [name]  Account Number:  [id number] [place] date:  11/04/2013  Clinical Social Worker:  Ulyess Blossom  Date/time:  11/05/2013 04:02 PM  Clinical Social Work is seeking post-discharge placement for this patient at the following level of care:   SKILLED NURSING   (*CSW will update this form in Epic as items are completed)   11/05/2013  Patient/family provided with [place] Department of Clinical Social Work's list of facilities offering this level of care within the geographic area requested by the patient (or if unable, by the patient's family).  11/05/2013  Patient/family informed of their freedom to choose among providers that offer the needed level of care, that participate in Medicare, Medicaid or managed care program needed by the patient, have an available bed and are willing to accept the patient.  11/05/2013  Patient/family informed of [facility]' ownership interest in [facility], as well as of the fact that they are under no obligation to receive care at this facility.  PASARR submitted to EDS on 11/05/2013 PASARR number received from EDS on 11/05/2013  FL2 transmitted to all facilities in geographic area requested by pt/family on  11/05/2013 FL2 transmitted to all facilities within larger geographic area on   Patient informed that his/her managed care company has contracts with or will negotiate with  certain facilities, including the following:     Patient/family informed of bed offers received:  11/06/2013 Patient chooses bed at [facility] Physician recommends and patient chooses bed at    Patient to be transferred  to [facility] on  11/06/2013 Patient to be transferred to facility by [facility]  The following physician request were entered in Epic:   Additional Comments:   Raynaldo Opitz, [place] Worker cell #: [phone]

## 2013-11-06 NOTE — Discharge Summary (Signed)
Physician Discharge Summary  Carol [name] [id number] DOB: 12/06/1927 DOA: 11/04/2013  PCP: [name], MD  Admit date: 11/04/2013 Discharge date: 11/06/2013  Recommendations for Outpatient Follow-up:  1. Wound care recommendations: Apply soft silicone dressing to right iliac crest wound and leave suprapubic wound open to air with frequent observation for worsening. 2. MOST form on chart: Scope of care is primarily comfort oriented. 3. Followup final urine culture results/sensitivities. Preliminary report shows greater than 100,000 colonies of Escherichia coli.  Discharge Diagnoses:  Principal Problem:    NSTEMI (non-ST elevated myocardial infarction) Active Problems:    UTI (urinary tract infection)    Acute encephalopathy    Decubitus ulcer    Rhabdomyolysis    Dehydration    Protein-calorie malnutrition, severe    Soft tissue injury to right hip and suprapubic area    Palliative care encounter    Weakness generalized   Discharge Condition: Stable.  Diet recommendation: Dysphagia I with thin liquids, regular.  History of present illness:  Carol [name] is an 80 y.o. female with PMH of skin cancer status post radiation therapy, pheochromocytoma, hypertension and thyroid disease who was admitted on 11/05/13 after being found down and unresponsive at her independent living facility. It was estimated that she was potentially on the floor for 24 hours. Upon initial evaluation in the emergency department, the patient was found to have rhabdomyolysis, possible UTI, and mildly elevated troponins. She has a MOST form detailing scope of treatment.  Hospital Course by problem:  Principal Problem:  NSTEMI (non-ST elevated myocardial infarction)  Patient was noted to have elevated troponins. She was given aspirin per rectum and placed on a heparin drip. EKG showed normal sinus rhythm at 96 beats per minute with right atrial enlargement and prolonged QTc interval but no ST  changes. The palliative care team was consulted given the patient's MOST form indicative of comfort measures only. Patient's condition was discussed with her guardian, and the plan is to discontinue heparin, lab draws, and further diagnostic testing. Will D/C on ASA. Active Problems:  UTI (urinary tract infection)  Patient is on empiric Rocephin. Preliminary cultures growing Escherichia coli. We'll discharge home on an additional 6 days of therapy with Ceftin.  Acute encephalopathy  Neurological exam is nonfocal. MRI was subsequently found to not be necessary. EEG was within normal limits. Encephalopathy was felt to be from metabolic factors. Patient appears back to her baseline. Fall with soft tissue injury to right hip and suprapubic area, head trauma / decubitus ulcer  Admission radiographs done and as noted below. No fractures. Seen by wound care nurse. Eschar noted above suprapubic area and right hip, likely from prolonged exposure from fall. Would treat conservatively for now with observation and wound care.  Rhabdomyolysis / dehydration  Treated conservatively with IV fluids while in the hospital, but no further lab draws were obtained in light of the patient's wishes for comfort care.  Protein-calorie malnutrition, severe  Continue dysphagia 1 diet. Comfort feeds.   Consultations:  Irena Reichmann, NP, Palliative care  Discharge Exam: Filed Vitals:   11/06/13 0436  BP: 168/80  Pulse: 96  Temp: 98 F (36.7 C)  Resp: 15   Filed Vitals:   11/05/13 1000 11/05/13 1400 11/05/13 2100 11/06/13 0436  BP: 139/78 145/63 176/88 168/80  Pulse: 102 97 105 96  Temp: 97.8 F (36.6 C) 97.8 F (36.6 C) 99 F (37.2 C) 98 F (36.7 C)  TempSrc: Oral Oral Oral Oral  Resp: 16 16 18  15  Height:      Weight:      SpO2: 98% 100% 96% 94%    Gen:  NAD Cardiovascular:  RRR, No M/R/G Respiratory: Lungs CTAB Gastrointestinal: Abdomen soft, NT/ND with normal active bowel sounds. Extremities: No  C/E/C   Discharge Instructions  Discharge Orders   Future Appointments Provider Department Dept Phone   11/10/2013 8:30 AM [name], MD [facility] [phone]   12/29/2013 9:30 AM [name], MD [facility] [phone]   Future Orders Complete By Expires   Call MD for:  persistant nausea and vomiting  As directed    Call MD for:  severe uncontrolled pain  As directed    Call MD for:  temperature >100.4  As directed    Diet general  As directed    Discharge instructions  As directed    Comments:     You were cared for by Dr. Jacquelynn Cree  (a hospitalist) during your hospital stay. If you have any questions about your discharge medications or the care you received while you were in the hospital after you are discharged, you can call the unit and ask to speak with the hospitalist on call if the hospitalist that took care of you is not available. Once you are discharged, your primary care physician will handle any further medical issues. Please note that NO REFILLS for any discharge medications will be authorized once you are discharged, as it is imperative that you return to your primary care physician (or establish a relationship with a primary care physician if you do not have one) for your aftercare needs so that they can reassess your need for medications and monitor your lab values.  Any outstanding tests can be reviewed by your PCP at your follow up visit.  It is also important to review any medicine changes with your PCP.  Please bring these d/c instructions with you to your next visit so your physician can review these changes with you.  If you do not have a primary care physician, you can call [phone] for a physician referral.  It is highly recommended that you obtain a PCP for hospital follow up.   Increase activity slowly  As directed    Walk with assistance  As directed    Walker   As directed        Medication List         acetaminophen 325 MG  tablet  Commonly known as:  TYLENOL  Take 2 tablets (650 mg total) by mouth every 4 (four) hours as needed for mild pain (temperature >/= 99.5 F).     beta carotene w/minerals tablet  Take 1 tablet by mouth daily. For eyes     cefUROXime 500 MG tablet  Commonly known as:  CEFTIN  Take 1 tablet (500 mg total) by mouth 2 (two) times daily with a meal.     feeding supplement (ENSURE COMPLETE) Liqd  Take 237 mLs by mouth 2 (two) times daily between meals.     Fish Oil 1000 MG Caps  Take by mouth. Take one capsule a day     polyethylene glycol packet  Commonly known as:  MIRALAX / GLYCOLAX  Take 17 g by mouth daily as needed.     triamcinolone cream 0.1 %  Commonly known as:  KENALOG  Apply 1 application topically 2 (two) times daily.           Follow-up Information   Follow up with GREEN,  Viviann Spare, MD. (As needed)    Specialty:  Internal Medicine   Contact information:   [place] [address] [phone]        The results of significant diagnostics from this hospitalization (including imaging, microbiology, ancillary and laboratory) are listed below for reference.    Significant Diagnostic Studies: Dg Chest 1 View  11/04/2013   CLINICAL DATA:  Fall, weakness  EXAM: CHEST - 1 VIEW  COMPARISON:  02/28/2012  FINDINGS: Hyperinflation noted without CHF or pneumonia. No effusion or pneumothorax. Trachea midline. Normal vascularity. Atherosclerosis of the aorta. Trachea is midline.  IMPRESSION: Stable hyperinflation.  No acute process   Electronically Signed   By: Daryll Brod M.D.   On: 11/04/2013 18:46   Ct Head Wo Contrast  11/04/2013   CLINICAL DATA:  Fall  EXAM: CT HEAD WITHOUT CONTRAST  CT MAXILLOFACIAL WITHOUT CONTRAST  CT CERVICAL SPINE WITHOUT CONTRAST  TECHNIQUE: Multidetector CT imaging of the head, cervical spine, and maxillofacial structures were performed using the standard protocol without intravenous contrast. Multiplanar CT image  reconstructions of the cervical spine and maxillofacial structures were also generated.  COMPARISON:  None.  FINDINGS: CT HEAD FINDINGS  No evidence of parenchymal hemorrhage or extra-axial fluid collection. No mass lesion, mass effect, or midline shift.  No CT evidence of acute infarction.  Subcortical white matter and periventricular small vessel ischemic changes. Intracranial atherosclerosis.  Mild age related atrophy.  No ventriculomegaly.  The visualized paranasal sinuses are essentially clear. The mastoid air cells are unopacified.  Mild soft tissue swelling overlying the right frontal bone (series 17/image 8).  No evidence of calvarial fracture.  CT MAXILLOFACIAL FINDINGS  Mild soft tissue swelling overlying the right frontal bone/lateral orbit.  No evidence of maxillofacial fracture.  Bilateral orbits, including the globes and retroconal soft tissues, are within normal limits.  The visualized paranasal sinuses are essentially clear. The mastoid air cells are unopacified.  CT CERVICAL SPINE FINDINGS  Reversal of the normal cervical lordosis.  No evidence of fracture or dislocation. Vertebral body heights are maintained. Dens appears intact.  No prevertebral soft tissue swelling.  Moderate degenerative changes at C5-6 and C6-7.  Visualized thyroid is unremarkable.  Visualized lung apices are notable for biapical pleural parenchymal scarring, right greater than left.  IMPRESSION: Mild soft tissue swelling overlying the right frontal bone/orbit.  No evidence of acute intracranial abnormality. Atrophy with small vessel ischemic changes and intracranial atherosclerosis.  No evidence of maxillofacial fracture.  No evidence of traumatic injury to the cervical spine. Moderate multilevel degenerative changes.   Electronically Signed   By: Julian Hy M.D.   On: 11/04/2013 18:52   Ct Cervical Spine Wo Contrast  11/04/2013   CLINICAL DATA:  Fall  EXAM: CT HEAD WITHOUT CONTRAST  CT MAXILLOFACIAL WITHOUT CONTRAST   CT CERVICAL SPINE WITHOUT CONTRAST  TECHNIQUE: Multidetector CT imaging of the head, cervical spine, and maxillofacial structures were performed using the standard protocol without intravenous contrast. Multiplanar CT image reconstructions of the cervical spine and maxillofacial structures were also generated.  COMPARISON:  None.  FINDINGS: CT HEAD FINDINGS  No evidence of parenchymal hemorrhage or extra-axial fluid collection. No mass lesion, mass effect, or midline shift.  No CT evidence of acute infarction.  Subcortical white matter and periventricular small vessel ischemic changes. Intracranial atherosclerosis.  Mild age related atrophy.  No ventriculomegaly.  The visualized paranasal sinuses are essentially clear. The mastoid air cells are unopacified.  Mild soft tissue swelling overlying the  right frontal bone (series 17/image 8).  No evidence of calvarial fracture.  CT MAXILLOFACIAL FINDINGS  Mild soft tissue swelling overlying the right frontal bone/lateral orbit.  No evidence of maxillofacial fracture.  Bilateral orbits, including the globes and retroconal soft tissues, are within normal limits.  The visualized paranasal sinuses are essentially clear. The mastoid air cells are unopacified.  CT CERVICAL SPINE FINDINGS  Reversal of the normal cervical lordosis.  No evidence of fracture or dislocation. Vertebral body heights are maintained. Dens appears intact.  No prevertebral soft tissue swelling.  Moderate degenerative changes at C5-6 and C6-7.  Visualized thyroid is unremarkable.  Visualized lung apices are notable for biapical pleural parenchymal scarring, right greater than left.  IMPRESSION: Mild soft tissue swelling overlying the right frontal bone/orbit.  No evidence of acute intracranial abnormality. Atrophy with small vessel ischemic changes and intracranial atherosclerosis.  No evidence of maxillofacial fracture.  No evidence of traumatic injury to the cervical spine. Moderate multilevel  degenerative changes.   Electronically Signed   By: Julian Hy M.D.   On: 11/04/2013 18:52   Ct Maxillofacial Wo Cm  11/04/2013   CLINICAL DATA:  Fall  EXAM: CT HEAD WITHOUT CONTRAST  CT MAXILLOFACIAL WITHOUT CONTRAST  CT CERVICAL SPINE WITHOUT CONTRAST  TECHNIQUE: Multidetector CT imaging of the head, cervical spine, and maxillofacial structures were performed using the standard protocol without intravenous contrast. Multiplanar CT image reconstructions of the cervical spine and maxillofacial structures were also generated.  COMPARISON:  None.  FINDINGS: CT HEAD FINDINGS  No evidence of parenchymal hemorrhage or extra-axial fluid collection. No mass lesion, mass effect, or midline shift.  No CT evidence of acute infarction.  Subcortical white matter and periventricular small vessel ischemic changes. Intracranial atherosclerosis.  Mild age related atrophy.  No ventriculomegaly.  The visualized paranasal sinuses are essentially clear. The mastoid air cells are unopacified.  Mild soft tissue swelling overlying the right frontal bone (series 17/image 8).  No evidence of calvarial fracture.  CT MAXILLOFACIAL FINDINGS  Mild soft tissue swelling overlying the right frontal bone/lateral orbit.  No evidence of maxillofacial fracture.  Bilateral orbits, including the globes and retroconal soft tissues, are within normal limits.  The visualized paranasal sinuses are essentially clear. The mastoid air cells are unopacified.  CT CERVICAL SPINE FINDINGS  Reversal of the normal cervical lordosis.  No evidence of fracture or dislocation. Vertebral body heights are maintained. Dens appears intact.  No prevertebral soft tissue swelling.  Moderate degenerative changes at C5-6 and C6-7.  Visualized thyroid is unremarkable.  Visualized lung apices are notable for biapical pleural parenchymal scarring, right greater than left.  IMPRESSION: Mild soft tissue swelling overlying the right frontal bone/orbit.  No evidence of acute  intracranial abnormality. Atrophy with small vessel ischemic changes and intracranial atherosclerosis.  No evidence of maxillofacial fracture.  No evidence of traumatic injury to the cervical spine. Moderate multilevel degenerative changes.   Electronically Signed   By: Julian Hy M.D.   On: 11/04/2013 18:52   11/05/13: EEG: IMPRESSION: This is a normal awake EEG.   Labs:  Basic Metabolic Panel:  Recent Labs Lab 11/04/13 1814 11/05/13 0400  NA 130* 130*  K 3.6* 3.5*  CL 85* 91*  CO2 27 26  GLUCOSE 101* 93  BUN 31* 45*  CREATININE 0.65 1.04  CALCIUM 9.3 8.6  MG 2.0  --    GFR Estimated Creatinine Clearance: 28.3 ml/min (by C-G formula based on Cr of 1.04). Liver Function Tests:  Recent Labs  Lab 11/04/13 1814 11/05/13 0400  AST 185* 168*  ALT 32 29  ALKPHOS 83 63  BILITOT 0.9 0.4  PROT 7.5 6.1  ALBUMIN 3.8 3.1*   Coagulation profile  Recent Labs Lab 11/05/13 0530  INR 0.97    CBC:  Recent Labs Lab 11/04/13 1814 11/05/13 0400  WBC 6.6 6.6  NEUTROABS 5.6 5.3  HGB 17.8* 15.0  HCT 49.8* 42.7  MCV 92.7 93.4  PLT 159 164   Cardiac Enzymes:  Recent Labs Lab 11/04/13 0030 11/04/13 1814 11/04/13 2230 11/05/13 0400 11/05/13 1010  CKTOTAL  --  6308*  --  4842*  --   TROPONINI 1.55*  --  1.83* 1.46* 1.60*   CBG:  Recent Labs Lab 11/04/13 2350 11/05/13 0400 11/05/13 0749  GLUCAP 92 88 80   Microbiology Recent Results (from the past 240 hour(s))  URINE CULTURE     Status: None   Collection Time    11/04/13  6:11 PM      Result Value Range Status   Specimen Description URINE, CLEAN CATCH   Final   Special Requests NONE   Final   Culture  Setup Time     Final   Value: 11/04/2013 22:30     Performed at [facility]     Final   Value: >=100,000 COLONIES/ML     Performed at [facility]   Culture     Final   Value: ESCHERICHIA COLI     Performed at [facility]   Report Status PENDING   Incomplete     Time coordinating discharge: 35 minutes.  Signed:  RAMA,CHRISTINA  Pager [phone] [facility] 11/06/2013, 9:25 AM

## 2013-11-06 NOTE — Discharge Instructions (Signed)
Pressure Ulcer A pressure ulcer is a sore that has formed from the breakdown of skin and exposure of deeper layers of tissue. It develops in areas of the body where there is unrelieved pressure. Pressure ulcers are usually found over a bony area, such as the shoulder blades, spine, lower back, hips, knees, ankles, and heels. Pressure ulcers vary in severity. Your health care provider may determine the severity (stage) of your pressure ulcer. The stages include:  Stage I The skin is red, and when the skin is pressed, it stays red.  Stage II The top layer of skin is gone, and there is a shallow, pink ulcer.  Stage III The ulcer becomes deeper, and it is more difficult to see the whole wound. Also, there may be yellow or brown parts, as well as pink and red parts.  Stage IV The ulcer may be deep and red, pink, brown, white, or yellow. Bone or muscle may be seen.  Unstageable pressure ulcer The ulcer is covered almost completely with black, brown, or yellow tissue. It is not known how deep the ulcer is or what stage it is until this covering comes off.  Suspected deep tissue injury A person's skin can be injured from pressure or pulling on the skin when his or her position is changed. The skin appears purple or maroon. There may not be an opening in the skin, but there could be a blood-filled blister. This deep tissue injury is often difficult to see in people with darker skin tones. The site may open and become deeper in time. However, early interventions will help the area heal and may prevent the area from opening. CAUSES  Pressure ulcers are caused by pressure against the skin that limits the flow of blood to the skin and nearby tissues. There are many risk factors that can lead to pressure sores. RISK FACTORS  Decreased ability to move.  Decreased ability to feel pain or discomfort.  Excessive skin moisture from urine, stool, sweat, or secretions.  Poor  nutrition.  Dehydration.  Tobacco, drug, or alcohol abuse.  Having someone pull on bedsheets that are under you, such as when health care workers are changing your position in a hospital bed.  Obesity.  Increased adult age.  Age of less than 2 years.  Premature newborns.  Hospitalization in a critical care unit for longer than 4 days with use of medical devices.  Prolonged use of medical devices.  Critical illness.  Anemia.  Traumatic brain injury.  Spinal cord injury.  Stroke.  Diabetes.  Poor blood glucose control.  Low blood pressure (hypotension).  Low oxygen levels.  Medicines that reduce blood flow.  Infection. DIAGNOSIS  Your health care provider will diagnose your pressure ulcer based on its appearance. The health care provider may determine the stage of your pressure ulcer as well. Tests may be done to check for infection, to assess your circulation, or to check for other diseases, such as diabetes. TREATMENT  Treatment of your pressure ulcer begins with determining what stage the ulcer is in. Your treatment team may include your health care provider, a wound care specialist, a nutritionist, a physical therapist, and a surgeon. Possible treatments may include:   Moving or repositioning every 1 2 hours.  Using beds or mattresses to shift your body weight and pressure points frequently.  Improving your diet.  Cleaning and bandaging (dressing) the open wound.  Giving antibiotic medicines.  Removing damaged tissue.  Surgery and sometimes skin grafts. HOME CARE   INSTRUCTIONS  If you were hospitalized, follow the care plan that was started in the hospital.  Avoid staying in the same position for more than 2 hours. Use padding, devices, or mattresses to cushion your pressure points as directed by your health care provider.  Eat a well-balanced diet. Take nutritional supplements and vitamins as directed by your health care provider.  Keep all  follow-up appointments.  Only take over-the-counter or prescription medicines for pain, fever, or discomfort as directed by your health care provider. SEEK MEDICAL CARE IF:   Your pressure ulcer is not improving.  You do not know how to care for your pressure ulcer.  You notice other areas of redness on your skin. SEEK IMMEDIATE MEDICAL CARE IF:   You have increasing redness, swelling, or pain in your pressure ulcer.  You notice pus coming from your pressure ulcer.  You have a fever.  You notice a bad smell coming from the wound or dressing.  Your pressure ulcer opens up again. Document Released: 09/24/2005 Document Revised: 07/15/2013 Document Reviewed: 06/01/2013 [employer] Patient Information 2014 [employer].  

## 2013-11-10 ENCOUNTER — Non-Acute Institutional Stay (SKILLED_NURSING_FACILITY): Payer: Medicare Other | Admitting: Nurse Practitioner

## 2013-11-10 ENCOUNTER — Encounter: Payer: Medicare Other | Admitting: Internal Medicine

## 2013-11-10 DIAGNOSIS — IMO0002 Reserved for concepts with insufficient information to code with codable children: Secondary | ICD-10-CM

## 2013-11-10 DIAGNOSIS — T1490XA Injury, unspecified, initial encounter: Secondary | ICD-10-CM

## 2013-11-10 DIAGNOSIS — T148XXA Other injury of unspecified body region, initial encounter: Secondary | ICD-10-CM

## 2013-11-10 DIAGNOSIS — K5900 Constipation, unspecified: Secondary | ICD-10-CM

## 2013-11-10 DIAGNOSIS — N390 Urinary tract infection, site not specified: Secondary | ICD-10-CM

## 2013-11-10 DIAGNOSIS — F411 Generalized anxiety disorder: Secondary | ICD-10-CM

## 2013-11-10 DIAGNOSIS — E039 Hypothyroidism, unspecified: Secondary | ICD-10-CM

## 2013-11-10 DIAGNOSIS — I214 Non-ST elevation (NSTEMI) myocardial infarction: Secondary | ICD-10-CM

## 2013-11-11 ENCOUNTER — Encounter: Payer: Self-pay | Admitting: Nurse Practitioner

## 2013-11-11 NOTE — Progress Notes (Signed)
Patient ID: Carol [name], female   DOB: Mar 23, 1928, 80 y.o.   MRN: [id number]   Code Status: DNR  No Known Allergies  Chief Complaint  Patient presents with  . Medical Managment of Chronic Issues    abrasion wounds.   . Acute Visit  . Hospitalization Follow-up    HPI: Patient is a 80 y.o. female seen in the SNF at [facility] today for evaluation of s/p hospitalization for UTI, Rhabdomyolysis(Treated conservatively with IV fluids while in the hospital-f/u Bun/creatinine 22/0.4 11/12/13), pressure ulcers, NSTEMI, and other chronic medical conditions. Hospitalized from 11/04/2013 to 11/06/2013. She has a PMH of skin cancer status post radiation therapy, pheochromocytoma, hypertension and thyroid disease. She presented to ED after being found down and unresponsive at her independent living facility. It was estimated that she was potentially on the floor for 24 hours.  Her Acute encephalopathy: Neurological exam is nonfocal. MRI was subsequently found to not be necessary. EEG was within normal limits. Encephalopathy was felt to be from metabolic factors. Patient appears back to her baseline. Has palliative consult while in hospital: Irena Reichmann, NP, Palliative care-comfort measures.   Problem List Items Addressed This Visit   Abrasion - Primary     Left great toe and left knee abrasion-presently cleansed with NS and apply ABT ointment.     Generalized anxiety disorder     Stable, able to change her Lorazepam to 0.5mg  q8hr prn from routine.     NSTEMI (non-ST elevated myocardial infarction)     Patient was noted to have elevated troponins. She was given aspirin per rectum and placed on a heparin drip. EKG showed normal sinus rhythm at 96 beats per minute with right atrial enlargement and prolonged QTc interval but no ST changes. The palliative care team was consulted given the patient's MOST form indicative of comfort measures only. Patient's condition was discussed with her guardian, and the  plan is to discontinue heparin, lab draws, and further diagnostic testing. discontinued ASA.     Soft tissue injury to right hip and suprapubic area     Presently treated with ABT ointment-developing into pressure ulcer-may consider Hydrogel and Alginate wet to moist dressing to debride. Fall with soft tissue injury to right hip and suprapubic area, head trauma / decubitus ulcer: Eschar noted above suprapubic area and right hip, likely from prolonged exposure from fall.     Unspecified constipation     Prn MiraLax is adequate.     Unspecified hypothyroidism     TSH 2.238 11/12/13, not taking thyroid supplement.     UTI (urinary tract infection)     Preliminary cultures growing Escherichia coli. Discharge home on an additional 6 days of therapy with Ceftin. Urinary culture showed susceptible to cephalosporins         Review of Systems:  Review of Systems  Constitutional: Positive for malaise/fatigue. Negative for fever, chills, weight loss and diaphoresis.  HENT: Positive for hearing loss. Negative for congestion, ear discharge, ear pain, nosebleeds, sore throat and tinnitus.   Eyes: Negative for blurred vision, double vision, photophobia, pain, discharge and redness.  Respiratory: Negative for cough, hemoptysis, sputum production, shortness of breath, wheezing and stridor.   Cardiovascular: Negative for chest pain, palpitations, orthopnea, claudication, leg swelling and PND.  Gastrointestinal: Negative for heartburn, nausea, vomiting, abdominal pain, diarrhea, constipation, blood in stool and melena.  Genitourinary: Negative for dysuria, urgency, frequency, hematuria and flank pain.  Musculoskeletal: Positive for falls and joint pain. Negative for back pain, myalgias and  neck pain.  Skin: Negative for itching and rash.       Abrasion left knee and left great toe. Soft tissue injury the right lower abd and suprapubic area noted infected but developing into pressure ulcer.   Neurological:  Negative for dizziness, tingling, tremors, sensory change, speech change, focal weakness, seizures, loss of consciousness, weakness and headaches.  Endo/Heme/Allergies: Negative for environmental allergies and polydipsia. Does not bruise/bleed easily.  Psychiatric/Behavioral: Positive for memory loss. Negative for depression, suicidal ideas, hallucinations and substance abuse. The patient is nervous/anxious. The patient does not have insomnia.      Past Medical History  Diagnosis Date  . AK (actinic keratosis)     right tip of nose and right posterior leg  . Cancer 08/07/12    squamous cell ca,keratoacanthoma-right posterior leg  . Skin cancer 06/2011    scc right elbow tx included excision  . Radiation 09/10/2012-10/24/2012    30 fractions to right posterior calf  . Thyroid disease   . Anemia   . Hypertension   . Varicose vein of leg   . Hemorrhoids   . Spastic colon   . Insomnia, unspecified   . Unspecified urinary incontinence   . Hypoxemia   . Unspecified hypothyroidism   . Abnormality of gait   . Arthritis   . Osteoarthrosis, unspecified whether generalized or localized, unspecified site   . Radiation 09/10/12-10/24/12    Squamous cell right posterior calf 6000 cGy   Past Surgical History  Procedure Laterality Date  . Abdominal hysterectomy      early 40's, abdominal  . Appendectomy    . Anterior heel repair    . Total hip arthroplasty  07/31/2010    right  . Pheochromocytoma      left  . Fracture surgery  2003    left   . Wrist fracture surgery  2003    left Dr. Newman Nip   Social History:   reports that she quit smoking about 35 years ago. Her smoking use included Cigarettes. She has a 5 pack-year smoking history. She has never used smokeless tobacco. She reports that she drinks about 1.8 ounces of alcohol per week. She reports that she does not use illicit drugs.  Family History  Problem Relation Age of Onset  . Stroke Mother   . Stroke Father      Medications: Patient's Medications  New Prescriptions   No medications on file  Previous Medications   ACETAMINOPHEN (TYLENOL) 325 MG TABLET    Take 2 tablets (650 mg total) by mouth every 4 (four) hours as needed for mild pain (temperature >/= 99.5 F).   ASPIRIN 81 MG TABLET    Take 1 tablet (81 mg total) by mouth daily.   BETA CAROTENE W/MINERALS (OCUVITE) TABLET    Take 1 tablet by mouth daily. For eyes   CEFUROXIME (CEFTIN) 500 MG TABLET    Take 1 tablet (500 mg total) by mouth 2 (two) times daily with a meal.   FEEDING SUPPLEMENT, ENSURE COMPLETE, (ENSURE COMPLETE) LIQD    Take 237 mLs by mouth 2 (two) times daily between meals.   LORAZEPAM (ATIVAN) 0.5 MG TABLET    Take 1 tablet (0.5 mg total) by mouth every 8 (eight) hours.   OMEGA-3 FATTY ACIDS (FISH OIL) 1000 MG CAPS    Take by mouth. Take one capsule a day   POLYETHYLENE GLYCOL (MIRALAX / GLYCOLAX) PACKET    Take 17 g by mouth daily as needed.   TRIAMCINOLONE CREAM (  KENALOG) 0.1 %    Apply 1 application topically 2 (two) times daily.  Modified Medications   No medications on file  Discontinued Medications   No medications on file     Physical Exam: Physical Exam  Constitutional: She is oriented to person, place, and time. She appears well-developed and well-nourished. No distress.  HENT:  Head: Normocephalic and atraumatic.  Right Ear: External ear normal.  Left Ear: External ear normal.  Nose: Nose normal.  Mouth/Throat: Oropharynx is clear and moist. No oropharyngeal exudate.  Eyes: Conjunctivae and EOM are normal. Pupils are equal, round, and reactive to light. Right eye exhibits no discharge. Left eye exhibits no discharge. No scleral icterus.  Neck: Normal range of motion. Neck supple. No JVD present. No tracheal deviation present. No thyromegaly present.  Cardiovascular: Normal rate, regular rhythm, normal heart sounds and intact distal pulses.  Exam reveals no gallop and no friction rub.   No murmur  heard. Pulmonary/Chest: Effort normal and breath sounds normal. No stridor. No respiratory distress. She has no wheezes. She has no rales. She exhibits no tenderness.  Abdominal: Soft. Bowel sounds are normal. She exhibits no distension and no mass. There is no tenderness. There is no rebound and no guarding.  Musculoskeletal: Normal range of motion. She exhibits no edema and no tenderness.  Lymphadenopathy:    She has no cervical adenopathy.  Neurological: She is alert and oriented to person, place, and time. She has normal reflexes. She displays normal reflexes. No cranial nerve deficit. She exhibits normal muscle tone. Coordination normal.  Skin: No rash noted. She is not diaphoretic. There is erythema. No pallor.  Abrasion left knee and left great toe. Soft tissue injury the right lower abd and suprapubic area noted infected but developing into pressure ulcer. Peri-wound mild erythema  Psychiatric: She has a normal mood and affect. Her behavior is normal. Judgment and thought content normal. Cognition and memory are impaired. She exhibits abnormal recent memory. She exhibits normal remote memory.   (type .physexam) Filed Vitals:   11/10/13 1417  BP: 150/86  Pulse: 100  Temp: 98.1 F (36.7 C)  TempSrc: Tympanic  Resp: 20  SpO2: 92%      Labs reviewed: Basic Metabolic Panel:  Recent Labs  11/04/13 1814 11/05/13 0400 11/12/13  NA 130* 130* 144  K 3.6* 3.5* 4.2  CL 85* 91*  --   CO2 27 26  --   GLUCOSE 101* 93  --   BUN 31* 45* 22*  CREATININE 0.65 1.04 0.4*  CALCIUM 9.3 8.6  --   MG 2.0  --   --   TSH  --   --  2.24   Liver Function Tests:  Recent Labs  11/04/13 1814 11/05/13 0400 11/12/13  AST 185* 168* 40*  ALT 32 29 20  ALKPHOS 83 63 53  BILITOT 0.9 0.4  --   PROT 7.5 6.1  --   ALBUMIN 3.8 3.1*  --    CBC:  Recent Labs  11/04/13 1814 11/05/13 0400 11/12/13  WBC 6.6 6.6 7.3  NEUTROABS 5.6 5.3  --   HGB 17.8* 15.0 14.1  HCT 49.8* 42.7 41  MCV 92.7  93.4  --   PLT 159 164 226   Past Procedures:  11/04/2013 CLINICAL DATA: Fall, weakness EXAM: CHEST - 1 VIEW COMPARISON: IMPRESSION: Stable hyperinflation. No acute process   11/04/2013 CLINICAL DATA: Fall EXAM: CT HEAD WITHOUT CONTRAST CT MAXILLOFACIAL WITHOUT CONTRAST CT CERVICAL SPINE WITHOUT CONTRAST: IMPRESSION: Mild soft  tissue swelling overlying the right frontal bone/orbit. No evidence of acute intracranial abnormality. Atrophy with small vessel ischemic changes and intracranial atherosclerosis. No evidence of maxillofacial fracture. No evidence of traumatic injury to the cervical spine. Moderate multilevel degenerative changes.   11/05/13: EEG: IMPRESSION: This is a normal awake EEG   Assessment/Plan Abrasion Left great toe and left knee abrasion-presently cleansed with NS and apply ABT ointment.   Soft tissue injury to right hip and suprapubic area Presently treated with ABT ointment-developing into pressure ulcer-may consider Hydrogel and Alginate wet to moist dressing to debride. Fall with soft tissue injury to right hip and suprapubic area, head trauma / decubitus ulcer: Eschar noted above suprapubic area and right hip, likely from prolonged exposure from fall.   Unspecified hypothyroidism TSH 2.238 11/12/13, not taking thyroid supplement.   Unspecified constipation Prn MiraLax is adequate.   Generalized anxiety disorder Stable, able to change her Lorazepam to 0.5mg  q8hr prn from routine.   UTI (urinary tract infection) Preliminary cultures growing Escherichia coli. Discharge home on an additional 6 days of therapy with Ceftin. Urinary culture showed susceptible to cephalosporins    NSTEMI (non-ST elevated myocardial infarction) Patient was noted to have elevated troponins. She was given aspirin per rectum and placed on a heparin drip. EKG showed normal sinus rhythm at 96 beats per minute with right atrial enlargement and prolonged QTc interval but no ST changes. The  palliative care team was consulted given the patient's MOST form indicative of comfort measures only. Patient's condition was discussed with her guardian, and the plan is to discontinue heparin, lab draws, and further diagnostic testing. discontinued ASA.     Family/ Staff Communication: observe the patient.   Goals of Care: AL  Labs/tests ordered: CBC, CMP, TSH done 11/12/13

## 2013-11-12 LAB — BASIC METABOLIC PANEL
BUN: 22 mg/dL — AB (ref 4–21)
Creatinine: 0.4 mg/dL — AB (ref 0.5–1.1)
Glucose: 86 mg/dL
POTASSIUM: 4.2 mmol/L (ref 3.4–5.3)
SODIUM: 144 mmol/L (ref 137–147)

## 2013-11-12 LAB — CBC AND DIFFERENTIAL
HEMATOCRIT: 41 % (ref 36–46)
HEMOGLOBIN: 14.1 g/dL (ref 12.0–16.0)
Platelets: 226 10*3/uL (ref 150–399)
WBC: 7.3 10^3/mL

## 2013-11-12 LAB — HEPATIC FUNCTION PANEL
ALT: 20 U/L (ref 7–35)
AST: 40 U/L — AB (ref 13–35)
Alkaline Phosphatase: 53 U/L (ref 25–125)
Bilirubin, Total: 0.7 mg/dL

## 2013-11-12 LAB — TSH: TSH: 2.24 u[IU]/mL (ref 0.41–5.90)

## 2013-11-12 NOTE — Consult Note (Signed)
I have reviewed and discussed the care of this patient in detail with the nurse practitioner including pertinent patient records, physical exam findings and data. I agree with details of this encounter.  

## 2013-11-13 ENCOUNTER — Encounter: Payer: Self-pay | Admitting: Nurse Practitioner

## 2013-11-13 DIAGNOSIS — F411 Generalized anxiety disorder: Secondary | ICD-10-CM | POA: Insufficient documentation

## 2013-11-13 DIAGNOSIS — T148XXA Other injury of unspecified body region, initial encounter: Secondary | ICD-10-CM | POA: Insufficient documentation

## 2013-11-13 NOTE — Assessment & Plan Note (Signed)
Stable, able to change her Lorazepam to 0.5mg  q8hr prn from routine.

## 2013-11-13 NOTE — Assessment & Plan Note (Signed)
TSH 2.238 11/12/13, not taking thyroid supplement.    

## 2013-11-13 NOTE — Assessment & Plan Note (Signed)
Prn MiraLax is adequate.

## 2013-11-13 NOTE — Assessment & Plan Note (Addendum)
Presently treated with ABT ointment-developing into pressure ulcer-may consider Hydrogel and Alginate wet to moist dressing to debride. Fall with soft tissue injury to right hip and suprapubic area, head trauma / decubitus ulcer: Eschar noted above suprapubic area and right hip, likely from prolonged exposure from fall.

## 2013-11-13 NOTE — Assessment & Plan Note (Signed)
Left great toe and left knee abrasion-presently cleansed with NS and apply ABT ointment.

## 2013-11-14 ENCOUNTER — Encounter: Payer: Self-pay | Admitting: Nurse Practitioner

## 2013-11-14 NOTE — Assessment & Plan Note (Signed)
Patient was noted to have elevated troponins. She was given aspirin per rectum and placed on a heparin drip. EKG showed normal sinus rhythm at 96 beats per minute with right atrial enlargement and prolonged QTc interval but no ST changes. The palliative care team was consulted given the patient's MOST form indicative of comfort measures only. Patient's condition was discussed with her guardian, and the plan is to discontinue heparin, lab draws, and further diagnostic testing. discontinued ASA.

## 2013-11-14 NOTE — Assessment & Plan Note (Signed)
Preliminary cultures growing Escherichia coli. Discharge home on an additional 6 days of therapy with Ceftin. Urinary culture showed susceptible to cephalosporins

## 2013-11-16 ENCOUNTER — Other Ambulatory Visit: Payer: Self-pay | Admitting: *Deleted

## 2013-11-16 MED ORDER — POLYETHYLENE GLYCOL 3350 17 GM/SCOOP PO POWD: ORAL | Status: DC

## 2013-11-20 ENCOUNTER — Non-Acute Institutional Stay (SKILLED_NURSING_FACILITY): Payer: Medicare Other | Admitting: Nurse Practitioner

## 2013-11-20 ENCOUNTER — Encounter: Payer: Self-pay | Admitting: Nurse Practitioner

## 2013-11-20 DIAGNOSIS — R413 Other amnesia: Secondary | ICD-10-CM

## 2013-11-20 DIAGNOSIS — I214 Non-ST elevation (NSTEMI) myocardial infarction: Secondary | ICD-10-CM

## 2013-11-20 DIAGNOSIS — F0390 Unspecified dementia without behavioral disturbance: Secondary | ICD-10-CM | POA: Insufficient documentation

## 2013-11-20 DIAGNOSIS — R0902 Hypoxemia: Secondary | ICD-10-CM | POA: Insufficient documentation

## 2013-11-20 DIAGNOSIS — E039 Hypothyroidism, unspecified: Secondary | ICD-10-CM

## 2013-11-20 DIAGNOSIS — K5900 Constipation, unspecified: Secondary | ICD-10-CM

## 2013-11-20 DIAGNOSIS — T1490XA Injury, unspecified, initial encounter: Secondary | ICD-10-CM

## 2013-11-20 DIAGNOSIS — I10 Essential (primary) hypertension: Secondary | ICD-10-CM

## 2013-11-20 DIAGNOSIS — T148XXA Other injury of unspecified body region, initial encounter: Secondary | ICD-10-CM

## 2013-11-20 DIAGNOSIS — L8990 Pressure ulcer of unspecified site, unspecified stage: Secondary | ICD-10-CM

## 2013-11-20 NOTE — Assessment & Plan Note (Signed)
SLUMS 1/30 11/17/13--MOST indicated comfort measures only. SNF for care.  

## 2013-11-20 NOTE — Assessment & Plan Note (Signed)
Controlled.  

## 2013-11-20 NOTE — Assessment & Plan Note (Signed)
TSH 2.238 11/12/13, not taking thyroid supplement.    

## 2013-11-20 NOTE — Assessment & Plan Note (Signed)
Developed into a pressure ulcer in suprapubic area.

## 2013-11-20 NOTE — Progress Notes (Signed)
Patient ID: Carol [name], female   DOB: May 06, 1928, 80 y.o.   MRN: [id number]   Code Status: DNR  No Known Allergies  Chief Complaint  Patient presents with  . Medical Managment of Chronic Issues    congestive cough, O2 desaturation.   . Acute Visit    HPI: Patient is a 80 y.o. female seen in the SNF at [facility] today for evaluation of congestive cough, O2 desaturation, memory loss,  pressure ulcers, NSTEMI, and other chronic medical conditions.   Hospitalized from 11/04/2013 to 11/06/2013. She presented to ED after being found down and unresponsive at her independent living facility. It was estimated that she was potentially on the floor for 24 hours.  Her Acute encephalopathy: Neurological exam is nonfocal. MRI was subsequently found to not be necessary. EEG was within normal limits. Encephalopathy was felt to be from metabolic factors. Patient appears back to her baseline. Has palliative consult while in hospital: Irena Reichmann, NP, Palliative care-comfort measures.   Problem List Items Addressed This Visit   Decubitus ulcer - Primary     Suprapubic area-continue wet to dry dressing to debride eschar-monitor for s/s of infection.     Hypoxemia     O2 at 2lpm to maintain Sat O2>89%, CXR to evaluate further, Mucinex 600mg  bid for now. Also update her labs-CBC, CMP, UA C/S. MOST NO IVF and comfort measures only. May ABT    Memory deficit     SLUMS 1/30 11/17/13--MOST indicated comfort measures only. SNF for care.     NSTEMI (non-ST elevated myocardial infarction)     No angina since last visit.     Soft tissue injury to right hip and suprapubic area     Developed into a pressure ulcer in suprapubic area.     Unspecified constipation     Prn MiraLax is adequate.       Unspecified essential hypertension     Controlled.     Unspecified hypothyroidism     TSH 2.238 11/12/13, not taking thyroid supplement.          Review of Systems:  Review of Systems  Constitutional:  Positive for malaise/fatigue. Negative for fever, chills, weight loss and diaphoresis.  HENT: Positive for hearing loss. Negative for congestion, ear discharge, ear pain, nosebleeds, sore throat and tinnitus.   Eyes: Negative for blurred vision, double vision, photophobia, pain, discharge and redness.  Respiratory: Negative for cough, hemoptysis, sputum production, shortness of breath, wheezing and stridor.   Cardiovascular: Negative for chest pain, palpitations, orthopnea, claudication, leg swelling and PND.  Gastrointestinal: Negative for heartburn, nausea, vomiting, abdominal pain, diarrhea, constipation, blood in stool and melena.  Genitourinary: Negative for dysuria, urgency, frequency, hematuria and flank pain.  Musculoskeletal: Positive for falls and joint pain. Negative for back pain, myalgias and neck pain.  Skin: Negative for itching and rash.       Abrasion left knee and left great toe. Soft tissue injury the right lower abd and suprapubic area noted infected but developing into pressure ulcer.   Neurological: Negative for dizziness, tingling, tremors, sensory change, speech change, focal weakness, seizures, loss of consciousness, weakness and headaches.  Endo/Heme/Allergies: Negative for environmental allergies and polydipsia. Does not bruise/bleed easily.  Psychiatric/Behavioral: Positive for memory loss. Negative for depression, suicidal ideas, hallucinations and substance abuse. The patient is nervous/anxious. The patient does not have insomnia.      Past Medical History  Diagnosis Date  . AK (actinic keratosis)     right tip of nose  and right posterior leg  . Cancer 08/07/12    squamous cell ca,keratoacanthoma-right posterior leg  . Skin cancer 06/2011    scc right elbow tx included excision  . Radiation 09/10/2012-10/24/2012    30 fractions to right posterior calf  . Thyroid disease   . Anemia   . Hypertension   . Varicose vein of leg   . Hemorrhoids   . Spastic colon     . Insomnia, unspecified   . Unspecified urinary incontinence   . Hypoxemia   . Unspecified hypothyroidism   . Abnormality of gait   . Arthritis   . Osteoarthrosis, unspecified whether generalized or localized, unspecified site   . Radiation 09/10/12-10/24/12    Squamous cell right posterior calf 6000 cGy   Past Surgical History  Procedure Laterality Date  . Abdominal hysterectomy      early 40's, abdominal  . Appendectomy    . Anterior heel repair    . Total hip arthroplasty  07/31/2010    right  . Pheochromocytoma      left  . Fracture surgery  2003    left   . Wrist fracture surgery  2003    left Dr. Newman Nip   Social History:   reports that she quit smoking about 35 years ago. Her smoking use included Cigarettes. She has a 5 pack-year smoking history. She has never used smokeless tobacco. She reports that she drinks about 1.8 ounces of alcohol per week. She reports that she does not use illicit drugs.  Family History  Problem Relation Age of Onset  . Stroke Mother   . Stroke Father     Medications: Patient's Medications  New Prescriptions   No medications on file  Previous Medications   ACETAMINOPHEN (TYLENOL) 325 MG TABLET    Take 2 tablets (650 mg total) by mouth every 4 (four) hours as needed for mild pain (temperature >/= 99.5 F).   ASPIRIN 81 MG TABLET    Take 1 tablet (81 mg total) by mouth daily.   BETA CAROTENE W/MINERALS (OCUVITE) TABLET    Take 1 tablet by mouth daily. For eyes   FEEDING SUPPLEMENT, ENSURE COMPLETE, (ENSURE COMPLETE) LIQD    Take 237 mLs by mouth 2 (two) times daily between meals.   LORAZEPAM (ATIVAN) 0.5 MG TABLET    Take 1 tablet (0.5 mg total) by mouth every 8 (eight) hours.   OMEGA-3 FATTY ACIDS (FISH OIL) 1000 MG CAPS    Take by mouth. Take one capsule a day   POLYETHYLENE GLYCOL (MIRALAX / GLYCOLAX) PACKET    Take 17 g by mouth daily as needed.   POLYETHYLENE GLYCOL POWDER (MIRALAX) POWDER    Take 17grams by mouth daily as needed  for constipation   TRIAMCINOLONE CREAM (KENALOG) 0.1 %    Apply 1 application topically 2 (two) times daily.  Modified Medications   No medications on file  Discontinued Medications   CEFUROXIME (CEFTIN) 500 MG TABLET    Take 1 tablet (500 mg total) by mouth 2 (two) times daily with a meal.     Physical Exam: Physical Exam  Constitutional: She is oriented to person, place, and time. She appears well-developed and well-nourished. No distress.  HENT:  Head: Normocephalic and atraumatic.  Right Ear: External ear normal.  Left Ear: External ear normal.  Nose: Nose normal.  Mouth/Throat: Oropharynx is clear and moist. No oropharyngeal exudate.  Eyes: Conjunctivae and EOM are normal. Pupils are equal, round, and reactive to light. Right eye  exhibits no discharge. Left eye exhibits no discharge. No scleral icterus.  Neck: Normal range of motion. Neck supple. No JVD present. No tracheal deviation present. No thyromegaly present.  Cardiovascular: Normal rate, regular rhythm, normal heart sounds and intact distal pulses.  Exam reveals no gallop and no friction rub.   No murmur heard. Pulmonary/Chest: Effort normal and breath sounds normal. No stridor. No respiratory distress. She has no wheezes. She has no rales. She exhibits no tenderness.  Abdominal: Soft. Bowel sounds are normal. She exhibits no distension and no mass. There is no tenderness. There is no rebound and no guarding.  Musculoskeletal: Normal range of motion. She exhibits no edema and no tenderness.  Lymphadenopathy:    She has no cervical adenopathy.  Neurological: She is alert and oriented to person, place, and time. She has normal reflexes. No cranial nerve deficit. She exhibits normal muscle tone. Coordination normal.  Skin: No rash noted. She is not diaphoretic. There is erythema. No pallor.  Abrasion left knee and left great toe. Soft tissue injury the right lower abd and suprapubic area noted infected but developing into  pressure ulcer. Peri-wound mild erythema  Psychiatric: She has a normal mood and affect. Her behavior is normal. Judgment and thought content normal. Cognition and memory are impaired. She exhibits abnormal recent memory. She exhibits normal remote memory.   (type .physexam) Filed Vitals:   11/20/13 1406  BP: 130/90  Pulse: 199  Temp: 98 F (36.7 C)  TempSrc: Tympanic  Resp: 20      Labs reviewed: Basic Metabolic Panel:  Recent Labs  11/04/13 1814 11/05/13 0400 11/12/13  NA 130* 130* 144  K 3.6* 3.5* 4.2  CL 85* 91*  --   CO2 27 26  --   GLUCOSE 101* 93  --   BUN 31* 45* 22*  CREATININE 0.65 1.04 0.4*  CALCIUM 9.3 8.6  --   MG 2.0  --   --   TSH  --   --  2.24   Liver Function Tests:  Recent Labs  11/04/13 1814 11/05/13 0400 11/12/13  AST 185* 168* 40*  ALT 32 29 20  ALKPHOS 83 63 53  BILITOT 0.9 0.4  --   PROT 7.5 6.1  --   ALBUMIN 3.8 3.1*  --    CBC:  Recent Labs  11/04/13 1814 11/05/13 0400 11/12/13  WBC 6.6 6.6 7.3  NEUTROABS 5.6 5.3  --   HGB 17.8* 15.0 14.1  HCT 49.8* 42.7 41  MCV 92.7 93.4  --   PLT 159 164 226   Past Procedures:  11/04/2013 CLINICAL DATA: Fall, weakness EXAM: CHEST - 1 VIEW COMPARISON: IMPRESSION: Stable hyperinflation. No acute process   11/04/2013 CLINICAL DATA: Fall EXAM: CT HEAD WITHOUT CONTRAST CT MAXILLOFACIAL WITHOUT CONTRAST CT CERVICAL SPINE WITHOUT CONTRAST: IMPRESSION: Mild soft tissue swelling overlying the right frontal bone/orbit. No evidence of acute intracranial abnormality. Atrophy with small vessel ischemic changes and intracranial atherosclerosis. No evidence of maxillofacial fracture. No evidence of traumatic injury to the cervical spine. Moderate multilevel degenerative changes.   11/05/13: EEG: IMPRESSION: This is a normal awake EEG   Assessment/Plan Decubitus ulcer Suprapubic area-continue wet to dry dressing to debride eschar-monitor for s/s of infection.   NSTEMI (non-ST elevated myocardial  infarction) No angina since last visit.   Soft tissue injury to right hip and suprapubic area Developed into a pressure ulcer in suprapubic area.   Unspecified constipation Prn MiraLax is adequate.     Unspecified essential  hypertension Controlled.   Unspecified hypothyroidism TSH 2.238 11/12/13, not taking thyroid supplement.     Hypoxemia O2 at 2lpm to maintain Sat O2>89%, CXR to evaluate further, Mucinex 600mg  bid for now. Also update her labs-CBC, CMP, UA C/S. MOST NO IVF and comfort measures only. May ABT  Memory deficit SLUMS 1/30 11/17/13--MOST indicated comfort measures only. SNF for care.     Family/ Staff Communication: observe the patient.   Goals of Care: SNF  Labs/tests ordered: CBC, CMP, TSH done 11/12/13. CXR

## 2013-11-20 NOTE — Assessment & Plan Note (Signed)
Prn MiraLax is adequate.  

## 2013-11-20 NOTE — Assessment & Plan Note (Signed)
No angina since last visit.

## 2013-11-20 NOTE — Assessment & Plan Note (Addendum)
O2 at 2lpm to maintain Sat O2>89%, CXR to evaluate further, Mucinex 600mg  bid for now. Also update her labs-CBC, CMP, UA C/S. MOST NO IVF and comfort measures only. May ABT

## 2013-11-20 NOTE — Assessment & Plan Note (Signed)
Suprapubic area-continue wet to dry dressing to debride eschar-monitor for s/s of infection.

## 2013-11-21 LAB — CBC AND DIFFERENTIAL
HCT: 36 % (ref 36–46)
Hemoglobin: 12.2 g/dL (ref 12.0–16.0)
PLATELETS: 236 10*3/uL (ref 150–399)
WBC: 7.7 10^3/mL

## 2013-11-21 LAB — BASIC METABOLIC PANEL
BUN: 19 mg/dL (ref 4–21)
Creatinine: 0.6 mg/dL (ref 0.5–1.1)
GLUCOSE: 105 mg/dL
Potassium: 4.6 mmol/L (ref 3.4–5.3)
Sodium: 142 mmol/L (ref 137–147)

## 2013-11-21 LAB — HEPATIC FUNCTION PANEL
ALT: 10 U/L (ref 7–35)
AST: 19 U/L (ref 13–35)
Alkaline Phosphatase: 74 U/L (ref 25–125)
BILIRUBIN, TOTAL: 0.8 mg/dL

## 2013-11-24 ENCOUNTER — Encounter: Payer: Self-pay | Admitting: Nurse Practitioner

## 2013-11-24 ENCOUNTER — Non-Acute Institutional Stay (SKILLED_NURSING_FACILITY): Payer: Medicare Other | Admitting: Nurse Practitioner

## 2013-11-24 DIAGNOSIS — R0902 Hypoxemia: Secondary | ICD-10-CM

## 2013-11-24 DIAGNOSIS — E039 Hypothyroidism, unspecified: Secondary | ICD-10-CM

## 2013-11-24 DIAGNOSIS — J209 Acute bronchitis, unspecified: Secondary | ICD-10-CM

## 2013-11-24 DIAGNOSIS — K5900 Constipation, unspecified: Secondary | ICD-10-CM

## 2013-11-24 DIAGNOSIS — F411 Generalized anxiety disorder: Secondary | ICD-10-CM

## 2013-11-24 DIAGNOSIS — I8390 Asymptomatic varicose veins of unspecified lower extremity: Secondary | ICD-10-CM

## 2013-11-24 DIAGNOSIS — R413 Other amnesia: Secondary | ICD-10-CM

## 2013-11-24 DIAGNOSIS — G4700 Insomnia, unspecified: Secondary | ICD-10-CM

## 2013-11-24 NOTE — Progress Notes (Signed)
Patient ID: Carol [name], female   DOB: January 09, 1928, 80 y.o.   MRN: [id number]   Code Status: DNR  No Known Allergies  Chief Complaint  Patient presents with  . Medical Managment of Chronic Issues    poor appetite, not sleep well at night, edema  . Acute Visit  . Dementia    HPI: Patient is a 80 y.o. female seen in the SNF at [facility] today for evaluation of edema, not sleep well at night, poor appetite, , O2 desaturation, memory loss,  pressure ulcers, NSTEMI, and other chronic medical conditions.   Hospitalized from 11/04/2013 to 11/06/2013. She presented to ED after being found down and unresponsive at her independent living facility. It was estimated that she was potentially on the floor for 24 hours.  Her Acute encephalopathy: Neurological exam is nonfocal. MRI was subsequently found to not be necessary. EEG was within normal limits. Encephalopathy was felt to be from metabolic factors. Patient appears back to her baseline. Has palliative consult while in hospital: Irena Reichmann, NP, Palliative care-comfort measures.   Problem List Items Addressed This Visit   Varicose veins of lower extremity     Chronic BLE edema, 1+ today, monitor weight    Unspecified hypothyroidism - Primary     TSH 2.238 11/12/13, not taking thyroid supplement.       Unspecified constipation     Prn MiraLax is adequate.     Memory deficit     SLUMS 1/30 11/17/13--MOST indicated comfort measures only. SNF for care.      Insomnia, unspecified     Mirtazapine 7.5mg  qhs may help-observe.     Hypoxemia     Supplemented with O2     Generalized anxiety disorder     Poor appetite, no sleep/rest well at night, Lorazepam to 0.5mg  q8hr prn is not adequate, will try Mirtazapine 7.5mg  qhs     Acute bronchitis     Cough, O2 desaturation-will complete total 10 day course of Avelox started 11/20/13-observe the patient.        Review of Systems:  Review of Systems  Constitutional: Positive for  malaise/fatigue. Negative for fever, chills, weight loss and diaphoresis.  HENT: Positive for hearing loss. Negative for congestion, ear discharge, ear pain, nosebleeds, sore throat and tinnitus.   Eyes: Negative for blurred vision, double vision, photophobia, pain, discharge and redness.  Respiratory: Positive for cough. Negative for hemoptysis, sputum production, shortness of breath, wheezing and stridor.   Cardiovascular: Positive for leg swelling. Negative for chest pain, palpitations, orthopnea, claudication and PND.  Gastrointestinal: Negative for heartburn, nausea, vomiting, abdominal pain, diarrhea, constipation, blood in stool and melena.  Genitourinary: Negative for dysuria, urgency, frequency, hematuria and flank pain.  Musculoskeletal: Positive for falls and joint pain. Negative for back pain, myalgias and neck pain.  Skin: Negative for itching and rash.       Abrasion left knee and left great toe. Soft tissue injury the right lower abd and suprapubic area noted infected but developing into pressure ulcer.   Neurological: Negative for dizziness, tingling, tremors, sensory change, speech change, focal weakness, seizures, loss of consciousness, weakness and headaches.  Endo/Heme/Allergies: Negative for environmental allergies and polydipsia. Does not bruise/bleed easily.  Psychiatric/Behavioral: Positive for memory loss. Negative for depression, suicidal ideas, hallucinations and substance abuse. The patient is nervous/anxious and has insomnia.      Past Medical History  Diagnosis Date  . AK (actinic keratosis)     right tip of nose and right posterior leg  .  Cancer 08/07/12    squamous cell ca,keratoacanthoma-right posterior leg  . Skin cancer 06/2011    scc right elbow tx included excision  . Radiation 09/10/2012-10/24/2012    30 fractions to right posterior calf  . Thyroid disease   . Anemia   . Hypertension   . Varicose vein of leg   . Hemorrhoids   . Spastic colon   .  Insomnia, unspecified   . Unspecified urinary incontinence   . Hypoxemia   . Unspecified hypothyroidism   . Abnormality of gait   . Arthritis   . Osteoarthrosis, unspecified whether generalized or localized, unspecified site   . Radiation 09/10/12-10/24/12    Squamous cell right posterior calf 6000 cGy   Past Surgical History  Procedure Laterality Date  . Abdominal hysterectomy      early 40's, abdominal  . Appendectomy    . Anterior heel repair    . Total hip arthroplasty  07/31/2010    right  . Pheochromocytoma      left  . Fracture surgery  2003    left   . Wrist fracture surgery  2003    left Dr. Mikael Spray   Social History:   reports that she quit smoking about 35 years ago. Her smoking use included Cigarettes. She has a 5 pack-year smoking history. She has never used smokeless tobacco. She reports that she drinks about 1.8 ounces of alcohol per week. She reports that she does not use illicit drugs.  Family History  Problem Relation Age of Onset  . Stroke Mother   . Stroke Father     Medications: Patient's Medications  New Prescriptions   No medications on file  Previous Medications   ACETAMINOPHEN (TYLENOL) 325 MG TABLET    Take 2 tablets (650 mg total) by mouth every 4 (four) hours as needed for mild pain (temperature >/= 99.5 F).   ASPIRIN 81 MG TABLET    Take 1 tablet (81 mg total) by mouth daily.   BETA CAROTENE W/MINERALS (OCUVITE) TABLET    Take 1 tablet by mouth daily. For eyes   FEEDING SUPPLEMENT, ENSURE COMPLETE, (ENSURE COMPLETE) LIQD    Take 237 mLs by mouth 2 (two) times daily between meals.   LORAZEPAM (ATIVAN) 0.5 MG TABLET    Take 1 tablet (0.5 mg total) by mouth every 8 (eight) hours.   OMEGA-3 FATTY ACIDS (FISH OIL) 1000 MG CAPS    Take by mouth. Take one capsule a day   POLYETHYLENE GLYCOL (MIRALAX / GLYCOLAX) PACKET    Take 17 g by mouth daily as needed.   POLYETHYLENE GLYCOL POWDER (MIRALAX) POWDER    Take 17grams by mouth daily as needed for  constipation   TRIAMCINOLONE CREAM (KENALOG) 0.1 %    Apply 1 application topically 2 (two) times daily.  Modified Medications   No medications on file  Discontinued Medications   No medications on file     Physical Exam: Physical Exam  Constitutional: She is oriented to person, place, and time. She appears well-developed and well-nourished. No distress.  HENT:  Head: Normocephalic and atraumatic.  Right Ear: External ear normal.  Left Ear: External ear normal.  Nose: Nose normal.  Mouth/Throat: Oropharynx is clear and moist. No oropharyngeal exudate.  Eyes: Conjunctivae and EOM are normal. Pupils are equal, round, and reactive to light. Right eye exhibits no discharge. Left eye exhibits no discharge. No scleral icterus.  Neck: Normal range of motion. Neck supple. No JVD present. No tracheal deviation present.  No thyromegaly present.  Cardiovascular: Normal rate, regular rhythm, normal heart sounds and intact distal pulses.  Exam reveals no gallop and no friction rub.   No murmur heard. Pulmonary/Chest: Effort normal and breath sounds normal. No stridor. No respiratory distress. She has no wheezes. She has no rales. She exhibits no tenderness.  Abdominal: Soft. Bowel sounds are normal. She exhibits no distension and no mass. There is no tenderness. There is no rebound and no guarding.  Musculoskeletal: Normal range of motion. She exhibits edema. She exhibits no tenderness.  1+ edema BLE  Lymphadenopathy:    She has no cervical adenopathy.  Neurological: She is alert and oriented to person, place, and time. She has normal reflexes. No cranial nerve deficit. She exhibits normal muscle tone. Coordination normal.  Skin: No rash noted. She is not diaphoretic. There is erythema. No pallor.  Abrasion left knee and left great toe. Soft tissue injury the right lower abd and suprapubic area noted infected but developing into pressure ulcer. Peri-wound mild erythema  Psychiatric: She has a normal  mood and affect. Her behavior is normal. Judgment and thought content normal. Cognition and memory are impaired. She exhibits abnormal recent memory. She exhibits normal remote memory.   (type .physexam) Filed Vitals:   11/24/13 1637  BP: 143/78  Pulse: 70  Temp: 97 F (36.1 C)  TempSrc: Tympanic  Resp: 16  SpO2: 92%      Labs reviewed: Basic Metabolic Panel:  Recent Labs  11/04/13 1814 11/05/13 0400 11/12/13 11/21/13  NA 130* 130* 144 142  K 3.6* 3.5* 4.2 4.6  CL 85* 91*  --   --   CO2 27 26  --   --   GLUCOSE 101* 93  --   --   BUN 31* 45* 22* 19  CREATININE 0.65 1.04 0.4* 0.6  CALCIUM 9.3 8.6  --   --   MG 2.0  --   --   --   TSH  --   --  2.24  --    Liver Function Tests:  Recent Labs  11/04/13 1814 11/05/13 0400 11/12/13 11/21/13  AST 185* 168* 40* 19  ALT 32 29 20 10   ALKPHOS 83 63 53 74  BILITOT 0.9 0.4  --   --   PROT 7.5 6.1  --   --   ALBUMIN 3.8 3.1*  --   --    CBC:  Recent Labs  11/04/13 1814 11/05/13 0400 11/12/13 11/21/13  WBC 6.6 6.6 7.3 7.7  NEUTROABS 5.6 5.3  --   --   HGB 17.8* 15.0 14.1 12.2  HCT 49.8* 42.7 41 36  MCV 92.7 93.4  --   --   PLT 159 164 226 236   Past Procedures:  11/04/2013 CLINICAL DATA: Fall, weakness EXAM: CHEST - 1 VIEW COMPARISON: IMPRESSION: Stable hyperinflation. No acute process   11/04/2013 CLINICAL DATA: Fall EXAM: CT HEAD WITHOUT CONTRAST CT MAXILLOFACIAL WITHOUT CONTRAST CT CERVICAL SPINE WITHOUT CONTRAST: IMPRESSION: Mild soft tissue swelling overlying the right frontal bone/orbit. No evidence of acute intracranial abnormality. Atrophy with small vessel ischemic changes and intracranial atherosclerosis. No evidence of maxillofacial fracture. No evidence of traumatic injury to the cervical spine. Moderate multilevel degenerative changes.   11/05/13: EEG: IMPRESSION: This is a normal awake EEG  11/15/13 CXR no radiographic evidence of acute pulmonary disease.  Assessment/Plan Unspecified hypothyroidism TSH  2.238 11/12/13, not taking thyroid supplement.     Generalized anxiety disorder Poor appetite, no sleep/rest well at night,  Lorazepam to 0.5mg  q8hr prn is not adequate, will try Mirtazapine 7.5mg  qhs   Hypoxemia Supplemented with O2   Insomnia, unspecified Mirtazapine 7.5mg  qhs may help-observe.   Memory deficit SLUMS 1/30 11/17/13--MOST indicated comfort measures only. SNF for care.    Unspecified constipation Prn MiraLax is adequate.   Varicose veins of lower extremity Chronic BLE edema, 1+ today, monitor weight  Acute bronchitis Cough, O2 desaturation-will complete total 10 day course of Avelox started 11/20/13-observe the patient.     Family/ Staff Communication: observe the patient.   Goals of Care: SNF  Labs/tests ordered: none.

## 2013-11-26 ENCOUNTER — Encounter: Payer: Self-pay | Admitting: Internal Medicine

## 2013-11-26 DIAGNOSIS — J209 Acute bronchitis, unspecified: Secondary | ICD-10-CM | POA: Insufficient documentation

## 2013-11-26 NOTE — Assessment & Plan Note (Signed)
Mirtazapine 7.5mg  qhs may help-observe.

## 2013-11-26 NOTE — Assessment & Plan Note (Signed)
Supplemented with O2

## 2013-11-26 NOTE — Assessment & Plan Note (Signed)
Chronic BLE edema, 1+ today, monitor weight

## 2013-11-26 NOTE — Assessment & Plan Note (Signed)
TSH 2.238 11/12/13, not taking thyroid supplement.    

## 2013-11-26 NOTE — Assessment & Plan Note (Signed)
Poor appetite, no sleep/rest well at night, Lorazepam to 0.5mg  q8hr prn is not adequate, will try Mirtazapine 7.5mg  qhs

## 2013-11-26 NOTE — Assessment & Plan Note (Signed)
Prn MiraLax is adequate.

## 2013-11-26 NOTE — Assessment & Plan Note (Signed)
Cough, O2 desaturation-will complete total 10 day course of Avelox started 11/20/13-observe the patient.

## 2013-11-26 NOTE — Assessment & Plan Note (Signed)
SLUMS 1/30 11/17/13--MOST indicated comfort measures only. SNF for care.  

## 2013-12-08 ENCOUNTER — Non-Acute Institutional Stay (SKILLED_NURSING_FACILITY): Payer: Medicare Other | Admitting: Nurse Practitioner

## 2013-12-08 ENCOUNTER — Encounter: Payer: Self-pay | Admitting: Nurse Practitioner

## 2013-12-08 DIAGNOSIS — R269 Unspecified abnormalities of gait and mobility: Secondary | ICD-10-CM

## 2013-12-08 DIAGNOSIS — S60222A Contusion of left hand, initial encounter: Secondary | ICD-10-CM

## 2013-12-08 DIAGNOSIS — R413 Other amnesia: Secondary | ICD-10-CM

## 2013-12-08 DIAGNOSIS — S52529A Torus fracture of lower end of unspecified radius, initial encounter for closed fracture: Secondary | ICD-10-CM

## 2013-12-08 DIAGNOSIS — S60221A Contusion of right hand, initial encounter: Secondary | ICD-10-CM | POA: Insufficient documentation

## 2013-12-08 DIAGNOSIS — S60229A Contusion of unspecified hand, initial encounter: Secondary | ICD-10-CM

## 2013-12-08 DIAGNOSIS — G4700 Insomnia, unspecified: Secondary | ICD-10-CM

## 2013-12-08 DIAGNOSIS — F411 Generalized anxiety disorder: Secondary | ICD-10-CM

## 2013-12-08 DIAGNOSIS — R0902 Hypoxemia: Secondary | ICD-10-CM

## 2013-12-08 DIAGNOSIS — I214 Non-ST elevation (NSTEMI) myocardial infarction: Secondary | ICD-10-CM

## 2013-12-08 HISTORY — DX: Torus fracture of lower end of unspecified radius, initial encounter for closed fracture: S52.529A

## 2013-12-08 NOTE — Assessment & Plan Note (Signed)
Poor appetite, no sleep/rest well at night, Lorazepam to 0.5mg  q8hr prn is not adequate-better since Mirtazapine 7.5mg  qhs

## 2013-12-08 NOTE — Assessment & Plan Note (Signed)
No angina since last visit. Continue ASA 81mg .

## 2013-12-08 NOTE — Progress Notes (Signed)
Patient ID: Carol [name], female   DOB: [birth year], 80 y.o.   MRN: [id number]   Code Status:  DNR MOST  No Known Allergies  Chief Complaint  Patient presents with  . Medical Managment of Chronic Issues    the right hand/wrist contusion/pain/swelling/heat, s/p fall.   . Acute Visit    HPI: Patient is a 80 y.o. female seen in the SNF at [facility] today for evaluation of the right hand/wrist pain, bruise, swelling, heat, BLE edema, not sleep well at night, poor appetite, , O2 desaturation, memory loss,  pressure ulcers, NSTEMI, and other chronic medical conditions.   Hospitalized from 11/04/2013 to 11/06/2013. She presented to ED after being found down and unresponsive at her independent living facility. It was estimated that she was potentially on the floor for 24 hours.  Her Acute encephalopathy: Neurological exam is nonfocal. MRI was subsequently found to not be necessary. EEG was within normal limits. Encephalopathy was felt to be from metabolic factors. Patient appears back to her baseline. Has palliative consult while in hospital: Irena Reichmann, NP, Palliative care-comfort measures.   Problem List Items Addressed This Visit   NSTEMI (non-ST elevated myocardial infarction)     No angina since last visit. Continue ASA 81mg .     Memory deficit     SLUMS 1/30 11/17/13--MOST indicated comfort measures only. SNF for care.       Insomnia, unspecified     Sleeps better with Mirtazapine 7.5mg     Hypoxemia     Continuous O2 via [place]    Generalized anxiety disorder     Poor appetite, no sleep/rest well at night, Lorazepam to 0.5mg  q8hr prn is not adequate-better since Mirtazapine 7.5mg  qhs      Contusion of right hand - Primary     Sustained from a fall, the dorsum of the right hand bruised, the entire right hand and wrist warmth, tenderness, swelling, and ROM with pain-will obtain X-ray of the right hand and wrist to evaluate further-MOST indicated comfort measures only-may consider  splint and pain management if fractured.     Abnormality of gait     Frequent falling due to lack of safety awareness and increased frailty. Intensive supervision needed.        Review of Systems:  Review of Systems  Constitutional: Positive for malaise/fatigue. Negative for fever, chills, weight loss and diaphoresis.  HENT: Positive for hearing loss. Negative for congestion, ear discharge, ear pain, nosebleeds, sore throat and tinnitus.   Eyes: Negative for blurred vision, double vision, photophobia, pain, discharge and redness.  Respiratory: Positive for cough. Negative for hemoptysis, sputum production, shortness of breath, wheezing and stridor.   Cardiovascular: Positive for leg swelling. Negative for chest pain, palpitations, orthopnea, claudication and PND.  Gastrointestinal: Negative for heartburn, nausea, vomiting, abdominal pain, diarrhea, constipation, blood in stool and melena.  Genitourinary: Negative for dysuria, urgency, frequency, hematuria and flank pain.  Musculoskeletal: Positive for falls and joint pain. Negative for back pain, myalgias and neck pain.  Skin: Negative for itching and rash.       Abrasion left knee and left great toe. Soft tissue injury the right lower abd and suprapubic area noted infected but developing into pressure ulcer.   Neurological: Negative for dizziness, tingling, tremors, sensory change, speech change, focal weakness, seizures, loss of consciousness, weakness and headaches.  Endo/Heme/Allergies: Negative for environmental allergies and polydipsia. Does not bruise/bleed easily.  Psychiatric/Behavioral: Positive for memory loss. Negative for depression, suicidal ideas, hallucinations and substance abuse. The  patient is nervous/anxious and has insomnia.      Past Medical History  Diagnosis Date  . AK (actinic keratosis)     right tip of nose and right posterior leg  . Cancer 08/07/12    squamous cell ca,keratoacanthoma-right posterior leg  .  Skin cancer 06/2011    scc right elbow tx included excision  . Radiation 09/10/2012-10/24/2012    30 fractions to right posterior calf  . Thyroid disease   . Anemia   . Hypertension   . Varicose vein of leg   . Hemorrhoids   . Spastic colon   . Insomnia, unspecified   . Unspecified urinary incontinence   . Hypoxemia   . Unspecified hypothyroidism   . Abnormality of gait   . Arthritis   . Osteoarthrosis, unspecified whether generalized or localized, unspecified site   . Radiation 09/10/12-10/24/12    Squamous cell right posterior calf 6000 cGy   Past Surgical History  Procedure Laterality Date  . Abdominal hysterectomy      early 40's, abdominal  . Appendectomy    . Anterior heel repair    . Total hip arthroplasty  07/31/2010    right  . Pheochromocytoma      left  . Fracture surgery  2003    left   . Wrist fracture surgery  2003    left Dr. Newman Nip   Social History:   reports that she quit smoking about 35 years ago. Her smoking use included Cigarettes. She has a 5 pack-year smoking history. She has never used smokeless tobacco. She reports that she drinks about 1.8 ounces of alcohol per week. She reports that she does not use illicit drugs.  Family History  Problem Relation Age of Onset  . Stroke Mother   . Stroke Father     Medications: Patient's Medications  New Prescriptions   No medications on file  Previous Medications   ACETAMINOPHEN (TYLENOL) 325 MG TABLET    Take 2 tablets (650 mg total) by mouth every 4 (four) hours as needed for mild pain (temperature >/= 99.5 F).   ASPIRIN 81 MG TABLET    Take 1 tablet (81 mg total) by mouth daily.   BETA CAROTENE W/MINERALS (OCUVITE) TABLET    Take 1 tablet by mouth daily. For eyes   FEEDING SUPPLEMENT, ENSURE COMPLETE, (ENSURE COMPLETE) LIQD    Take 237 mLs by mouth 2 (two) times daily between meals.   LORAZEPAM (ATIVAN) 0.5 MG TABLET    Take 1 tablet (0.5 mg total) by mouth every 8 (eight) hours.   MIRTAZAPINE  (REMERON) 7.5 MG TABLET    Take 7.5 mg by mouth at bedtime.   OMEGA-3 FATTY ACIDS (FISH OIL) 1000 MG CAPS    Take by mouth. Take one capsule a day   POLYETHYLENE GLYCOL (MIRALAX / GLYCOLAX) PACKET    Take 17 g by mouth daily as needed.   TRIAMCINOLONE CREAM (KENALOG) 0.1 %    Apply 1 application topically 2 (two) times daily.  Modified Medications   No medications on file  Discontinued Medications   POLYETHYLENE GLYCOL POWDER (MIRALAX) POWDER    Take 17grams by mouth daily as needed for constipation     Physical Exam: Physical Exam  Constitutional: She is oriented to person, place, and time. She appears well-developed and well-nourished. No distress.  HENT:  Head: Normocephalic and atraumatic.  Right Ear: External ear normal.  Left Ear: External ear normal.  Nose: Nose normal.  Mouth/Throat: Oropharynx is clear and  moist. No oropharyngeal exudate.  Eyes: Conjunctivae and EOM are normal. Pupils are equal, round, and reactive to light. Right eye exhibits no discharge. Left eye exhibits no discharge. No scleral icterus.  Neck: Normal range of motion. Neck supple. No JVD present. No tracheal deviation present. No thyromegaly present.  Cardiovascular: Normal rate, regular rhythm, normal heart sounds and intact distal pulses.  Exam reveals no gallop and no friction rub.   No murmur heard. Pulmonary/Chest: Effort normal and breath sounds normal. No stridor. No respiratory distress. She has no wheezes. She has no rales. She exhibits no tenderness.  Abdominal: Soft. Bowel sounds are normal. She exhibits no distension and no mass. There is no tenderness. There is no rebound and no guarding.  Musculoskeletal: Normal range of motion. She exhibits edema. She exhibits no tenderness.  1+ edema BLE  Lymphadenopathy:    She has no cervical adenopathy.  Neurological: She is alert and oriented to person, place, and time. She has normal reflexes. No cranial nerve deficit. She exhibits normal muscle tone.  Coordination normal.  Skin: No rash noted. She is not diaphoretic. There is erythema. No pallor.  Abrasion left knee and left great toe. Soft tissue injury the right lower abd and suprapubic area noted infected but developing into pressure ulcer. Peri-wound mild erythema  Psychiatric: She has a normal mood and affect. Her behavior is normal. Judgment and thought content normal. Cognition and memory are impaired. She exhibits abnormal recent memory. She exhibits normal remote memory.   (type .physexam) Filed Vitals:   12/08/13 1359  BP: 148/80  Pulse: 86  Temp: 98 F (36.7 C)  TempSrc: Tympanic  Resp: 20      Labs reviewed: Basic Metabolic Panel:  Recent Labs  11/04/13 1814 11/05/13 0400 11/12/13 11/21/13  NA 130* 130* 144 142  K 3.6* 3.5* 4.2 4.6  CL 85* 91*  --   --   CO2 27 26  --   --   GLUCOSE 101* 93  --   --   BUN 31* 45* 22* 19  CREATININE 0.65 1.04 0.4* 0.6  CALCIUM 9.3 8.6  --   --   MG 2.0  --   --   --   TSH  --   --  2.24  --    Liver Function Tests:  Recent Labs  11/04/13 1814 11/05/13 0400 11/12/13 11/21/13  AST 185* 168* 40* 19  ALT 32 29 20 10   ALKPHOS 83 63 53 74  BILITOT 0.9 0.4  --   --   PROT 7.5 6.1  --   --   ALBUMIN 3.8 3.1*  --   --    CBC:  Recent Labs  11/04/13 1814 11/05/13 0400 11/12/13 11/21/13  WBC 6.6 6.6 7.3 7.7  NEUTROABS 5.6 5.3  --   --   HGB 17.8* 15.0 14.1 12.2  HCT 49.8* 42.7 41 36  MCV 92.7 93.4  --   --   PLT 159 164 226 236   Past Procedures:  11/04/2013 CLINICAL DATA: Fall, weakness EXAM: CHEST - 1 VIEW COMPARISON: IMPRESSION: Stable hyperinflation. No acute process   11/04/2013 CLINICAL DATA: Fall EXAM: CT HEAD WITHOUT CONTRAST CT MAXILLOFACIAL WITHOUT CONTRAST CT CERVICAL SPINE WITHOUT CONTRAST: IMPRESSION: Mild soft tissue swelling overlying the right frontal bone/orbit. No evidence of acute intracranial abnormality. Atrophy with small vessel ischemic changes and intracranial atherosclerosis. No evidence of  maxillofacial fracture. No evidence of traumatic injury to the cervical spine. Moderate multilevel degenerative changes.  11/05/13: EEG: IMPRESSION: This is a normal awake EEG  11/15/13 CXR no radiographic evidence of acute pulmonary disease.  Assessment/Plan Contusion of right hand Sustained from a fall, the dorsum of the right hand bruised, the entire right hand and wrist warmth, tenderness, swelling, and ROM with pain-will obtain X-ray of the right hand and wrist to evaluate further-MOST indicated comfort measures only-may consider splint and pain management if fractured.   Generalized anxiety disorder Poor appetite, no sleep/rest well at night, Lorazepam to 0.5mg  q8hr prn is not adequate-better since Mirtazapine 7.5mg  qhs    Abnormality of gait Frequent falling due to lack of safety awareness and increased frailty. Intensive supervision needed.   Hypoxemia Continuous O2 via   Insomnia, unspecified Sleeps better with Mirtazapine 7.5mg   Memory deficit SLUMS 1/30 11/17/13--MOST indicated comfort measures only. SNF for care.     NSTEMI (non-ST elevated myocardial infarction) No angina since last visit. Continue ASA 81mg .     Family/ Staff Communication: observe the patient.   Goals of Care: SNF  Labs/tests ordered: X-ray left hand/wrist AP, lateral, oblique views.

## 2013-12-08 NOTE — Assessment & Plan Note (Signed)
Frequent falling due to lack of safety awareness and increased frailty. Intensive supervision needed.

## 2013-12-08 NOTE — Assessment & Plan Note (Signed)
Sustained from a fall, the dorsum of the right hand bruised, the entire right hand and wrist warmth, tenderness, swelling, and ROM with pain-will obtain X-ray of the right hand and wrist to evaluate further-MOST indicated comfort measures only-may consider splint and pain management if fractured.

## 2013-12-08 NOTE — Assessment & Plan Note (Signed)
Continuous O2 via [place]

## 2013-12-08 NOTE — Assessment & Plan Note (Signed)
SLUMS 1/30 11/17/13--MOST indicated comfort measures only. SNF for care.  

## 2013-12-08 NOTE — Assessment & Plan Note (Signed)
Sleeps better with Mirtazapine 7.5mg 

## 2013-12-09 LAB — CBC AND DIFFERENTIAL
HEMATOCRIT: 33 % — AB (ref 36–46)
Hemoglobin: 11 g/dL — AB (ref 12.0–16.0)
PLATELETS: 267 10*3/uL (ref 150–399)
WBC: 7.7 10^3/mL

## 2013-12-09 LAB — BASIC METABOLIC PANEL
BUN: 23 mg/dL — AB (ref 4–21)
CREATININE: 0.6 mg/dL (ref 0.5–1.1)
Glucose: 118 mg/dL
Potassium: 4 mmol/L (ref 3.4–5.3)
Sodium: 141 mmol/L (ref 137–147)

## 2013-12-11 ENCOUNTER — Non-Acute Institutional Stay (SKILLED_NURSING_FACILITY): Payer: Medicare Other | Admitting: Nurse Practitioner

## 2013-12-11 ENCOUNTER — Encounter: Payer: Self-pay | Admitting: Nurse Practitioner

## 2013-12-11 DIAGNOSIS — I10 Essential (primary) hypertension: Secondary | ICD-10-CM

## 2013-12-11 DIAGNOSIS — T1490XA Injury, unspecified, initial encounter: Secondary | ICD-10-CM

## 2013-12-11 DIAGNOSIS — R269 Unspecified abnormalities of gait and mobility: Secondary | ICD-10-CM

## 2013-12-11 DIAGNOSIS — K5900 Constipation, unspecified: Secondary | ICD-10-CM

## 2013-12-11 DIAGNOSIS — S52529A Torus fracture of lower end of unspecified radius, initial encounter for closed fracture: Secondary | ICD-10-CM | POA: Insufficient documentation

## 2013-12-11 DIAGNOSIS — N390 Urinary tract infection, site not specified: Secondary | ICD-10-CM

## 2013-12-11 DIAGNOSIS — T148XXA Other injury of unspecified body region, initial encounter: Secondary | ICD-10-CM

## 2013-12-11 DIAGNOSIS — IMO0002 Reserved for concepts with insufficient information to code with codable children: Secondary | ICD-10-CM

## 2013-12-11 DIAGNOSIS — D638 Anemia in other chronic diseases classified elsewhere: Secondary | ICD-10-CM | POA: Insufficient documentation

## 2013-12-11 DIAGNOSIS — G4700 Insomnia, unspecified: Secondary | ICD-10-CM

## 2013-12-11 DIAGNOSIS — L8990 Pressure ulcer of unspecified site, unspecified stage: Secondary | ICD-10-CM

## 2013-12-11 DIAGNOSIS — F411 Generalized anxiety disorder: Secondary | ICD-10-CM

## 2013-12-11 DIAGNOSIS — R413 Other amnesia: Secondary | ICD-10-CM

## 2013-12-11 NOTE — Assessment & Plan Note (Signed)
Poor appetite, no sleep/rest well at night, better with  Mirtazapine 7.5mg  qhs and Lorazepam to 0.5mg  q8hr prn

## 2013-12-11 NOTE — Assessment & Plan Note (Signed)
Controlled.  

## 2013-12-11 NOTE — Assessment & Plan Note (Signed)
New RLE skin tear w/o s/s infection.

## 2013-12-11 NOTE — Assessment & Plan Note (Signed)
Worse since the patient is declining physically and cognitively-high risk of falling. Intensive supervision needed.

## 2013-12-11 NOTE — Assessment & Plan Note (Signed)
Sleeps better with Mirtazapine 7.5mg  qhs

## 2013-12-11 NOTE — Assessment & Plan Note (Signed)
SLUMS 1/30 11/17/13--MOST indicated comfort measures only. SNF for care.

## 2013-12-11 NOTE — Assessment & Plan Note (Signed)
Sustained from a recent fall, X-ray confirmed a transverse nondisplaced possible incomplete fracture of the distal radial metaphysis. Splint applied for comfort purpose and protection. Assist the patient with ADL as needed. Observe the right wrist and hand for neurovascular symptoms.

## 2013-12-11 NOTE — Assessment & Plan Note (Addendum)
Suprapubic area-stage II, saline gauze packing daily until healed, monitor for s/s of infection. Healing slowly.

## 2013-12-11 NOTE — Assessment & Plan Note (Signed)
Mild, Hgb 11.0, MCV 95.1 and MCH 31.4 12/09/13-no active bleed. Will continue to monitor the patient. Continue dietary supplement.

## 2013-12-11 NOTE — Assessment & Plan Note (Signed)
Prn MiraLax is adequate.  

## 2013-12-11 NOTE — Progress Notes (Signed)
Patient ID: Carol [name], female   DOB: 12/17/1927, 80 y.o.   MRN: [id number]   Code Status:  DNR MOST  No Known Allergies  Chief Complaint  Patient presents with  . Medical Managment of Chronic Issues    UTI, the right distal radial metaphysis fx, RLE skin tear.   . Acute Visit    HPI: Patient is a 80 y.o. female seen in the SNF at [facility] today for evaluation of the right wrist fx, UTI,  BLE edema, insomnia, anxiety , O2 desaturation, memory loss,  pressure ulcers,  and other chronic medical conditions.   Hospitalized from 11/04/2013 to 11/06/2013. She presented to ED after being found down and unresponsive at her independent living facility. It was estimated that she was potentially on the floor for 24 hours.  Her Acute encephalopathy: Neurological exam is nonfocal. MRI was subsequently found to not be necessary. EEG was within normal limits. Encephalopathy was felt to be from metabolic factors. Patient appears back to her baseline. Has palliative consult while in hospital: Irena Reichmann, NP, Palliative care-comfort measures.   Problem List Items Addressed This Visit   UTI (urinary tract infection) - Primary     Recurrent, urinary culture 12/11/13 showed enterococcus >100,000c/ml, Nitrofurantoin 100mg  bid x 7 days along with FloraStor bid, observe the patient.     Unspecified essential hypertension     Controlled.     Unspecified constipation     Prn MiraLax is adequate.      Traumatic closed nondisp torus fracture of distal radial metaphysis     Sustained from a recent fall, X-ray confirmed a transverse nondisplaced possible incomplete fracture of the distal radial metaphysis. Splint applied for comfort purpose and protection. Assist the patient with ADL as needed. Observe the right wrist and hand for neurovascular symptoms.     Soft tissue injury to right hip and suprapubic area     Developed into a pressure ulcer in suprapubic area. The right hip area wound is healed.      Memory deficit     SLUMS 1/30 11/17/13--MOST indicated comfort measures only. SNF for care.     Insomnia, unspecified     Sleeps better with Mirtazapine 7.5mg  qhs     Generalized anxiety disorder     Poor appetite, no sleep/rest well at night, better with  Mirtazapine 7.5mg  qhs and Lorazepam to 0.5mg  q8hr prn      Decubitus ulcer     Suprapubic area-stage II, saline gauze packing daily until healed, monitor for s/s of infection. Healing slowly.      Anemia of chronic disease     Mild, Hgb 11.0, MCV 95.1 and MCH 31.4 12/09/13-no active bleed. Will continue to monitor the patient. Continue dietary supplement.     Abrasion     New RLE skin tear w/o s/s infection.     Abnormality of gait     Worse since the patient is declining physically and cognitively-high risk of falling. Intensive supervision needed.        Review of Systems:  Review of Systems  Constitutional: Positive for malaise/fatigue. Negative for fever, chills, weight loss and diaphoresis.  HENT: Positive for hearing loss. Negative for congestion, ear discharge, ear pain, nosebleeds, sore throat and tinnitus.   Eyes: Negative for blurred vision, double vision, photophobia, pain, discharge and redness.  Respiratory: Positive for cough. Negative for hemoptysis, sputum production, shortness of breath, wheezing and stridor.   Cardiovascular: Positive for leg swelling. Negative for chest pain, palpitations, orthopnea,  claudication and PND.  Gastrointestinal: Negative for heartburn, nausea, vomiting, abdominal pain, diarrhea, constipation, blood in stool and melena.  Genitourinary: Negative for dysuria, urgency, frequency, hematuria and flank pain.  Musculoskeletal: Positive for falls and joint pain. Negative for back pain, myalgias and neck pain.       Tight right wrist/hand swelling, warmth, tenderness-distal radial metaphysis fx.   Skin: Negative for itching and rash.        Soft tissue injury the right lower abd and  suprapubic area noted  developing into pressure ulcer.   Neurological: Negative for dizziness, tingling, tremors, sensory change, speech change, focal weakness, seizures, loss of consciousness, weakness and headaches.  Endo/Heme/Allergies: Negative for environmental allergies and polydipsia. Does not bruise/bleed easily.  Psychiatric/Behavioral: Positive for memory loss. Negative for depression, suicidal ideas, hallucinations and substance abuse. The patient is nervous/anxious and has insomnia.      Past Medical History  Diagnosis Date  . AK (actinic keratosis)     right tip of nose and right posterior leg  . Cancer 08/07/12    squamous cell ca,keratoacanthoma-right posterior leg  . Skin cancer 06/2011    scc right elbow tx included excision  . Radiation 09/10/2012-10/24/2012    30 fractions to right posterior calf  . Thyroid disease   . Anemia   . Hypertension   . Varicose vein of leg   . Hemorrhoids   . Spastic colon   . Insomnia, unspecified   . Unspecified urinary incontinence   . Hypoxemia   . Unspecified hypothyroidism   . Abnormality of gait   . Arthritis   . Osteoarthrosis, unspecified whether generalized or localized, unspecified site   . Radiation 09/10/12-10/24/12    Squamous cell right posterior calf 6000 cGy  . Traumatic closed nondisp torus fracture of distal radial metaphysis 12/08/13   Past Surgical History  Procedure Laterality Date  . Abdominal hysterectomy      early 40's, abdominal  . Appendectomy    . Anterior heel repair    . Total hip arthroplasty  07/31/2010    right  . Pheochromocytoma      left  . Fracture surgery  2003    left   . Wrist fracture surgery  2003    left Dr. Newman Nip   Social History:   reports that she quit smoking about 35 years ago. Her smoking use included Cigarettes. She has a 5 pack-year smoking history. She has never used smokeless tobacco. She reports that she drinks about 1.8 ounces of alcohol per week. She reports that  she does not use illicit drugs.  Family History  Problem Relation Age of Onset  . Stroke Mother   . Stroke Father     Medications: Patient's Medications  New Prescriptions   No medications on file  Previous Medications   ACETAMINOPHEN (TYLENOL) 325 MG TABLET    Take 2 tablets (650 mg total) by mouth every 4 (four) hours as needed for mild pain (temperature >/= 99.5 F).   ASPIRIN 81 MG TABLET    Take 1 tablet (81 mg total) by mouth daily.   BETA CAROTENE W/MINERALS (OCUVITE) TABLET    Take 1 tablet by mouth daily. For eyes   FEEDING SUPPLEMENT, ENSURE COMPLETE, (ENSURE COMPLETE) LIQD    Take 237 mLs by mouth 2 (two) times daily between meals.   LORAZEPAM (ATIVAN) 0.5 MG TABLET    Take 1 tablet (0.5 mg total) by mouth every 8 (eight) hours.   MIRTAZAPINE (REMERON) 7.5 MG TABLET  Take 7.5 mg by mouth at bedtime.   OMEGA-3 FATTY ACIDS (FISH OIL) 1000 MG CAPS    Take by mouth. Take one capsule a day   POLYETHYLENE GLYCOL (MIRALAX / GLYCOLAX) PACKET    Take 17 g by mouth daily as needed.   TRIAMCINOLONE CREAM (KENALOG) 0.1 %    Apply 1 application topically 2 (two) times daily.  Modified Medications   No medications on file  Discontinued Medications   No medications on file     Physical Exam: Physical Exam  Constitutional: She is oriented to person, place, and time. She appears well-developed and well-nourished. No distress.  HENT:  Head: Normocephalic and atraumatic.  Right Ear: External ear normal.  Left Ear: External ear normal.  Nose: Nose normal.  Mouth/Throat: Oropharynx is clear and moist. No oropharyngeal exudate.  Eyes: Conjunctivae and EOM are normal. Pupils are equal, round, and reactive to light. Right eye exhibits no discharge. Left eye exhibits no discharge. No scleral icterus.  Neck: Normal range of motion. Neck supple. No JVD present. No tracheal deviation present. No thyromegaly present.  Cardiovascular: Normal rate, regular rhythm, normal heart sounds and intact  distal pulses.  Exam reveals no gallop and no friction rub.   No murmur heard. Pulmonary/Chest: Effort normal and breath sounds normal. No stridor. No respiratory distress. She has no wheezes. She has no rales. She exhibits no tenderness.  Abdominal: Soft. Bowel sounds are normal. She exhibits no distension and no mass. There is no tenderness. There is no rebound and no guarding.  Musculoskeletal: Normal range of motion. She exhibits edema. She exhibits no tenderness.  1+ edema BLE  Lymphadenopathy:    She has no cervical adenopathy.  Neurological: She is alert and oriented to person, place, and time. She has normal reflexes. No cranial nerve deficit. She exhibits normal muscle tone. Coordination normal.  Skin: No rash noted. She is not diaphoretic. There is erythema. No pallor.  Abrasion left knee and left great toe-healed. Soft tissue injury the right lower abd and suprapubic area  developing into pressure ulcer-stage II 4x4cm, 0.5cm in depth with yellow slough covered wound bed.   Psychiatric: She has a normal mood and affect. Her behavior is normal. Judgment and thought content normal. Cognition and memory are impaired. She exhibits abnormal recent memory. She exhibits normal remote memory.   (type .physexam) Filed Vitals:   12/11/13 1002  BP: 154/90  Pulse: 100  Temp: 99 F (37.2 C)  TempSrc: Tympanic  Resp: 20      Labs reviewed: Basic Metabolic Panel:  Recent Labs  11/04/13 1814 11/05/13 0400 11/12/13 11/21/13 12/09/13  NA 130* 130* 144 142 141  K 3.6* 3.5* 4.2 4.6 4.0  CL 85* 91*  --   --   --   CO2 27 26  --   --   --   GLUCOSE 101* 93  --   --   --   BUN 31* 45* 22* 19 23*  CREATININE 0.65 1.04 0.4* 0.6 0.6  CALCIUM 9.3 8.6  --   --   --   MG 2.0  --   --   --   --   TSH  --   --  2.24  --   --    Liver Function Tests:  Recent Labs  11/04/13 1814 11/05/13 0400 11/12/13 11/21/13  AST 185* 168* 40* 19  ALT 32 29 20 10   ALKPHOS 83 63 53 74  BILITOT 0.9 0.4   --   --  PROT 7.5 6.1  --   --   ALBUMIN 3.8 3.1*  --   --    CBC:  Recent Labs  11/04/13 1814 11/05/13 0400 11/12/13 11/21/13 12/09/13  WBC 6.6 6.6 7.3 7.7 7.7  NEUTROABS 5.6 5.3  --   --   --   HGB 17.8* 15.0 14.1 12.2 11.0*  HCT 49.8* 42.7 41 36 33*  MCV 92.7 93.4  --   --   --   PLT 159 164 226 236 267   Past Procedures:  11/04/2013 CLINICAL DATA: Fall, weakness EXAM: CHEST - 1 VIEW COMPARISON: IMPRESSION: Stable hyperinflation. No acute process   11/04/2013 CLINICAL DATA: Fall EXAM: CT HEAD WITHOUT CONTRAST CT MAXILLOFACIAL WITHOUT CONTRAST CT CERVICAL SPINE WITHOUT CONTRAST: IMPRESSION: Mild soft tissue swelling overlying the right frontal bone/orbit. No evidence of acute intracranial abnormality. Atrophy with small vessel ischemic changes and intracranial atherosclerosis. No evidence of maxillofacial fracture. No evidence of traumatic injury to the cervical spine. Moderate multilevel degenerative changes.   11/05/13: EEG: IMPRESSION: This is a normal awake EEG  11/15/13 CXR no radiographic evidence of acute pulmonary disease.  12/08/13 X-ray R wrist: a transverse nondisplaced possible incomplete fracture of the distal radial metaphysis  12/08/13 CXR borderline to slightly elevated pulmonary vasculature. The lungs are clear.   12/08/13 X-ray R hand: severe osteopenia, moderate to severe degenerative arthritic changes interphalangeal joints of the fingers and thumb.   Assessment/Plan UTI (urinary tract infection) Recurrent, urinary culture 12/11/13 showed enterococcus >100,000c/ml, Nitrofurantoin 100mg  bid x 7 days along with FloraStor bid, observe the patient.   Traumatic closed nondisp torus fracture of distal radial metaphysis Sustained from a recent fall, X-ray confirmed a transverse nondisplaced possible incomplete fracture of the distal radial metaphysis. Splint applied for comfort purpose and protection. Assist the patient with ADL as needed. Observe the right wrist and hand for  neurovascular symptoms.   Anemia of chronic disease Mild, Hgb 11.0, MCV 95.1 and MCH 31.4 12/09/13-no active bleed. Will continue to monitor the patient. Continue dietary supplement.   Abrasion New RLE skin tear w/o s/s infection.   Abnormality of gait Worse since the patient is declining physically and cognitively-high risk of falling. Intensive supervision needed.   Generalized anxiety disorder Poor appetite, no sleep/rest well at night, better with  Mirtazapine 7.5mg  qhs and Lorazepam to 0.5mg  q8hr prn    Insomnia, unspecified Sleeps better with Mirtazapine 7.5mg  qhs   Memory deficit SLUMS 1/30 11/17/13--MOST indicated comfort measures only. SNF for care.   Unspecified constipation Prn MiraLax is adequate.    Unspecified essential hypertension Controlled.   Decubitus ulcer Suprapubic area-stage II, saline gauze packing daily until healed, monitor for s/s of infection. Healing slowly.    Soft tissue injury to right hip and suprapubic area Developed into a pressure ulcer in suprapubic area. The right hip area wound is healed.      Family/ Staff Communication: observe the patient.   Goals of Care: SNF  Labs/tests ordered: none

## 2013-12-11 NOTE — Assessment & Plan Note (Signed)
Recurrent, urinary culture 12/11/13 showed enterococcus >100,000c/ml, Nitrofurantoin 100mg  bid x 7 days along with FloraStor bid, observe the patient.

## 2013-12-11 NOTE — Assessment & Plan Note (Signed)
Developed into a pressure ulcer in suprapubic area. The right hip area wound is healed.

## 2013-12-18 ENCOUNTER — Non-Acute Institutional Stay (SKILLED_NURSING_FACILITY): Payer: Medicare Other | Admitting: Nurse Practitioner

## 2013-12-18 ENCOUNTER — Encounter: Payer: Self-pay | Admitting: Nurse Practitioner

## 2013-12-18 DIAGNOSIS — L8990 Pressure ulcer of unspecified site, unspecified stage: Secondary | ICD-10-CM

## 2013-12-18 DIAGNOSIS — R413 Other amnesia: Secondary | ICD-10-CM

## 2013-12-18 DIAGNOSIS — F411 Generalized anxiety disorder: Secondary | ICD-10-CM

## 2013-12-18 DIAGNOSIS — IMO0002 Reserved for concepts with insufficient information to code with codable children: Secondary | ICD-10-CM

## 2013-12-18 DIAGNOSIS — I10 Essential (primary) hypertension: Secondary | ICD-10-CM

## 2013-12-18 DIAGNOSIS — E039 Hypothyroidism, unspecified: Secondary | ICD-10-CM

## 2013-12-18 DIAGNOSIS — N390 Urinary tract infection, site not specified: Secondary | ICD-10-CM

## 2013-12-18 DIAGNOSIS — S52529A Torus fracture of lower end of unspecified radius, initial encounter for closed fracture: Secondary | ICD-10-CM

## 2013-12-18 DIAGNOSIS — R0902 Hypoxemia: Secondary | ICD-10-CM

## 2013-12-18 DIAGNOSIS — G4700 Insomnia, unspecified: Secondary | ICD-10-CM

## 2013-12-18 NOTE — Assessment & Plan Note (Signed)
Sustained from a recent fall, X-ray confirmed a transverse nondisplaced possible incomplete fracture of the distal radial metaphysis. Splint applied for comfort purpose and protection. Assist the patient with ADL as needed. Observe the right wrist and hand for neurovascular symptoms. Less swelling and heat today.

## 2013-12-18 NOTE — Assessment & Plan Note (Signed)
Suprapubic area-stage II, saline gauze packing daily until healed, monitor for s/s of infection. Healing slowly.   

## 2013-12-18 NOTE — Progress Notes (Signed)
Patient ID: Carol [name], female   DOB: July 05, 1928, 80 y.o.   MRN: [id number]   Code Status:  DNR MOST  No Known Allergies  Chief Complaint  Patient presents with  . Medical Managment of Chronic Issues  . Acute Visit    insomnia, fearful to be in her room alone at night.     HPI: Patient is a 80 y.o. female seen in the SNF at [facility] today for evaluation of the right wrist fx, UTI,  BLE edema, insomnia, anxiety-fearful to be alone in her room , O2 desaturation, memory loss,  pressure ulcers,  and other chronic medical conditions.   Hospitalized from 11/04/2013 to 11/06/2013. She presented to ED after being found down and unresponsive at her independent living facility. It was estimated that she was potentially on the floor for 24 hours.  Her Acute encephalopathy: Neurological exam is nonfocal. MRI was subsequently found to not be necessary. EEG was within normal limits. Encephalopathy was felt to be from metabolic factors. Patient appears back to her baseline. Has palliative consult while in hospital: Irena Reichmann, NP, Palliative care-comfort measures.   Problem List Items Addressed This Visit   UTI (urinary tract infection) - Primary     Recurrent, urinary culture 12/11/13 showed enterococcus >100,000c/ml, completed  Nitrofurantoin 100mg  bid x 7 days along with FloraStor bid, observe the patient.      Unspecified hypothyroidism     TSH 2.238 11/12/13, not taking thyroid supplement.       Unspecified essential hypertension     Controlled.      Traumatic closed nondisp torus fracture of distal radial metaphysis     Sustained from a recent fall, X-ray confirmed a transverse nondisplaced possible incomplete fracture of the distal radial metaphysis. Splint applied for comfort purpose and protection. Assist the patient with ADL as needed. Observe the right wrist and hand for neurovascular symptoms. Less swelling and heat today.     Memory deficit     SLUMS 1/30 11/17/13--MOST indicated  comfort measures only. SNF for care.      Insomnia, unspecified     Sleeps better with Mirtazapine 7.5mg  qhs. Usually sleeps from 6-7pm to 12-1am, than she is fearful to be alone in her room. Lorazepam 0.5mg  po q8r seems not helping. Will increase Mirtazapine to 15mg  po qhs and have Lorazepam 1mg  q8hr prn available to her.      Hypoxemia     O2 as needed to maintain Sat O2>90%    Generalized anxiety disorder     Poor appetite is improved, no sleep/rest well at night-better that she sleeps from 6-7 pm to 12-1am, but she is fearful to be alone in early am-will increase Mirtazapine to 15mg  and make Lorazepam 1mg  q8hr prn available to her instead of 0.5mg  q8hr.      Decubitus ulcer     Suprapubic area-stage II, saline gauze packing daily until healed, monitor for s/s of infection. Healing slowly.          Review of Systems:  Review of Systems  Constitutional: Negative for fever, chills, weight loss, malaise/fatigue and diaphoresis.  HENT: Positive for hearing loss. Negative for congestion, ear discharge, ear pain, nosebleeds, sore throat and tinnitus.   Eyes: Negative for blurred vision, double vision, photophobia, pain, discharge and redness.  Respiratory: Positive for cough. Negative for hemoptysis, sputum production, shortness of breath, wheezing and stridor.   Cardiovascular: Positive for leg swelling. Negative for chest pain, palpitations, orthopnea, claudication and PND.  Trace edema BLE  Gastrointestinal: Negative for heartburn, nausea, vomiting, abdominal pain, diarrhea, constipation, blood in stool and melena.  Genitourinary: Negative for dysuria, urgency, frequency, hematuria and flank pain.  Musculoskeletal: Positive for falls and joint pain. Negative for back pain, myalgias and neck pain.       Tight right wrist/hand swelling, warmth, tenderness-distal radial metaphysis fx.   Skin: Negative for itching and rash.        Soft tissue injury the right lower abd and  suprapubic area noted  developing into pressure ulcer. Left lower leg skin tear.   Neurological: Negative for dizziness, tingling, tremors, sensory change, speech change, focal weakness, seizures, loss of consciousness, weakness and headaches.  Endo/Heme/Allergies: Negative for environmental allergies and polydipsia. Does not bruise/bleed easily.  Psychiatric/Behavioral: Positive for memory loss. Negative for depression, suicidal ideas, hallucinations and substance abuse. The patient is nervous/anxious and has insomnia.      Past Medical History  Diagnosis Date  . AK (actinic keratosis)     right tip of nose and right posterior leg  . Cancer 08/07/12    squamous cell ca,keratoacanthoma-right posterior leg  . Skin cancer 06/2011    scc right elbow tx included excision  . Radiation 09/10/2012-10/24/2012    30 fractions to right posterior calf  . Thyroid disease   . Anemia   . Hypertension   . Varicose vein of leg   . Hemorrhoids   . Spastic colon   . Insomnia, unspecified   . Unspecified urinary incontinence   . Hypoxemia   . Unspecified hypothyroidism   . Abnormality of gait   . Arthritis   . Osteoarthrosis, unspecified whether generalized or localized, unspecified site   . Radiation 09/10/12-10/24/12    Squamous cell right posterior calf 6000 cGy  . Traumatic closed nondisp torus fracture of distal radial metaphysis 12/08/13   Past Surgical History  Procedure Laterality Date  . Abdominal hysterectomy      early 40's, abdominal  . Appendectomy    . Anterior heel repair    . Total hip arthroplasty  07/31/2010    right  . Pheochromocytoma      left  . Fracture surgery  2003    left   . Wrist fracture surgery  2003    left Dr. Newman Nip   Social History:   reports that she quit smoking about 35 years ago. Her smoking use included Cigarettes. She has a 5 pack-year smoking history. She has never used smokeless tobacco. She reports that she drinks about 1.8 ounces of alcohol  per week. She reports that she does not use illicit drugs.  Family History  Problem Relation Age of Onset  . Stroke Mother   . Stroke Father     Medications: Patient's Medications  New Prescriptions   No medications on file  Previous Medications   ACETAMINOPHEN (TYLENOL) 325 MG TABLET    Take 2 tablets (650 mg total) by mouth every 4 (four) hours as needed for mild pain (temperature >/= 99.5 F).   ASPIRIN 81 MG TABLET    Take 1 tablet (81 mg total) by mouth daily.   BETA CAROTENE W/MINERALS (OCUVITE) TABLET    Take 1 tablet by mouth daily. For eyes   FEEDING SUPPLEMENT, ENSURE COMPLETE, (ENSURE COMPLETE) LIQD    Take 237 mLs by mouth 2 (two) times daily between meals.   LORAZEPAM (ATIVAN) 1 MG TABLET    Take 1 mg by mouth every 8 (eight) hours as needed for anxiety.  MIRTAZAPINE (REMERON) 15 MG TABLET    Take 15 mg by mouth at bedtime.   OMEGA-3 FATTY ACIDS (FISH OIL) 1000 MG CAPS    Take by mouth. Take one capsule a day   POLYETHYLENE GLYCOL (MIRALAX / GLYCOLAX) PACKET    Take 17 g by mouth daily as needed.   TRIAMCINOLONE CREAM (KENALOG) 0.1 %    Apply 1 application topically 2 (two) times daily.  Modified Medications   No medications on file  Discontinued Medications   LORAZEPAM (ATIVAN) 0.5 MG TABLET    Take 1 tablet (0.5 mg total) by mouth every 8 (eight) hours.   MIRTAZAPINE (REMERON) 7.5 MG TABLET    Take 7.5 mg by mouth at bedtime.     Physical Exam: Physical Exam  Constitutional: She is oriented to person, place, and time. She appears well-developed and well-nourished. No distress.  HENT:  Head: Normocephalic and atraumatic.  Right Ear: External ear normal.  Left Ear: External ear normal.  Nose: Nose normal.  Mouth/Throat: Oropharynx is clear and moist. No oropharyngeal exudate.  Eyes: Conjunctivae and EOM are normal. Pupils are equal, round, and reactive to light. Right eye exhibits no discharge. Left eye exhibits no discharge. No scleral icterus.  Neck: Normal  range of motion. Neck supple. No JVD present. No tracheal deviation present. No thyromegaly present.  Cardiovascular: Normal rate, regular rhythm, normal heart sounds and intact distal pulses.  Exam reveals no gallop and no friction rub.   No murmur heard. Pulmonary/Chest: Effort normal and breath sounds normal. No stridor. No respiratory distress. She has no wheezes. She has no rales. She exhibits no tenderness.  Abdominal: Soft. Bowel sounds are normal. She exhibits no distension and no mass. There is no tenderness. There is no rebound and no guarding.  Musculoskeletal: Normal range of motion. She exhibits edema. She exhibits no tenderness.  Trace edema BLE  Lymphadenopathy:    She has no cervical adenopathy.  Neurological: She is alert and oriented to person, place, and time. She has normal reflexes. No cranial nerve deficit. She exhibits normal muscle tone. Coordination normal.  Skin: No rash noted. She is not diaphoretic. There is erythema. No pallor.  Abrasion left knee and left great toe-healed. Soft tissue injury the right lower abd and suprapubic area  developing into pressure ulcer-stage II 4x4cm, 0.5cm in depth with yellow slough covered wound bed. The new skin tear at the LLE w/o s/s of infection.   Psychiatric: She has a normal mood and affect. Her behavior is normal. Judgment and thought content normal. Cognition and memory are impaired. She exhibits abnormal recent memory. She exhibits normal remote memory.   (type .physexam) Filed Vitals:   12/18/13 1352  BP: 140/82  Pulse: 104  Temp: 97 F (36.1 C)  TempSrc: Tympanic  Resp: 20      Labs reviewed: Basic Metabolic Panel:  Recent Labs  11/04/13 1814 11/05/13 0400 11/12/13 11/21/13 12/09/13  NA 130* 130* 144 142 141  K 3.6* 3.5* 4.2 4.6 4.0  CL 85* 91*  --   --   --   CO2 27 26  --   --   --   GLUCOSE 101* 93  --   --   --   BUN 31* 45* 22* 19 23*  CREATININE 0.65 1.04 0.4* 0.6 0.6  CALCIUM 9.3 8.6  --   --   --    MG 2.0  --   --   --   --   TSH  --   --  2.24  --   --    Liver Function Tests:  Recent Labs  11/04/13 1814 11/05/13 0400 11/12/13 11/21/13  AST 185* 168* 40* 19  ALT 32 29 20 10   ALKPHOS 83 63 53 74  BILITOT 0.9 0.4  --   --   PROT 7.5 6.1  --   --   ALBUMIN 3.8 3.1*  --   --    CBC:  Recent Labs  11/04/13 1814 11/05/13 0400 11/12/13 11/21/13 12/09/13  WBC 6.6 6.6 7.3 7.7 7.7  NEUTROABS 5.6 5.3  --   --   --   HGB 17.8* 15.0 14.1 12.2 11.0*  HCT 49.8* 42.7 41 36 33*  MCV 92.7 93.4  --   --   --   PLT 159 164 226 236 267   Past Procedures:  11/04/2013 CLINICAL DATA: Fall, weakness EXAM: CHEST - 1 VIEW COMPARISON: IMPRESSION: Stable hyperinflation. No acute process   11/04/2013 CLINICAL DATA: Fall EXAM: CT HEAD WITHOUT CONTRAST CT MAXILLOFACIAL WITHOUT CONTRAST CT CERVICAL SPINE WITHOUT CONTRAST: IMPRESSION: Mild soft tissue swelling overlying the right frontal bone/orbit. No evidence of acute intracranial abnormality. Atrophy with small vessel ischemic changes and intracranial atherosclerosis. No evidence of maxillofacial fracture. No evidence of traumatic injury to the cervical spine. Moderate multilevel degenerative changes.   11/05/13: EEG: IMPRESSION: This is a normal awake EEG  11/15/13 CXR no radiographic evidence of acute pulmonary disease.  12/08/13 X-ray R wrist: a transverse nondisplaced possible incomplete fracture of the distal radial metaphysis  12/08/13 CXR borderline to slightly elevated pulmonary vasculature. The lungs are clear.   12/08/13 X-ray R hand: severe osteopenia, moderate to severe degenerative arthritic changes interphalangeal joints of the fingers and thumb.   Assessment/Plan UTI (urinary tract infection) Recurrent, urinary culture 12/11/13 showed enterococcus >100,000c/ml, completed  Nitrofurantoin 100mg  bid x 7 days along with FloraStor bid, observe the patient.    Unspecified hypothyroidism TSH 2.238 11/12/13, not taking thyroid supplement.      Unspecified essential hypertension Controlled.    Memory deficit SLUMS 1/30 11/17/13--MOST indicated comfort measures only. SNF for care.    Insomnia, unspecified Sleeps better with Mirtazapine 7.5mg  qhs. Usually sleeps from 6-7pm to 12-1am, than she is fearful to be alone in her room. Lorazepam 0.5mg  po q8r seems not helping. Will increase Mirtazapine to 15mg  po qhs and have Lorazepam 1mg  q8hr prn available to her.    Hypoxemia O2 as needed to maintain Sat O2>90%  Generalized anxiety disorder Poor appetite is improved, no sleep/rest well at night-better that she sleeps from 6-7 pm to 12-1am, but she is fearful to be alone in early am-will increase Mirtazapine to 15mg  and make Lorazepam 1mg  q8hr prn available to her instead of 0.5mg  q8hr.    Decubitus ulcer Suprapubic area-stage II, saline gauze packing daily until healed, monitor for s/s of infection. Healing slowly.     Traumatic closed nondisp torus fracture of distal radial metaphysis Sustained from a recent fall, X-ray confirmed a transverse nondisplaced possible incomplete fracture of the distal radial metaphysis. Splint applied for comfort purpose and protection. Assist the patient with ADL as needed. Observe the right wrist and hand for neurovascular symptoms. Less swelling and heat today.     Family/ Staff Communication: observe the patient.   Goals of Care: SNF  Labs/tests ordered: none

## 2013-12-18 NOTE — Assessment & Plan Note (Signed)
Controlled.  

## 2013-12-18 NOTE — Assessment & Plan Note (Signed)
Poor appetite is improved, no sleep/rest well at night-better that she sleeps from 6-7 pm to 12-1am, but she is fearful to be alone in early am-will increase Mirtazapine to 15mg  and make Lorazepam 1mg  q8hr prn available to her instead of 0.5mg  q8hr.

## 2013-12-18 NOTE — Assessment & Plan Note (Signed)
TSH 2.238 11/12/13, not taking thyroid supplement.    

## 2013-12-18 NOTE — Assessment & Plan Note (Signed)
Sleeps better with Mirtazapine 7.5mg  qhs. Usually sleeps from 6-7pm to 12-1am, than she is fearful to be alone in her room. Lorazepam 0.5mg  po q8r seems not helping. Will increase Mirtazapine to 15mg  po qhs and have Lorazepam 1mg  q8hr prn available to her.

## 2013-12-18 NOTE — Assessment & Plan Note (Signed)
O2 as needed to maintain Sat O2>90%

## 2013-12-18 NOTE — Assessment & Plan Note (Signed)
Recurrent, urinary culture 12/11/13 showed enterococcus >100,000c/ml, completed  Nitrofurantoin 100mg  bid x 7 days along with FloraStor bid, observe the patient.

## 2013-12-18 NOTE — Assessment & Plan Note (Signed)
SLUMS 1/30 11/17/13--MOST indicated comfort measures only. SNF for care.  

## 2013-12-29 ENCOUNTER — Encounter: Payer: Self-pay | Admitting: Nurse Practitioner

## 2013-12-29 ENCOUNTER — Encounter: Payer: Self-pay | Admitting: Internal Medicine

## 2013-12-29 ENCOUNTER — Non-Acute Institutional Stay (SKILLED_NURSING_FACILITY): Payer: Medicare Other | Admitting: Nurse Practitioner

## 2013-12-29 DIAGNOSIS — K5900 Constipation, unspecified: Secondary | ICD-10-CM

## 2013-12-29 DIAGNOSIS — IMO0002 Reserved for concepts with insufficient information to code with codable children: Secondary | ICD-10-CM

## 2013-12-29 DIAGNOSIS — I8310 Varicose veins of unspecified lower extremity with inflammation: Secondary | ICD-10-CM

## 2013-12-29 DIAGNOSIS — E039 Hypothyroidism, unspecified: Secondary | ICD-10-CM

## 2013-12-29 DIAGNOSIS — R269 Unspecified abnormalities of gait and mobility: Secondary | ICD-10-CM

## 2013-12-29 DIAGNOSIS — F411 Generalized anxiety disorder: Secondary | ICD-10-CM

## 2013-12-29 DIAGNOSIS — I10 Essential (primary) hypertension: Secondary | ICD-10-CM

## 2013-12-29 DIAGNOSIS — S52529A Torus fracture of lower end of unspecified radius, initial encounter for closed fracture: Secondary | ICD-10-CM

## 2013-12-29 DIAGNOSIS — I872 Venous insufficiency (chronic) (peripheral): Secondary | ICD-10-CM

## 2013-12-29 NOTE — Progress Notes (Signed)
Patient ID: Carol [name], female   DOB: [birth year], 80 y.o.   MRN: [id number]   Code Status:  DNR MOST  No Known Allergies  Chief Complaint  Patient presents with  . Medical Managment of Chronic Issues  . Acute Visit    right wrist pain    HPI: Patient is a 80 y.o. female seen in the SNF at [facility] today for evaluation of the right wrist fx, BLE edema, insomnia, anxiety-fearful to be alone in her room , O2 desaturation, memory loss,  pressure ulcers,  and other chronic medical conditions.   Hospitalized from 11/04/2013 to 11/06/2013. She presented to ED after being found down and unresponsive at her independent living facility. It was estimated that she was potentially on the floor for 24 hours.  Her Acute encephalopathy: Neurological exam is nonfocal. MRI was subsequently found to not be necessary. EEG was within normal limits. Encephalopathy was felt to be from metabolic factors. Patient appears back to her baseline. Has palliative consult while in hospital: Irena Reichmann, NP, Palliative care-comfort measures.   Problem List Items Addressed This Visit   Abnormality of gait - Primary     Worse since the patient is declining physically and cognitively-high risk of falling. Intensive supervision needed. May consider dc Lorazepam since the patient has experienced CNS stimulation paradoxical negative side effects which increases her risk of falling.      Generalized anxiety disorder     Poor appetite is improved, no sleep/rest well at night-better that she sleeps from 6-7 pm to 12-1am, she is no lonhrt  fearful to be alone in early am-since Mirtazapine15mg  and dc Lorazepam 1mg  q8hr prn available to her instead of 0.5mg  q8hr-the patient seems experienced paradoxical CNS stimulating side effects.      Traumatic closed nondisp torus fracture of distal radial metaphysis     Mild pain with the right wrist ROM or making fist, Tylenol is adequate for pain control, 01/02/14 Ortho consultation:  healing nondisplaced distal radius fracture. Continue with brace x 3weeks, WBAT OK to leave brace off while in bed.     Unspecified constipation     Prn MiraLax is adequate.     Unspecified essential hypertension     Controlled.      Unspecified hypothyroidism     TSH 2.238 11/12/13, not taking thyroid supplement.       Venous stasis dermatitis     Chronic BLE edema and pigmentation BLE       Review of Systems:  Review of Systems  Constitutional: Negative for fever, chills, weight loss, malaise/fatigue and diaphoresis.  HENT: Positive for hearing loss. Negative for congestion, ear discharge, ear pain, nosebleeds, sore throat and tinnitus.   Eyes: Negative for blurred vision, double vision, photophobia, pain, discharge and redness.  Respiratory: Positive for cough. Negative for hemoptysis, sputum production, shortness of breath, wheezing and stridor.   Cardiovascular: Positive for leg swelling. Negative for chest pain, palpitations, orthopnea, claudication and PND.       Trace edema BLE  Gastrointestinal: Negative for heartburn, nausea, vomiting, abdominal pain, diarrhea, constipation, blood in stool and melena.  Genitourinary: Negative for dysuria, urgency, frequency, hematuria and flank pain.  Musculoskeletal: Positive for falls and joint pain. Negative for back pain, myalgias and neck pain.       Tight right wrist/hand swelling, warmth, tenderness-distal radial metaphysis fx.   Skin: Negative for itching and rash.        Soft tissue injury the right lower abd and suprapubic area  noted  developing into pressure ulcer. Left lower leg skin tear. Healing nicely.   Neurological: Negative for dizziness, tingling, tremors, sensory change, speech change, focal weakness, seizures, loss of consciousness, weakness and headaches.  Endo/Heme/Allergies: Negative for environmental allergies and polydipsia. Does not bruise/bleed easily.  Psychiatric/Behavioral: Positive for memory loss. Negative  for depression, suicidal ideas, hallucinations and substance abuse. The patient is nervous/anxious and has insomnia.        Sleeps better at night. Less fearful to be left alone.      Past Medical History  Diagnosis Date  . AK (actinic keratosis)     right tip of nose and right posterior leg  . Cancer 08/07/12    squamous cell ca,keratoacanthoma-right posterior leg  . Skin cancer 06/2011    [facility]cc right elbow tx included excision  . Radiation 09/10/2012-10/24/2012    30 fractions to right posterior calf  . Thyroid disease   . Anemia   . Hypertension   . Varicose vein of leg   . Hemorrhoids   . Spastic colon   . Insomnia, unspecified   . Unspecified urinary incontinence   . Hypoxemia   . Unspecified hypothyroidism   . Abnormality of gait   . Arthritis   . Osteoarthrosis, unspecified whether generalized or localized, unspecified site   . Radiation 09/10/12-10/24/12    Squamous cell right posterior calf 6000 cGy  . Traumatic closed nondisp torus fracture of distal radial metaphysis 12/08/13   Past Surgical History  Procedure Laterality Date  . Abdominal hysterectomy      early 40's, abdominal  . Appendectomy    . Anterior heel repair    . Total hip arthroplasty  07/31/2010    right  . Pheochromocytoma      left  . Fracture surgery  2003    left   . Wrist fracture surgery  2003    left Dr. Newman Nip   Social History:   reports that she quit smoking about 35 years ago. Her smoking use included Cigarettes. She has a 5 pack-year smoking history. She has never used smokeless tobacco. She reports that she drinks about 1.8 ounces of alcohol per week. She reports that she does not use illicit drugs.  Family History  Problem Relation Age of Onset  . Stroke Mother   . Stroke Father     Medications: Patient's Medications  New Prescriptions   No medications on file  Previous Medications   ACETAMINOPHEN (TYLENOL) 325 MG TABLET    Take 2 tablets (650 mg total) by mouth every 4  (four) hours as needed for mild pain (temperature >/= 99.5 F).   ASPIRIN 81 MG TABLET    Take 1 tablet (81 mg total) by mouth daily.   BETA CAROTENE W/MINERALS (OCUVITE) TABLET    Take 1 tablet by mouth daily. For eyes   FEEDING SUPPLEMENT, ENSURE COMPLETE, (ENSURE COMPLETE) LIQD    Take 237 mLs by mouth 2 (two) times daily between meals.   LORAZEPAM (ATIVAN) 1 MG TABLET    Take 1 mg by mouth every 8 (eight) hours as needed for anxiety.   MIRTAZAPINE (REMERON) 15 MG TABLET    Take 15 mg by mouth at bedtime.   OMEGA-3 FATTY ACIDS (FISH OIL) 1000 MG CAPS    Take by mouth. Take one capsule a day   POLYETHYLENE GLYCOL (MIRALAX / GLYCOLAX) PACKET    Take 17 g by mouth daily as needed.   TRIAMCINOLONE CREAM (KENALOG) 0.1 %    Apply  1 application topically 2 (two) times daily.  Modified Medications   No medications on file  Discontinued Medications   No medications on file     Physical Exam: Physical Exam  Constitutional: She is oriented to person, place, and time. She appears well-developed and well-nourished. No distress.  HENT:  Head: Normocephalic and atraumatic.  Right Ear: External ear normal.  Left Ear: External ear normal.  Nose: Nose normal.  Mouth/Throat: Oropharynx is clear and moist. No oropharyngeal exudate.  Eyes: Conjunctivae and EOM are normal. Pupils are equal, round, and reactive to light. Right eye exhibits no discharge. Left eye exhibits no discharge. No scleral icterus.  Neck: Normal range of motion. Neck supple. No JVD present. No tracheal deviation present. No thyromegaly present.  Cardiovascular: Normal rate, regular rhythm, normal heart sounds and intact distal pulses.  Exam reveals no gallop and no friction rub.   No murmur heard. Pulmonary/Chest: Effort normal and breath sounds normal. No stridor. No respiratory distress. She has no wheezes. She has no rales. She exhibits no tenderness.  Abdominal: Soft. Bowel sounds are normal. She exhibits no distension and no  mass. There is no tenderness. There is no rebound and no guarding.  Musculoskeletal: Normal range of motion. She exhibits edema. She exhibits no tenderness.  Trace edema BLE  Lymphadenopathy:    She has no cervical adenopathy.  Neurological: She is alert and oriented to person, place, and time. She has normal reflexes. No cranial nerve deficit. She exhibits normal muscle tone. Coordination normal.  Skin: No rash noted. She is not diaphoretic. There is erythema. No pallor.  Abrasion left knee and left great toe-healed. Soft tissue injury the right lower abd and suprapubic area  developing into pressure ulcer-stage II 4x4cm, 0.5cm in depth with yellow slough covered wound bed. The new skin tear at the LLE w/o s/s of infection. Healing nicely.   Psychiatric: She has a normal mood and affect. Her behavior is normal. Judgment and thought content normal. Cognition and memory are impaired. She exhibits abnormal recent memory. She exhibits normal remote memory.  Rather pleasant confused.    (type .physexam) Filed Vitals:   12/29/13 1455  BP: 142/84  Pulse: 78  Temp: 98.4 F (36.9 C)  TempSrc: Tympanic  Resp: 20  SpO2: 96%      Labs reviewed: Basic Metabolic Panel:  Recent Labs  11/04/13 1814 11/05/13 0400 11/12/13 11/21/13 12/09/13  NA 130* 130* 144 142 141  K 3.6* 3.5* 4.2 4.6 4.0  CL 85* 91*  --   --   --   CO2 27 26  --   --   --   GLUCOSE 101* 93  --   --   --   BUN 31* 45* 22* 19 23*  CREATININE 0.65 1.04 0.4* 0.6 0.6  CALCIUM 9.3 8.6  --   --   --   MG 2.0  --   --   --   --   TSH  --   --  2.24  --   --    Liver Function Tests:  Recent Labs  11/04/13 1814 11/05/13 0400 11/12/13 11/21/13  AST 185* 168* 40* 19  ALT 32 29 20 10   ALKPHOS 83 63 53 74  BILITOT 0.9 0.4  --   --   PROT 7.5 6.1  --   --   ALBUMIN 3.8 3.1*  --   --    CBC:  Recent Labs  11/04/13 1814 11/05/13 0400 11/12/13 11/21/13 12/09/13  WBC 6.6 6.6 7.3 7.7 7.7  NEUTROABS 5.6 5.3  --   --   --    HGB 17.8* 15.0 14.1 12.2 11.0*  HCT 49.8* 42.7 41 36 33*  MCV 92.7 93.4  --   --   --   PLT 159 164 226 236 267   Past Procedures:  11/04/2013 CLINICAL DATA: Fall, weakness EXAM: CHEST - 1 VIEW COMPARISON: IMPRESSION: Stable hyperinflation. No acute process   11/04/2013 CLINICAL DATA: Fall EXAM: CT HEAD WITHOUT CONTRAST CT MAXILLOFACIAL WITHOUT CONTRAST CT CERVICAL SPINE WITHOUT CONTRAST: IMPRESSION: Mild soft tissue swelling overlying the right frontal bone/orbit. No evidence of acute intracranial abnormality. Atrophy with small vessel ischemic changes and intracranial atherosclerosis. No evidence of maxillofacial fracture. No evidence of traumatic injury to the cervical spine. Moderate multilevel degenerative changes.   11/05/13: EEG: IMPRESSION: This is a normal awake EEG  11/15/13 CXR no radiographic evidence of acute pulmonary disease.  12/08/13 X-ray R wrist: a transverse nondisplaced possible incomplete fracture of the distal radial metaphysis  12/08/13 CXR borderline to slightly elevated pulmonary vasculature. The lungs are clear.   12/08/13 X-ray R hand: severe osteopenia, moderate to severe degenerative arthritic changes interphalangeal joints of the fingers and thumb.   Assessment/Plan Abnormality of gait Worse since the patient is declining physically and cognitively-high risk of falling. Intensive supervision needed. May consider dc Lorazepam since the patient has experienced CNS stimulation paradoxical negative side effects which increases her risk of falling.    Generalized anxiety disorder Poor appetite is improved, no sleep/rest well at night-better that she sleeps from 6-7 pm to 12-1am, she is no lonhrt  fearful to be alone in early am-since Mirtazapine15mg  and dc Lorazepam 1mg  q8hr prn available to her instead of 0.5mg  q8hr-the patient seems experienced paradoxical CNS stimulating side effects.    Unspecified hypothyroidism TSH 2.238 11/12/13, not taking thyroid supplement.      Venous stasis dermatitis Chronic BLE edema and pigmentation BLE  Unspecified essential hypertension Controlled.    Unspecified constipation Prn MiraLax is adequate.   Traumatic closed nondisp torus fracture of distal radial metaphysis Mild pain with the right wrist ROM or making fist, Tylenol is adequate for pain control, 01/02/14 Ortho consultation: healing nondisplaced distal radius fracture. Continue with brace x 3weeks, WBAT OK to leave brace off while in bed.     Family/ Staff Communication: observe the patient.   Goals of Care: SNF  Labs/tests ordered: none

## 2014-01-03 NOTE — Assessment & Plan Note (Signed)
Poor appetite is improved, no sleep/rest well at night-better that she sleeps from 6-7 pm to 12-1am, she is no lonhrt  fearful to be alone in early am-since Mirtazapine15mg  and dc Lorazepam 1mg  q8hr prn available to her instead of 0.5mg  q8hr-the patient seems experienced paradoxical CNS stimulating side effects.

## 2014-01-03 NOTE — Assessment & Plan Note (Signed)
Chronic BLE edema and pigmentation BLE

## 2014-01-03 NOTE — Assessment & Plan Note (Signed)
Prn MiraLax is adequate.

## 2014-01-03 NOTE — Assessment & Plan Note (Signed)
TSH 2.238 11/12/13, not taking thyroid supplement.    

## 2014-01-03 NOTE — Assessment & Plan Note (Addendum)
Worse since the patient is declining physically and cognitively-high risk of falling. Intensive supervision needed. May consider dc Lorazepam since the patient has experienced CNS stimulation paradoxical negative side effects which increases her risk of falling.

## 2014-01-03 NOTE — Assessment & Plan Note (Signed)
Mild pain with the right wrist ROM or making fist, Tylenol is adequate for pain control, 01/02/14 Ortho consultation: healing nondisplaced distal radius fracture. Continue with brace x 3weeks, WBAT OK to leave brace off while in bed.

## 2014-01-03 NOTE — Assessment & Plan Note (Signed)
Controlled.  

## 2014-01-12 ENCOUNTER — Encounter: Payer: Self-pay | Admitting: Nurse Practitioner

## 2014-01-12 ENCOUNTER — Non-Acute Institutional Stay (SKILLED_NURSING_FACILITY): Payer: Medicare Other | Admitting: Nurse Practitioner

## 2014-01-12 DIAGNOSIS — IMO0002 Reserved for concepts with insufficient information to code with codable children: Secondary | ICD-10-CM

## 2014-01-12 DIAGNOSIS — T148XXA Other injury of unspecified body region, initial encounter: Secondary | ICD-10-CM

## 2014-01-12 DIAGNOSIS — F411 Generalized anxiety disorder: Secondary | ICD-10-CM

## 2014-01-12 DIAGNOSIS — M25569 Pain in unspecified knee: Secondary | ICD-10-CM

## 2014-01-12 DIAGNOSIS — R413 Other amnesia: Secondary | ICD-10-CM

## 2014-01-12 DIAGNOSIS — M25562 Pain in left knee: Secondary | ICD-10-CM | POA: Insufficient documentation

## 2014-01-12 DIAGNOSIS — K5900 Constipation, unspecified: Secondary | ICD-10-CM

## 2014-01-12 NOTE — Assessment & Plan Note (Addendum)
X-ray 01/11/14 L knee: no acute fracture, malalignment, or lytic destructive lesion. Atherosclerotic vascular calcification at the posterior soft tissue. Continue Tylenol.   

## 2014-01-12 NOTE — Assessment & Plan Note (Signed)
RUE skin tears and hematoma.

## 2014-01-12 NOTE — Assessment & Plan Note (Signed)
Prn MiraLax is adequate.  

## 2014-01-12 NOTE — Progress Notes (Signed)
Patient ID: Carol [name], female   DOB: 10-16-1927, 80 y.o.   MRN: [id number]   Code Status:  DNR MOST  No Known Allergies  Chief Complaint  Patient presents with  . Medical Managment of Chronic Issues  . Acute Visit    s/p fall    HPI: Patient is a 80 y.o. female seen in the SNF at [facility] today for evaluation of s/p fall, the right wrist fx, BLE edema, insomnia, anxiety-fearful to be alone in her room , O2 desaturation, memory loss,  pressure ulcers,  and other chronic medical conditions.   Hospitalized from 11/04/2013 to 11/06/2013. She presented to ED after being found down and unresponsive at her independent living facility. It was estimated that she was potentially on the floor for 24 hours.  Her Acute encephalopathy: Neurological exam is nonfocal. MRI was subsequently found to not be necessary. EEG was within normal limits. Encephalopathy was felt to be from metabolic factors. Patient appears back to her baseline. Has palliative consult while in hospital: Irena Reichmann, NP, Palliative care-comfort measures.   Problem List Items Addressed This Visit   Abrasion     RUE skin tears and hematoma.     Generalized anxiety disorder     Poor appetite is improved, no sleep/rest well at night-better that she sleeps from 6-7 pm to 12-1am, she is no lonhrt  fearful to be alone in early am-since Mirtazapine15mg      Left knee pain - Primary     X-ray 01/11/14 L knee: no acute fracture, malalignment, or lytic destructive lesion. Atherosclerotic vascular calcification at the posterior soft tissue. Continue Tylenol.     Memory deficit     SLUMS 1/30 11/17/13--MOST indicated comfort measures only. SNF for care. No safety awareness. Intensive supervision needed.       Unspecified constipation     Prn MiraLax is adequate.         Review of Systems:  Review of Systems  Constitutional: Negative for fever, chills, weight loss, malaise/fatigue and diaphoresis.  HENT: Positive for hearing  loss. Negative for congestion, ear discharge, ear pain, nosebleeds, sore throat and tinnitus.   Eyes: Negative for blurred vision, double vision, photophobia, pain, discharge and redness.  Respiratory: Positive for cough. Negative for hemoptysis, sputum production, shortness of breath, wheezing and stridor.   Cardiovascular: Positive for leg swelling. Negative for chest pain, palpitations, orthopnea, claudication and PND.       Trace edema BLE  Gastrointestinal: Negative for heartburn, nausea, vomiting, abdominal pain, diarrhea, constipation, blood in stool and melena.  Genitourinary: Negative for dysuria, urgency, frequency, hematuria and flank pain.  Musculoskeletal: Positive for falls and joint pain. Negative for back pain, myalgias and neck pain.       Tight right wrist/hand swelling, warmth, tenderness-distal radial metaphysis fx.   Skin: Negative for itching and rash.        Soft tissue injury the right lower abd and suprapubic area noted  developing into pressure ulcer. RUE skin tears and hematoma from fall 01/08/14  Neurological: Negative for dizziness, tingling, tremors, sensory change, speech change, focal weakness, seizures, loss of consciousness, weakness and headaches.  Endo/Heme/Allergies: Negative for environmental allergies and polydipsia. Does not bruise/bleed easily.  Psychiatric/Behavioral: Positive for memory loss. Negative for depression, suicidal ideas, hallucinations and substance abuse. The patient is nervous/anxious and has insomnia.        Sleeps better at night. Less fearful to be left alone.      Past Medical History  Diagnosis Date  .  AK (actinic keratosis)     right tip of nose and right posterior leg  . Cancer 08/07/12    squamous cell ca,keratoacanthoma-right posterior leg  . Skin cancer 06/2011    scc right elbow tx included excision  . Radiation 09/10/2012-10/24/2012    30 fractions to right posterior calf  . Thyroid disease   . Anemia   . Hypertension   .  Varicose vein of leg   . Hemorrhoids   . Spastic colon   . Insomnia, unspecified   . Unspecified urinary incontinence   . Hypoxemia   . Unspecified hypothyroidism   . Abnormality of gait   . Arthritis   . Osteoarthrosis, unspecified whether generalized or localized, unspecified site   . Radiation 09/10/12-10/24/12    Squamous cell right posterior calf 6000 cGy  . Traumatic closed nondisp torus fracture of distal radial metaphysis 12/08/13   Past Surgical History  Procedure Laterality Date  . Abdominal hysterectomy      early 40's, abdominal  . Appendectomy    . Anterior heel repair    . Total hip arthroplasty  07/31/2010    right  . Pheochromocytoma      left  . Fracture surgery  2003    left   . Wrist fracture surgery  2003    left Dr. Newman Nip   Social History:   reports that she quit smoking about 35 years ago. Her smoking use included Cigarettes. She has a 5 pack-year smoking history. She has never used smokeless tobacco. She reports that she drinks about 1.8 ounces of alcohol per week. She reports that she does not use illicit drugs.  Family History  Problem Relation Age of Onset  . Stroke Mother   . Stroke Father     Medications: Patient's Medications  New Prescriptions   No medications on file  Previous Medications   ACETAMINOPHEN (TYLENOL) 325 MG TABLET    Take 2 tablets (650 mg total) by mouth every 4 (four) hours as needed for mild pain (temperature >/= 99.5 F).   ASPIRIN 81 MG TABLET    Take 1 tablet (81 mg total) by mouth daily.   BETA CAROTENE W/MINERALS (OCUVITE) TABLET    Take 1 tablet by mouth daily. For eyes   FEEDING SUPPLEMENT, ENSURE COMPLETE, (ENSURE COMPLETE) LIQD    Take 237 mLs by mouth 2 (two) times daily between meals.   LORAZEPAM (ATIVAN) 1 MG TABLET    Take 1 mg by mouth every 8 (eight) hours as needed for anxiety.   MIRTAZAPINE (REMERON) 15 MG TABLET    Take 15 mg by mouth at bedtime.   OMEGA-3 FATTY ACIDS (FISH OIL) 1000 MG CAPS    Take  by mouth. Take one capsule a day   POLYETHYLENE GLYCOL (MIRALAX / GLYCOLAX) PACKET    Take 17 g by mouth daily as needed.   TRIAMCINOLONE CREAM (KENALOG) 0.1 %    Apply 1 application topically 2 (two) times daily.  Modified Medications   No medications on file  Discontinued Medications   No medications on file     Physical Exam: Physical Exam  Constitutional: She is oriented to person, place, and time. She appears well-developed and well-nourished. No distress.  HENT:  Head: Normocephalic and atraumatic.  Right Ear: External ear normal.  Left Ear: External ear normal.  Nose: Nose normal.  Mouth/Throat: Oropharynx is clear and moist. No oropharyngeal exudate.  Eyes: Conjunctivae and EOM are normal. Pupils are equal, round, and reactive to light.  Right eye exhibits no discharge. Left eye exhibits no discharge. No scleral icterus.  Neck: Normal range of motion. Neck supple. No JVD present. No tracheal deviation present. No thyromegaly present.  Cardiovascular: Normal rate, regular rhythm, normal heart sounds and intact distal pulses.  Exam reveals no gallop and no friction rub.   No murmur heard. Pulmonary/Chest: Effort normal and breath sounds normal. No stridor. No respiratory distress. She has no wheezes. She has no rales. She exhibits no tenderness.  Abdominal: Soft. Bowel sounds are normal. She exhibits no distension and no mass. There is no tenderness. There is no rebound and no guarding.  Musculoskeletal: Normal range of motion. She exhibits edema. She exhibits no tenderness.  Trace edema BLE  Lymphadenopathy:    She has no cervical adenopathy.  Neurological: She is alert and oriented to person, place, and time. She has normal reflexes. No cranial nerve deficit. She exhibits normal muscle tone. Coordination normal.  Skin: No rash noted. She is not diaphoretic. There is erythema. No pallor.  Abrasion left knee and left great toe-healed. Soft tissue injury the right lower abd and  suprapubic area  developing into pressure ulcer-stage II 4x4cm, 0.5cm in depth with yellow slough covered wound bed. Skin tear and hematoma RUE and c/o left knee pain sustained from fall 01/08/14  Psychiatric: She has a normal mood and affect. Her behavior is normal. Judgment and thought content normal. Cognition and memory are impaired. She exhibits abnormal recent memory. She exhibits normal remote memory.  Rather pleasant confused.    (type .physexam) Filed Vitals:   01/12/14 1231  BP: 140/90  Pulse: 86  Temp: 97.6 F (36.4 C)  TempSrc: Tympanic  Resp: 18      Labs reviewed: Basic Metabolic Panel:  Recent Labs  11/04/13 1814 11/05/13 0400 11/12/13 11/21/13 12/09/13  NA 130* 130* 144 142 141  K 3.6* 3.5* 4.2 4.6 4.0  CL 85* 91*  --   --   --   CO2 27 26  --   --   --   GLUCOSE 101* 93  --   --   --   BUN 31* 45* 22* 19 23*  CREATININE 0.65 1.04 0.4* 0.6 0.6  CALCIUM 9.3 8.6  --   --   --   MG 2.0  --   --   --   --   TSH  --   --  2.24  --   --    Liver Function Tests:  Recent Labs  11/04/13 1814 11/05/13 0400 11/12/13 11/21/13  AST 185* 168* 40* 19  ALT 32 29 20 10   ALKPHOS 83 63 53 74  BILITOT 0.9 0.4  --   --   PROT 7.5 6.1  --   --   ALBUMIN 3.8 3.1*  --   --    CBC:  Recent Labs  11/04/13 1814 11/05/13 0400 11/12/13 11/21/13 12/09/13  WBC 6.6 6.6 7.3 7.7 7.7  NEUTROABS 5.6 5.3  --   --   --   HGB 17.8* 15.0 14.1 12.2 11.0*  HCT 49.8* 42.7 41 36 33*  MCV 92.7 93.4  --   --   --   PLT 159 164 226 236 267   Past Procedures:  11/04/2013 CLINICAL DATA: Fall, weakness EXAM: CHEST - 1 VIEW COMPARISON: IMPRESSION: Stable hyperinflation. No acute process   11/04/2013 CLINICAL DATA: Fall EXAM: CT HEAD WITHOUT CONTRAST CT MAXILLOFACIAL WITHOUT CONTRAST CT CERVICAL SPINE WITHOUT CONTRAST: IMPRESSION: Mild soft tissue swelling overlying  the right frontal bone/orbit. No evidence of acute intracranial abnormality. Atrophy with small vessel ischemic changes and  intracranial atherosclerosis. No evidence of maxillofacial fracture. No evidence of traumatic injury to the cervical spine. Moderate multilevel degenerative changes.   11/05/13: EEG: IMPRESSION: This is a normal awake EEG  11/15/13 CXR no radiographic evidence of acute pulmonary disease.  12/08/13 X-ray R wrist: a transverse nondisplaced possible incomplete fracture of the distal radial metaphysis  12/08/13 CXR borderline to slightly elevated pulmonary vasculature. The lungs are clear.   12/08/13 X-ray R hand: severe osteopenia, moderate to severe degenerative arthritic changes interphalangeal joints of the fingers and thumb.   01/11/14 X-ray L knee: moderate diffuse osteopenia, no acute fracture, malalignment, or lytic destructive lesion, no knee joint effusion, atherosclerotic vascular calcifications at the posterior soft tissues, minimal osteoarthritis, degenerative calcification at the knee joint involving the menisci.   Assessment/Plan Left knee pain X-ray 01/11/14 L knee: no acute fracture, malalignment, or lytic destructive lesion. Atherosclerotic vascular calcification at the posterior soft tissue. Continue Tylenol.   Abrasion RUE skin tears and hematoma.   Generalized anxiety disorder Poor appetite is improved, no sleep/rest well at night-better that she sleeps from 6-7 pm to 12-1am, she is no lonhrt  fearful to be alone in early am-since Mirtazapine15mg    Unspecified constipation Prn MiraLax is adequate.    Memory deficit SLUMS 1/30 11/17/13--MOST indicated comfort measures only. SNF for care. No safety awareness. Intensive supervision needed.       Family/ Staff Communication: observe the patient.   Goals of Care: SNF  Labs/tests ordered: none

## 2014-01-12 NOTE — Assessment & Plan Note (Signed)
SLUMS 1/30 11/17/13--MOST indicated comfort measures only. SNF for care. No safety awareness. Intensive supervision needed.

## 2014-01-12 NOTE — Assessment & Plan Note (Signed)
Poor appetite is improved, no sleep/rest well at night-better that she sleeps from 6-7 pm to 12-1am, she is no lonhrt  fearful to be alone in early am-since Mirtazapine15mg 

## 2014-02-09 ENCOUNTER — Encounter: Payer: Self-pay | Admitting: *Deleted

## 2014-02-16 ENCOUNTER — Encounter: Payer: Self-pay | Admitting: Nurse Practitioner

## 2014-02-16 ENCOUNTER — Non-Acute Institutional Stay (SKILLED_NURSING_FACILITY): Payer: Medicare Other | Admitting: Nurse Practitioner

## 2014-02-16 DIAGNOSIS — M25562 Pain in left knee: Secondary | ICD-10-CM

## 2014-02-16 DIAGNOSIS — R269 Unspecified abnormalities of gait and mobility: Secondary | ICD-10-CM

## 2014-02-16 DIAGNOSIS — S52529A Torus fracture of lower end of unspecified radius, initial encounter for closed fracture: Secondary | ICD-10-CM

## 2014-02-16 DIAGNOSIS — E039 Hypothyroidism, unspecified: Secondary | ICD-10-CM

## 2014-02-16 DIAGNOSIS — D638 Anemia in other chronic diseases classified elsewhere: Secondary | ICD-10-CM

## 2014-02-16 DIAGNOSIS — R0902 Hypoxemia: Secondary | ICD-10-CM

## 2014-02-16 DIAGNOSIS — G4700 Insomnia, unspecified: Secondary | ICD-10-CM

## 2014-02-16 DIAGNOSIS — M25569 Pain in unspecified knee: Secondary | ICD-10-CM

## 2014-02-16 DIAGNOSIS — IMO0002 Reserved for concepts with insufficient information to code with codable children: Secondary | ICD-10-CM

## 2014-02-16 DIAGNOSIS — F411 Generalized anxiety disorder: Secondary | ICD-10-CM

## 2014-02-16 DIAGNOSIS — K5900 Constipation, unspecified: Secondary | ICD-10-CM

## 2014-02-16 NOTE — Assessment & Plan Note (Signed)
Mild, Hgb 11.0, MCV 95.1 and MCH 31.4 12/09/13 wiill continue to monitor the patient. Continue dietary supplement.

## 2014-02-16 NOTE — Progress Notes (Signed)
Patient ID: Carol [name], female   DOB: June 21, 1928, 80 y.o.   MRN: [id number]   Code Status:  DNR MOST  No Known Allergies  Chief Complaint  Patient presents with  . Medical Management of Chronic Issues    HPI: Patient is a 80 y.o. female seen in the SNF at [facility] today for evaluation of chronic medical conditions.     Problem List Items Addressed This Visit   Abnormality of gait     Continue to ambulates with walker with SBA    Anemia of chronic disease     Mild, Hgb 11.0, MCV 95.1 and MCH 31.4 12/09/13 wiill continue to monitor the patient. Continue dietary supplement.      Generalized anxiety disorder     No c/o fearfulness. Eats and sleeps better. Continue  Mirtazapine15mg       Hypoxemia     Resolved.O2 Sat 98% RA    Insomnia, unspecified     Sleeps better with Mirtazapine 15mg  qhs. No longer needs Lorazepam.     Left knee pain - Primary     X-ray 01/11/14 L knee: no acute fracture, malalignment, or lytic destructive lesion. Atherosclerotic vascular calcification at the posterior soft tissue. Continue Tylenol.      Traumatic closed nondisp torus fracture of distal radial metaphysis     Healed.     Unspecified constipation     Prn MiraLax is adequate.       Unspecified hypothyroidism     TSH 2.238 11/12/13, not taking thyroid supplement.           Review of Systems:  Review of Systems  Constitutional: Negative for fever, chills, weight loss, malaise/fatigue and diaphoresis.  HENT: Positive for hearing loss. Negative for congestion, ear discharge, ear pain, nosebleeds, sore throat and tinnitus.   Eyes: Negative for blurred vision, double vision, photophobia, pain, discharge and redness.  Respiratory: Negative for cough, hemoptysis, sputum production, shortness of breath, wheezing and stridor.   Cardiovascular: Positive for leg swelling. Negative for chest pain, palpitations, orthopnea, claudication and PND.       Trace edema BLE  Gastrointestinal:  Negative for heartburn, nausea, vomiting, abdominal pain, diarrhea, constipation, blood in stool and melena.  Genitourinary: Negative for dysuria, urgency, frequency, hematuria and flank pain.  Musculoskeletal: Positive for falls and joint pain. Negative for back pain, myalgias and neck pain.       Tight right wrist/hand swelling, warmth, tenderness-distal radial metaphysis fx-healed. Chronic left knee pain   Skin: Negative for itching and rash.        Soft tissue injury the right lower abd and suprapubic area noted  developing into pressure ulcer-healed.   Neurological: Negative for dizziness, tingling, tremors, sensory change, speech change, focal weakness, seizures, loss of consciousness, weakness and headaches.  Endo/Heme/Allergies: Negative for environmental allergies and polydipsia. Does not bruise/bleed easily.  Psychiatric/Behavioral: Positive for memory loss. Negative for depression, suicidal ideas, hallucinations and substance abuse. The patient is nervous/anxious and has insomnia.        Sleeps better at night. Less fearful to be left alone.      Past Medical History  Diagnosis Date  . AK (actinic keratosis)     right tip of nose and right posterior leg  . Cancer 08/07/12    squamous cell ca,keratoacanthoma-right posterior leg  . Skin cancer 06/2011    scc right elbow tx included excision  . Radiation 09/10/2012-10/24/2012    30 fractions to right posterior calf  . Thyroid disease   .  Anemia   . Hypertension   . Varicose vein of leg   . Hemorrhoids   . Spastic colon   . Insomnia, unspecified   . Unspecified urinary incontinence   . Hypoxemia   . Unspecified hypothyroidism   . Abnormality of gait   . Arthritis   . Osteoarthrosis, unspecified whether generalized or localized, unspecified site   . Radiation 09/10/12-10/24/12    Squamous cell right posterior calf 6000 cGy  . Traumatic closed nondisp torus fracture of distal radial metaphysis 12/08/13   Past Surgical History    Procedure Laterality Date  . Abdominal hysterectomy      early 40's, abdominal  . Appendectomy    . Anterior heel repair    . Total hip arthroplasty  07/31/2010    right  . Pheochromocytoma      left  . Fracture surgery  2003    left   . Wrist fracture surgery  2003    left Dr. Newman Nip   Social History:   reports that she quit smoking about 35 years ago. Her smoking use included Cigarettes. She has a 5 pack-year smoking history. She has never used smokeless tobacco. She reports that she drinks about 1.8 ounces of alcohol per week. She reports that she does not use illicit drugs.  Family History  Problem Relation Age of Onset  . Stroke Mother   . Stroke Father     Medications: Patient's Medications  New Prescriptions   No medications on file  Previous Medications   ACETAMINOPHEN (TYLENOL) 325 MG TABLET    Take 2 tablets (650 mg total) by mouth every 4 (four) hours as needed for mild pain (temperature >/= 99.5 F).   ASPIRIN 81 MG TABLET    Take 1 tablet (81 mg total) by mouth daily.   BETA CAROTENE W/MINERALS (OCUVITE) TABLET    Take 1 tablet by mouth daily. For eyes   FEEDING SUPPLEMENT, ENSURE COMPLETE, (ENSURE COMPLETE) LIQD    Take 237 mLs by mouth 2 (two) times daily between meals.   MIRTAZAPINE (REMERON) 15 MG TABLET    Take 15 mg by mouth at bedtime.   OMEGA-3 FATTY ACIDS (FISH OIL) 1000 MG CAPS    Take by mouth. Take one capsule a day   POLYETHYLENE GLYCOL (MIRALAX / GLYCOLAX) PACKET    Take 17 g by mouth daily as needed.   TRIAMCINOLONE CREAM (KENALOG) 0.1 %    Apply 1 application topically 2 (two) times daily.  Modified Medications   No medications on file  Discontinued Medications   No medications on file     Physical Exam: Physical Exam  Constitutional: She is oriented to person, place, and time. She appears well-developed and well-nourished. No distress.  HENT:  Head: Normocephalic and atraumatic.  Right Ear: External ear normal.  Left Ear: External  ear normal.  Nose: Nose normal.  Mouth/Throat: Oropharynx is clear and moist. No oropharyngeal exudate.  Eyes: Conjunctivae and EOM are normal. Pupils are equal, round, and reactive to light. Right eye exhibits no discharge. Left eye exhibits no discharge. No scleral icterus.  Neck: Normal range of motion. Neck supple. No JVD present. No tracheal deviation present. No thyromegaly present.  Cardiovascular: Normal rate, regular rhythm, normal heart sounds and intact distal pulses.  Exam reveals no gallop and no friction rub.   No murmur heard. Pulmonary/Chest: Effort normal. No stridor. No respiratory distress. She has no wheezes. She has rales. She exhibits no tenderness.  Bibasilar dry rales.  Abdominal: Soft. Bowel sounds are normal. She exhibits no distension and no mass. There is no tenderness. There is no rebound and no guarding.  Musculoskeletal: Normal range of motion. She exhibits edema. She exhibits no tenderness.  Trace edema BLE. Slightly decreased ROM of the right wrist from previous fx. Chronic left knee pain with ambulation.   Lymphadenopathy:    She has no cervical adenopathy.  Neurological: She is alert and oriented to person, place, and time. She has normal reflexes. No cranial nerve deficit. She exhibits normal muscle tone. Coordination normal.  Skin: No rash noted. She is not diaphoretic. No erythema. No pallor.  Abrasion left knee and left great toe-healed. Soft tissue injury the right lower abd and suprapubic area  developing into pressure ulcer-stage II 4x4cm, 0.5cm in depth with yellow slough covered wound bed-healed.   Psychiatric: She has a normal mood and affect. Her behavior is normal. Judgment and thought content normal. Cognition and memory are impaired. She exhibits abnormal recent memory. She exhibits normal remote memory.  Rather pleasant confused.    (type .physexam) Filed Vitals:   02/16/14 1327  BP: 134/76  Pulse: 82  Temp: 97.3 F (36.3 C)  TempSrc:  Tympanic  Resp: 18  SpO2: 98%      Labs reviewed: Basic Metabolic Panel:  Recent Labs  11/04/13 1814 11/05/13 0400 11/12/13 11/21/13 12/09/13  NA 130* 130* 144 142 141  K 3.6* 3.5* 4.2 4.6 4.0  CL 85* 91*  --   --   --   CO2 27 26  --   --   --   GLUCOSE 101* 93  --   --   --   BUN 31* 45* 22* 19 23*  CREATININE 0.65 1.04 0.4* 0.6 0.6  CALCIUM 9.3 8.6  --   --   --   MG 2.0  --   --   --   --   TSH  --   --  2.24  --   --    Liver Function Tests:  Recent Labs  11/04/13 1814 11/05/13 0400 11/12/13 11/21/13  AST 185* 168* 40* 19  ALT 32 29 20 10   ALKPHOS 83 63 53 74  BILITOT 0.9 0.4  --   --   PROT 7.5 6.1  --   --   ALBUMIN 3.8 3.1*  --   --    CBC:  Recent Labs  11/04/13 1814 11/05/13 0400 11/12/13 11/21/13 12/09/13  WBC 6.6 6.6 7.3 7.7 7.7  NEUTROABS 5.6 5.3  --   --   --   HGB 17.8* 15.0 14.1 12.2 11.0*  HCT 49.8* 42.7 41 36 33*  MCV 92.7 93.4  --   --   --   PLT 159 164 226 236 267   Past Procedures:  11/04/2013 CLINICAL DATA: Fall, weakness EXAM: CHEST - 1 VIEW COMPARISON: IMPRESSION: Stable hyperinflation. No acute process   11/04/2013 CLINICAL DATA: Fall EXAM: CT HEAD WITHOUT CONTRAST CT MAXILLOFACIAL WITHOUT CONTRAST CT CERVICAL SPINE WITHOUT CONTRAST: IMPRESSION: Mild soft tissue swelling overlying the right frontal bone/orbit. No evidence of acute intracranial abnormality. Atrophy with small vessel ischemic changes and intracranial atherosclerosis. No evidence of maxillofacial fracture. No evidence of traumatic injury to the cervical spine. Moderate multilevel degenerative changes.   11/05/13: EEG: IMPRESSION: This is a normal awake EEG  11/15/13 CXR no radiographic evidence of acute pulmonary disease.  12/08/13 X-ray R wrist: a transverse nondisplaced possible incomplete fracture of the distal radial metaphysis  12/08/13 CXR  borderline to slightly elevated pulmonary vasculature. The lungs are clear.   12/08/13 X-ray R hand: severe osteopenia, moderate  to severe degenerative arthritic changes interphalangeal joints of the fingers and thumb.   01/11/14 X-ray L knee: moderate diffuse osteopenia, no acute fracture, malalignment, or lytic destructive lesion, no knee joint effusion, atherosclerotic vascular calcifications at the posterior soft tissues, minimal osteoarthritis, degenerative calcification at the knee joint involving the menisci.   Assessment/Plan Abnormality of gait Continue to ambulates with walker with SBA  Anemia of chronic disease Mild, Hgb 11.0, MCV 95.1 and MCH 31.4 12/09/13 wiill continue to monitor the patient. Continue dietary supplement.    Generalized anxiety disorder No c/o fearfulness. Eats and sleeps better. Continue  Mirtazapine15mg     Insomnia, unspecified Sleeps better with Mirtazapine 15mg  qhs. No longer needs Lorazepam.   Unspecified constipation Prn MiraLax is adequate.     Unspecified hypothyroidism TSH 2.238 11/12/13, not taking thyroid supplement.      Traumatic closed nondisp torus fracture of distal radial metaphysis Healed.   Hypoxemia Resolved.O2 Sat 98% RA  Left knee pain X-ray 01/11/14 L knee: no acute fracture, malalignment, or lytic destructive lesion. Atherosclerotic vascular calcification at the posterior soft tissue. Continue Tylenol.      Family/ Staff Communication: observe the patient.   Goals of Care: SNF  Labs/tests ordered: none

## 2014-02-16 NOTE — Assessment & Plan Note (Signed)
Continue to ambulates with walker with SBA

## 2014-02-16 NOTE — Assessment & Plan Note (Signed)
TSH 2.238 11/12/13, not taking thyroid supplement.    

## 2014-02-16 NOTE — Assessment & Plan Note (Signed)
Healed

## 2014-02-16 NOTE — Assessment & Plan Note (Signed)
Resolved.O2 Sat 98% RA

## 2014-02-16 NOTE — Assessment & Plan Note (Signed)
Prn MiraLax is adequate.  

## 2014-02-16 NOTE — Assessment & Plan Note (Signed)
No c/o fearfulness. Eats and sleeps better. Continue  Mirtazapine15mg   

## 2014-02-16 NOTE — Assessment & Plan Note (Signed)
Sleeps better with Mirtazapine 15mg qhs. No longer needs Lorazepam.    

## 2014-02-16 NOTE — Assessment & Plan Note (Signed)
X-ray 01/11/14 L knee: no acute fracture, malalignment, or lytic destructive lesion. Atherosclerotic vascular calcification at the posterior soft tissue. Continue Tylenol.

## 2014-02-19 ENCOUNTER — Encounter: Payer: Self-pay | Admitting: *Deleted

## 2014-03-09 ENCOUNTER — Non-Acute Institutional Stay (SKILLED_NURSING_FACILITY): Payer: Medicare Other | Admitting: Nurse Practitioner

## 2014-03-09 ENCOUNTER — Encounter: Payer: Self-pay | Admitting: Nurse Practitioner

## 2014-03-09 DIAGNOSIS — R269 Unspecified abnormalities of gait and mobility: Secondary | ICD-10-CM

## 2014-03-09 DIAGNOSIS — F411 Generalized anxiety disorder: Secondary | ICD-10-CM

## 2014-03-09 DIAGNOSIS — R0902 Hypoxemia: Secondary | ICD-10-CM

## 2014-03-09 DIAGNOSIS — I10 Essential (primary) hypertension: Secondary | ICD-10-CM

## 2014-03-09 DIAGNOSIS — K5900 Constipation, unspecified: Secondary | ICD-10-CM

## 2014-03-09 DIAGNOSIS — R413 Other amnesia: Secondary | ICD-10-CM

## 2014-03-09 NOTE — Assessment & Plan Note (Signed)
On exertion. Activity as tolerated and O2 as needed.

## 2014-03-09 NOTE — Assessment & Plan Note (Signed)
Controlled. Off med   

## 2014-03-09 NOTE — Progress Notes (Signed)
Patient ID: Carol [name], female   DOB: 1928-09-05, 80 y.o.   MRN: [id number]   Code Status:  DNR MOST  No Known Allergies  Chief Complaint  Patient presents with  . Medical Management of Chronic Issues    HPI: Patient is a 80 y.o. female seen in the [facility] at [facility] today for evaluation of chronic medical conditions.     Problem List Items Addressed This Visit   Abnormality of gait     Improved. Able to ambulates with walker.     Generalized anxiety disorder     No c/o fearfulness. Eats and sleeps better. Continue  Mirtazapine15mg      Hypoxemia - Primary     On exertion. Activity as tolerated and O2 as needed.     Memory deficit     SLUMS 1/30 11/17/13--MOST indicated comfort measures only. No safety awareness. Intensive supervision needed.        Unspecified constipation     Prn MiraLax is adequate.       Unspecified essential hypertension     Controlled. Off med         Review of Systems:  Review of Systems  Constitutional: Negative for fever, chills, weight loss, malaise/fatigue and diaphoresis.  HENT: Positive for hearing loss. Negative for congestion, ear discharge, ear pain, nosebleeds, sore throat and tinnitus.   Eyes: Negative for blurred vision, double vision, photophobia, pain, discharge and redness.  Respiratory: Negative for cough, hemoptysis, sputum production, shortness of breath, wheezing and stridor.        O2 desaturation on exertion.   Cardiovascular: Positive for leg swelling. Negative for chest pain, palpitations, orthopnea, claudication and PND.       Trace edema BLE  Gastrointestinal: Negative for heartburn, nausea, vomiting, abdominal pain, diarrhea, constipation, blood in stool and melena.  Genitourinary: Negative for dysuria, urgency, frequency, hematuria and flank pain.  Musculoskeletal: Positive for falls and joint pain. Negative for back pain, myalgias and neck pain.       Tight right wrist/hand swelling, warmth,  tenderness-distal radial metaphysis fx-healed. Chronic left knee pain   Skin: Negative for itching and rash.        Soft tissue injury the right lower abd and suprapubic area noted  developing into pressure ulcer-healed.   Neurological: Negative for dizziness, tingling, tremors, sensory change, speech change, focal weakness, seizures, loss of consciousness, weakness and headaches.  Endo/Heme/Allergies: Negative for environmental allergies and polydipsia. Does not bruise/bleed easily.  Psychiatric/Behavioral: Positive for memory loss. Negative for depression, suicidal ideas, hallucinations and substance abuse. The patient is nervous/anxious and has insomnia.        Sleeps better at night. Less fearful to be left alone.      Past Medical History  Diagnosis Date  . AK (actinic keratosis)     right tip of nose and right posterior leg  . Cancer 08/07/12    squamous cell ca,keratoacanthoma-right posterior leg  . Skin cancer 06/2011    scc right elbow tx included excision  . Radiation 09/10/2012-10/24/2012    30 fractions to right posterior calf  . Thyroid disease   . Anemia   . Hypertension   . Varicose vein of leg   . Hemorrhoids   . Spastic colon   . Insomnia, unspecified   . Unspecified urinary incontinence   . Hypoxemia   . Unspecified hypothyroidism   . Abnormality of gait   . Arthritis   . Osteoarthrosis, unspecified whether generalized or localized, unspecified site   .  Radiation 09/10/12-10/24/12    Squamous cell right posterior calf 6000 cGy  . Traumatic closed nondisp torus fracture of distal radial metaphysis 12/08/13   Past Surgical History  Procedure Laterality Date  . Abdominal hysterectomy      early 40's, abdominal  . Appendectomy    . Anterior heel repair    . Total hip arthroplasty  07/31/2010    right  . Pheochromocytoma      left  . Fracture surgery  2003    left   . Wrist fracture surgery  2003    left Dr. Newman Nip   Social History:   reports that  she quit smoking about 35 years ago. Her smoking use included Cigarettes. She has a 5 pack-year smoking history. She has never used smokeless tobacco. She reports that she drinks about 1.8 ounces of alcohol per week. She reports that she does not use illicit drugs.  Family History  Problem Relation Age of Onset  . Stroke Mother   . Stroke Father     Medications: Patient's Medications  New Prescriptions   No medications on file  Previous Medications   ACETAMINOPHEN (TYLENOL) 325 MG TABLET    Take 2 tablets (650 mg total) by mouth every 4 (four) hours as needed for mild pain (temperature >/= 99.5 F).   ASPIRIN 81 MG TABLET    Take 1 tablet (81 mg total) by mouth daily.   MIRTAZAPINE (REMERON) 15 MG TABLET    Take 15 mg by mouth at bedtime.   OMEGA-3 FATTY ACIDS (FISH OIL) 1000 MG CAPS    Take by mouth. Take one capsule a day   POLYETHYLENE GLYCOL (MIRALAX / GLYCOLAX) PACKET    Take 17 g by mouth daily as needed.  Modified Medications   No medications on file  Discontinued Medications   No medications on file     Physical Exam: Physical Exam  Constitutional: She is oriented to person, place, and time. She appears well-developed and well-nourished. No distress.  HENT:  Head: Normocephalic and atraumatic.  Right Ear: External ear normal.  Left Ear: External ear normal.  Nose: Nose normal.  Mouth/Throat: Oropharynx is clear and moist. No oropharyngeal exudate.  Eyes: Conjunctivae and EOM are normal. Pupils are equal, round, and reactive to light. Right eye exhibits no discharge. Left eye exhibits no discharge. No scleral icterus.  Neck: Normal range of motion. Neck supple. No JVD present. No tracheal deviation present. No thyromegaly present.  Cardiovascular: Normal rate, regular rhythm, normal heart sounds and intact distal pulses.  Exam reveals no gallop and no friction rub.   No murmur heard. Pulmonary/Chest: Effort normal. No stridor. No respiratory distress. She has no wheezes.  She has rales. She exhibits no tenderness.  Bibasilar dry rales.   Abdominal: Soft. Bowel sounds are normal. She exhibits no distension and no mass. There is no tenderness. There is no rebound and no guarding.  Musculoskeletal: Normal range of motion. She exhibits edema. She exhibits no tenderness.  Trace edema BLE. Slightly decreased ROM of the right wrist from previous fx. Chronic left knee pain with ambulation.   Lymphadenopathy:    She has no cervical adenopathy.  Neurological: She is alert and oriented to person, place, and time. She has normal reflexes. No cranial nerve deficit. She exhibits normal muscle tone. Coordination normal.  Skin: No rash noted. She is not diaphoretic. No erythema. No pallor.  Abrasion left knee and left great toe-healed. Soft tissue injury the right lower abd and suprapubic  area  developing into pressure ulcer-stage II 4x4cm, 0.5cm in depth with yellow slough covered wound bed-healed.   Psychiatric: She has a normal mood and affect. Her behavior is normal. Judgment and thought content normal. Cognition and memory are impaired. She exhibits abnormal recent memory. She exhibits normal remote memory.  Rather pleasant confused.    (type .physexam) Filed Vitals:   03/09/14 1335  BP: 120/56  Pulse: 98  Temp: 99.6 F (37.6 C)  TempSrc: Tympanic  Resp: 20      Labs reviewed: Basic Metabolic Panel:  Recent Labs  11/04/13 1814 11/05/13 0400 11/12/13 11/21/13 12/09/13  NA 130* 130* 144 142 141  K 3.6* 3.5* 4.2 4.6 4.0  CL 85* 91*  --   --   --   CO2 27 26  --   --   --   GLUCOSE 101* 93  --   --   --   BUN 31* 45* 22* 19 23*  CREATININE 0.65 1.04 0.4* 0.6 0.6  CALCIUM 9.3 8.6  --   --   --   MG 2.0  --   --   --   --   TSH  --   --  2.24  --   --    Liver Function Tests:  Recent Labs  11/04/13 1814 11/05/13 0400 11/12/13 11/21/13  AST 185* 168* 40* 19  ALT 32 29 20 10   ALKPHOS 83 63 53 74  BILITOT 0.9 0.4  --   --   PROT 7.5 6.1  --   --     ALBUMIN 3.8 3.1*  --   --    CBC:  Recent Labs  11/04/13 1814 11/05/13 0400 11/12/13 11/21/13 12/09/13  WBC 6.6 6.6 7.3 7.7 7.7  NEUTROABS 5.6 5.3  --   --   --   HGB 17.8* 15.0 14.1 12.2 11.0*  HCT 49.8* 42.7 41 36 33*  MCV 92.7 93.4  --   --   --   PLT 159 164 226 236 267   Past Procedures:  11/04/2013 CLINICAL DATA: Fall, weakness EXAM: CHEST - 1 VIEW COMPARISON: IMPRESSION: Stable hyperinflation. No acute process   11/04/2013 CLINICAL DATA: Fall EXAM: CT HEAD WITHOUT CONTRAST CT MAXILLOFACIAL WITHOUT CONTRAST CT CERVICAL SPINE WITHOUT CONTRAST: IMPRESSION: Mild soft tissue swelling overlying the right frontal bone/orbit. No evidence of acute intracranial abnormality. Atrophy with small vessel ischemic changes and intracranial atherosclerosis. No evidence of maxillofacial fracture. No evidence of traumatic injury to the cervical spine. Moderate multilevel degenerative changes.   11/05/13: EEG: IMPRESSION: This is a normal awake EEG  11/15/13 CXR no radiographic evidence of acute pulmonary disease.  12/08/13 X-ray R wrist: a transverse nondisplaced possible incomplete fracture of the distal radial metaphysis  12/08/13 CXR borderline to slightly elevated pulmonary vasculature. The lungs are clear.   12/08/13 X-ray R hand: severe osteopenia, moderate to severe degenerative arthritic changes interphalangeal joints of the fingers and thumb.   01/11/14 X-ray L knee: moderate diffuse osteopenia, no acute fracture, malalignment, or lytic destructive lesion, no knee joint effusion, atherosclerotic vascular calcifications at the posterior soft tissues, minimal osteoarthritis, degenerative calcification at the knee joint involving the menisci.   Assessment/Plan Unspecified essential hypertension Controlled. Off med    Unspecified constipation Prn MiraLax is adequate.     Abnormality of gait Improved. Able to ambulates with walker.   Generalized anxiety disorder No c/o fearfulness. Eats  and sleeps better. Continue  Mirtazapine15mg    Memory deficit SLUMS 1/30 11/17/13--MOST indicated comfort  measures only. No safety awareness. Intensive supervision needed.      Hypoxemia On exertion. Activity as tolerated and O2 as needed.     Family/ Staff Communication: observe the patient.   Goals of Care: AL  Labs/tests ordered: none

## 2014-03-09 NOTE — Assessment & Plan Note (Signed)
SLUMS 1/30 11/17/13--MOST indicated comfort measures only. No safety awareness. Intensive supervision needed.

## 2014-03-09 NOTE — Assessment & Plan Note (Signed)
Improved. Able to ambulates with walker.

## 2014-03-09 NOTE — Assessment & Plan Note (Signed)
No c/o fearfulness. Eats and sleeps better. Continue  Mirtazapine15mg   

## 2014-03-09 NOTE — Assessment & Plan Note (Signed)
Prn MiraLax is adequate.  

## 2014-05-03 LAB — CBC AND DIFFERENTIAL
HEMATOCRIT: 39 % (ref 36–46)
Hemoglobin: 13.4 g/dL (ref 12.0–16.0)
Platelets: 207 10*3/uL (ref 150–399)
WBC: 5.8 10^3/mL

## 2014-05-03 LAB — HEPATIC FUNCTION PANEL
ALT: 8 U/L (ref 7–35)
AST: 18 U/L (ref 13–35)
Alkaline Phosphatase: 79 U/L (ref 25–125)
BILIRUBIN, TOTAL: 0.5 mg/dL

## 2014-05-03 LAB — BASIC METABOLIC PANEL
BUN: 23 mg/dL — AB (ref 4–21)
Creatinine: 0.6 mg/dL (ref 0.5–1.1)
Glucose: 156 mg/dL
Potassium: 4.9 mmol/L (ref 3.4–5.3)
SODIUM: 139 mmol/L (ref 137–147)

## 2014-05-04 ENCOUNTER — Encounter: Payer: Self-pay | Admitting: Nurse Practitioner

## 2014-05-04 ENCOUNTER — Non-Acute Institutional Stay: Payer: Medicare Other | Admitting: Nurse Practitioner

## 2014-05-04 DIAGNOSIS — K5900 Constipation, unspecified: Secondary | ICD-10-CM

## 2014-05-04 DIAGNOSIS — R0902 Hypoxemia: Secondary | ICD-10-CM

## 2014-05-04 DIAGNOSIS — R413 Other amnesia: Secondary | ICD-10-CM

## 2014-05-04 DIAGNOSIS — G4700 Insomnia, unspecified: Secondary | ICD-10-CM

## 2014-05-04 NOTE — Assessment & Plan Note (Signed)
Prn MiraLax is adequate.  

## 2014-05-04 NOTE — Assessment & Plan Note (Signed)
Sleeps better with Mirtazapine 15mg qhs. No longer needs Lorazepam.    

## 2014-05-04 NOTE — Assessment & Plan Note (Signed)
Resolved.O2 Sat 98% RA. 05/03/14 CXR mild pulmonary venous hypertension w/o other evident of cardiac decompensation findings. No other active cardiopulmonary disease. CBC and CMP 05/03/14 unremarkable. UA pending.

## 2014-05-04 NOTE — Progress Notes (Signed)
Patient ID: Carol [name], female   DOB: [birth year], 80 y.o.   MRN: [id number]   Code Status:  DNR MOST  No Known Allergies  Chief Complaint  Patient presents with  . Medical Management of Chronic Issues  . Acute Visit    hypoxemia, emotional outburst.     HPI: Patient is a 80 y.o. female seen in the AL at [facility] today for evaluation of resolved hypoxemia, on and off emotional outburst,  and chronic medical conditions.     Problem List Items Addressed This Visit   Unspecified constipation - Primary     Prn MiraLax is adequate.        Insomnia, unspecified     Sleeps better with Mirtazapine 15mg  qhs. No longer needs Lorazepam.      Hypoxemia     Resolved.O2 Sat 98% RA. 05/03/14 CXR mild pulmonary venous hypertension w/o other evident of cardiac decompensation findings. No other active cardiopulmonary disease. CBC and CMP 05/03/14 unremarkable. UA pending.       Memory deficit     Emotional out burst on and off w/o focal neurological deficits noted. 05/03/14 CXR mild pulmonary venous hypertension w/o other evident of cardiac decompensation findings. No other active cardiopulmonary disease. CBC and CMP 05/03/14 unremarkable. UA pending. Continue to observe the patient. May try Nicotine patch if UTI is ruled out.        Review of Systems:  Review of Systems  Constitutional: Negative for fever, chills, weight loss, malaise/fatigue and diaphoresis.  HENT: Positive for hearing loss. Negative for congestion, ear discharge, ear pain, nosebleeds, sore throat and tinnitus.   Eyes: Negative for blurred vision, double vision, photophobia, pain, discharge and redness.  Respiratory: Negative for cough, hemoptysis, sputum production, shortness of breath, wheezing and stridor.        O2 desaturation on exertion.   Cardiovascular: Positive for leg swelling. Negative for chest pain, palpitations, orthopnea, claudication and PND.       Trace edema BLE  Gastrointestinal: Negative for  heartburn, nausea, vomiting, abdominal pain, diarrhea, constipation, blood in stool and melena.  Genitourinary: Positive for frequency. Negative for dysuria, urgency, hematuria and flank pain.       Chronic  Musculoskeletal: Positive for falls and joint pain. Negative for back pain, myalgias and neck pain.       Tight right wrist/hand swelling, warmth, tenderness-distal radial metaphysis fx-healed. Chronic left knee pain   Skin: Negative for itching and rash.        Soft tissue injury the right lower abd and suprapubic area noted  developing into pressure ulcer-healed.   Neurological: Negative for dizziness, tingling, tremors, sensory change, speech change, focal weakness, seizures, loss of consciousness, weakness and headaches.  Endo/Heme/Allergies: Negative for environmental allergies and polydipsia. Does not bruise/bleed easily.  Psychiatric/Behavioral: Positive for memory loss. Negative for depression, suicidal ideas, hallucinations and substance abuse. The patient is nervous/anxious and has insomnia.        Sleeps better at night. Less fearful to be left alone.      Past Medical History  Diagnosis Date  . AK (actinic keratosis)     right tip of nose and right posterior leg  . Cancer 08/07/12    squamous cell ca,keratoacanthoma-right posterior leg  . Skin cancer 06/2011    scc right elbow tx included excision  . Radiation 09/10/2012-10/24/2012    30 fractions to right posterior calf  . Thyroid disease   . Anemia   . Hypertension   . Varicose vein of  leg   . Hemorrhoids   . Spastic colon   . Insomnia, unspecified   . Unspecified urinary incontinence   . Hypoxemia   . Unspecified hypothyroidism   . Abnormality of gait   . Arthritis   . Osteoarthrosis, unspecified whether generalized or localized, unspecified site   . Radiation 09/10/12-10/24/12    Squamous cell right posterior calf 6000 cGy  . Traumatic closed nondisp torus fracture of distal radial metaphysis 12/08/13   Past  Surgical History  Procedure Laterality Date  . Abdominal hysterectomy      early 40's, abdominal  . Appendectomy    . Anterior heel repair    . Total hip arthroplasty  07/31/2010    right  . Pheochromocytoma      left  . Fracture surgery  2003    left   . Wrist fracture surgery  2003    left Dr. Newman Nip   Social History:   reports that she quit smoking about 35 years ago. Her smoking use included Cigarettes. She has a 5 pack-year smoking history. She has never used smokeless tobacco. She reports that she drinks about 1.8 ounces of alcohol per week. She reports that she does not use illicit drugs.  Family History  Problem Relation Age of Onset  . Stroke Mother   . Stroke Father     Medications: Patient's Medications  New Prescriptions   No medications on file  Previous Medications   ACETAMINOPHEN (TYLENOL) 325 MG TABLET    Take 2 tablets (650 mg total) by mouth every 4 (four) hours as needed for mild pain (temperature >/= 99.5 F).   ASPIRIN 81 MG TABLET    Take 1 tablet (81 mg total) by mouth daily.   MIRTAZAPINE (REMERON) 15 MG TABLET    Take 15 mg by mouth at bedtime.   OMEGA-3 FATTY ACIDS (FISH OIL) 1000 MG CAPS    Take by mouth. Take one capsule a day   POLYETHYLENE GLYCOL (MIRALAX / GLYCOLAX) PACKET    Take 17 g by mouth daily as needed.  Modified Medications   No medications on file  Discontinued Medications   No medications on file     Physical Exam: Physical Exam  Constitutional: She is oriented to person, place, and time. She appears well-developed and well-nourished. No distress.  HENT:  Head: Normocephalic and atraumatic.  Right Ear: External ear normal.  Left Ear: External ear normal.  Nose: Nose normal.  Mouth/Throat: Oropharynx is clear and moist. No oropharyngeal exudate.  Eyes: Conjunctivae and EOM are normal. Pupils are equal, round, and reactive to light. Right eye exhibits no discharge. Left eye exhibits no discharge. No scleral icterus.    Neck: Normal range of motion. Neck supple. No JVD present. No tracheal deviation present. No thyromegaly present.  Cardiovascular: Normal rate, regular rhythm, normal heart sounds and intact distal pulses.  Exam reveals no gallop and no friction rub.   No murmur heard. Pulmonary/Chest: Effort normal. No stridor. No respiratory distress. She has no wheezes. She has rales. She exhibits no tenderness.  Bibasilar dry rales.   Abdominal: Soft. Bowel sounds are normal. She exhibits no distension and no mass. There is no tenderness. There is no rebound and no guarding.  Musculoskeletal: Normal range of motion. She exhibits edema. She exhibits no tenderness.  Trace edema BLE. Slightly decreased ROM of the right wrist from previous fx. Chronic left knee pain with ambulation.   Lymphadenopathy:    She has no cervical adenopathy.  Neurological:  She is alert and oriented to person, place, and time. She has normal reflexes. No cranial nerve deficit. She exhibits normal muscle tone. Coordination normal.  Skin: No rash noted. She is not diaphoretic. No erythema. No pallor.  Abrasion left knee and left great toe-healed. Soft tissue injury the right lower abd and suprapubic area  developing into pressure ulcer-stage II 4x4cm, 0.5cm in depth with yellow slough covered wound bed-healed.   Psychiatric: She has a normal mood and affect. Her behavior is normal. Judgment and thought content normal. Cognition and memory are impaired. She exhibits abnormal recent memory. She exhibits normal remote memory.  Rather pleasant confused.    (type .physexam) Filed Vitals:   05/04/14 1228  BP: 146/80  Pulse: 72  Temp: 99.2 F (37.3 C)  TempSrc: Tympanic  Resp: 18      Labs reviewed: Basic Metabolic Panel:  Recent Labs  11/04/13 1814 11/05/13 0400  11/12/13 11/21/13 12/09/13 05/03/14  NA 130* 130*  < > 144 142 141 139  K 3.6* 3.5*  --  4.2 4.6 4.0 4.9  CL 85* 91*  --   --   --   --   --   CO2 27 26  --   --    --   --   --   GLUCOSE 101* 93  --   --   --   --   --   BUN 31* 45*  < > 22* 19 23* 23*  CREATININE 0.65 1.04  < > 0.4* 0.6 0.6 0.6  CALCIUM 9.3 8.6  --   --   --   --   --   MG 2.0  --   --   --   --   --   --   TSH  --   --   --  2.24  --   --   --   < > = values in this interval not displayed. Liver Function Tests:  Recent Labs  11/04/13 1814 11/05/13 0400 11/12/13 11/21/13 05/03/14  AST 185* 168* 40* 19 18  ALT 32 29 20 10 8   ALKPHOS 83 63 53 74 79  BILITOT 0.9 0.4  --   --   --   PROT 7.5 6.1  --   --   --   ALBUMIN 3.8 3.1*  --   --   --    CBC:  Recent Labs  11/04/13 1814 11/05/13 0400  11/21/13 12/09/13 05/03/14  WBC 6.6 6.6  < > 7.7 7.7 5.8  NEUTROABS 5.6 5.3  --   --   --   --   HGB 17.8* 15.0  < > 12.2 11.0* 13.4  HCT 49.8* 42.7  < > 36 33* 39  MCV 92.7 93.4  --   --   --   --   PLT 159 164  < > 236 267 207  < > = values in this interval not displayed. Past Procedures:  11/04/2013 CLINICAL DATA: Fall, weakness EXAM: CHEST - 1 VIEW COMPARISON: IMPRESSION: Stable hyperinflation. No acute process   11/04/2013 CLINICAL DATA: Fall EXAM: CT HEAD WITHOUT CONTRAST CT MAXILLOFACIAL WITHOUT CONTRAST CT CERVICAL SPINE WITHOUT CONTRAST: IMPRESSION: Mild soft tissue swelling overlying the right frontal bone/orbit. No evidence of acute intracranial abnormality. Atrophy with small vessel ischemic changes and intracranial atherosclerosis. No evidence of maxillofacial fracture. No evidence of traumatic injury to the cervical spine. Moderate multilevel degenerative changes.   11/05/13: EEG: IMPRESSION: This is a normal awake EEG  11/15/13 CXR no radiographic evidence of acute pulmonary disease.  12/08/13 X-ray R wrist: a transverse nondisplaced possible incomplete fracture of the distal radial metaphysis  12/08/13 CXR borderline to slightly elevated pulmonary vasculature. The lungs are clear.   12/08/13 X-ray R hand: severe osteopenia, moderate to severe degenerative arthritic changes  interphalangeal joints of the fingers and thumb.   01/11/14 X-ray L knee: moderate diffuse osteopenia, no acute fracture, malalignment, or lytic destructive lesion, no knee joint effusion, atherosclerotic vascular calcifications at the posterior soft tissues, minimal osteoarthritis, degenerative calcification at the knee joint involving the menisci.    05/03/14 CXR mild pulmonary venous hypertension w/o other evident of cardiac decompensation findings. No other active cardiopulmonary disease.    Assessment/Plan Unspecified constipation Prn MiraLax is adequate.      Insomnia, unspecified Sleeps better with Mirtazapine 15mg  qhs. No longer needs Lorazepam.    Memory deficit Emotional out burst on and off w/o focal neurological deficits noted. 05/03/14 CXR mild pulmonary venous hypertension w/o other evident of cardiac decompensation findings. No other active cardiopulmonary disease. CBC and CMP 05/03/14 unremarkable. UA pending. Continue to observe the patient. May try Nicotine patch if UTI is ruled out.   Hypoxemia Resolved.O2 Sat 98% RA. 05/03/14 CXR mild pulmonary venous hypertension w/o other evident of cardiac decompensation findings. No other active cardiopulmonary disease. CBC and CMP 05/03/14 unremarkable. UA pending.       Family/ Staff Communication: observe the patient.   Goals of Care: AL  Labs/tests ordered: CXR, CBC, CMP done 05/03/14. Urine culture pending.

## 2014-05-04 NOTE — Assessment & Plan Note (Addendum)
Emotional out burst on and off w/o focal neurological deficits noted. 05/03/14 CXR mild pulmonary venous hypertension w/o other evident of cardiac decompensation findings. No other active cardiopulmonary disease. CBC and CMP 05/03/14 unremarkable. UA pending. Continue to observe the patient. May try Nicotine patch if UTI is ruled out.

## 2014-05-07 ENCOUNTER — Encounter: Payer: Self-pay | Admitting: Nurse Practitioner

## 2014-05-13 IMAGING — CT CT MAXILLOFACIAL W/O CM
3 of 5 series · 16 of 30 positions shown, 18 images · non-contrast
Comparison: None.

CLINICAL DATA: Fall

EXAM:
CT HEAD WITHOUT CONTRAST
CT MAXILLOFACIAL WITHOUT CONTRAST
CT CERVICAL SPINE WITHOUT CONTRAST
TECHNIQUE: Multidetector CT imaging of the head, cervical spine, and
maxillofacial structures were performed using the standard protocol
without intravenous contrast. Multiplanar CT image reconstructions
of the cervical spine and maxillofacial structures were also
generated.

[Series 3: facial st · axial · 0.31mm/px · z∈[-180,-68]mm · 5 of 85 slices shown, 7 images]
[im 15/85  brain]
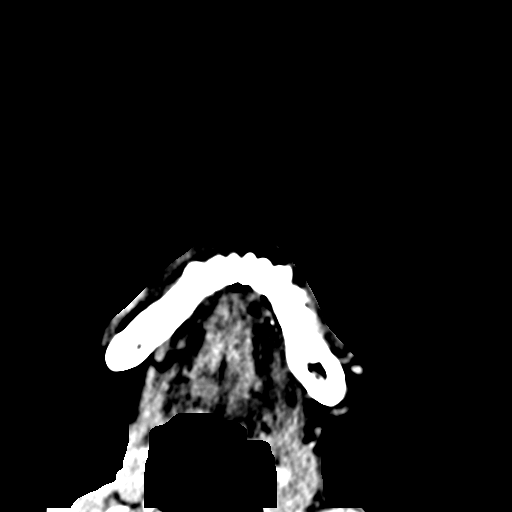
[im 15/85  bone]
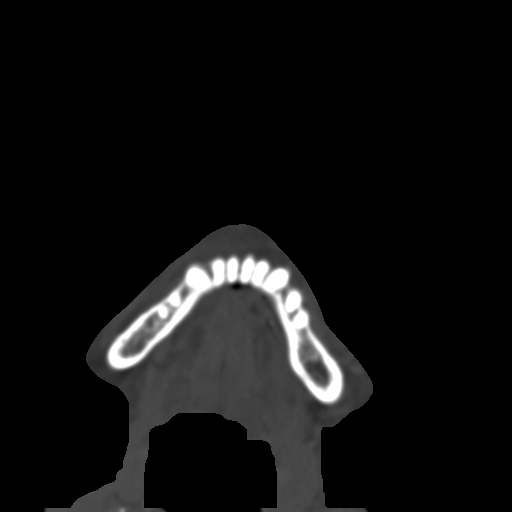
[im 29/85  bone]
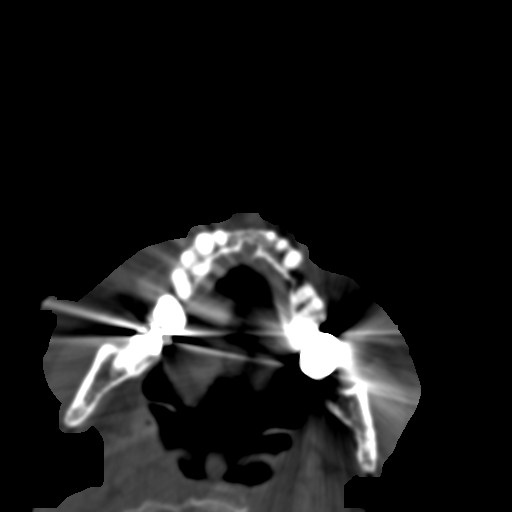
[im 43/85  bone]
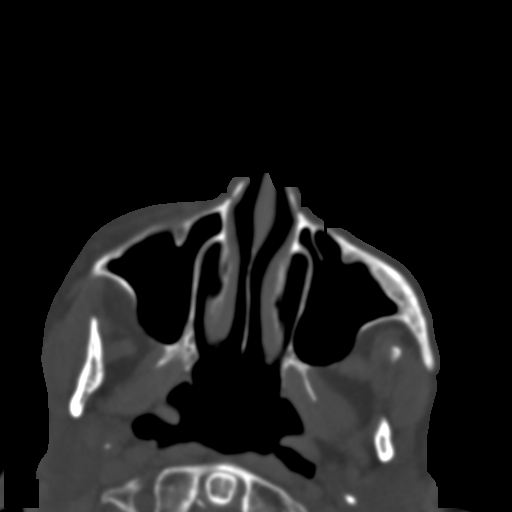
[im 57/85  bone]
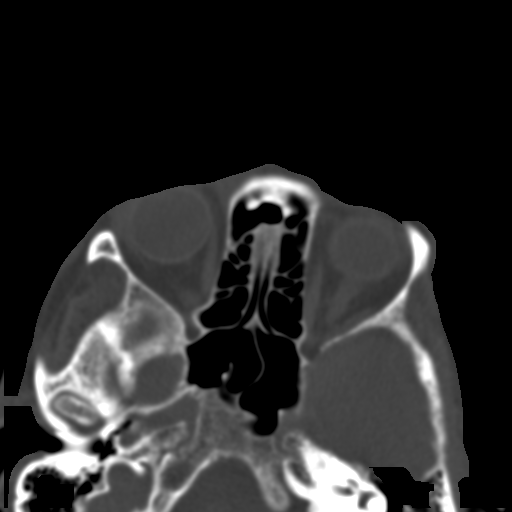
[im 71/85  brain]
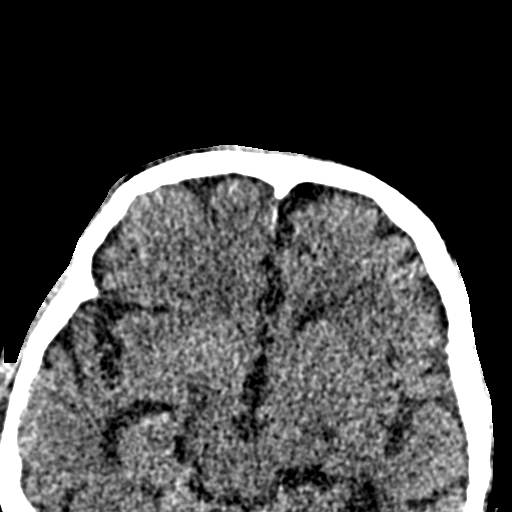
[im 71/85  bone]
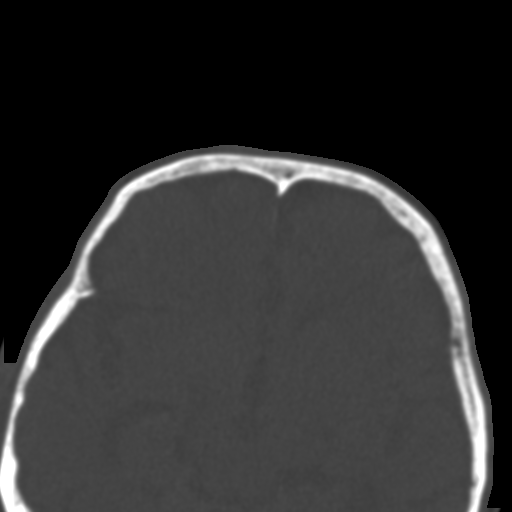

[Series 11: c-spine st · axial · 0.26mm/px · z∈[-216,-116]mm · 5 of 76 slices shown]
[im 13/76  bone]
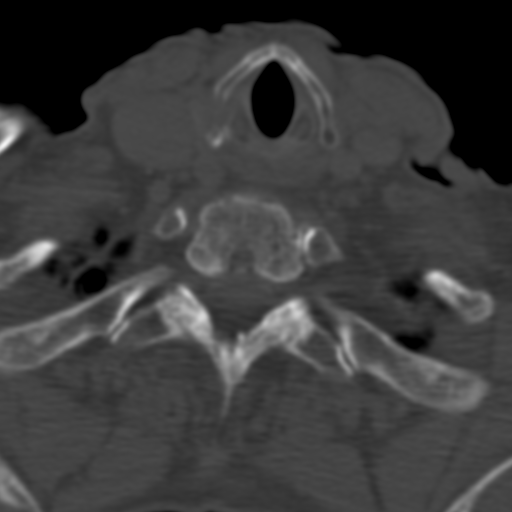
[im 26/76  bone]
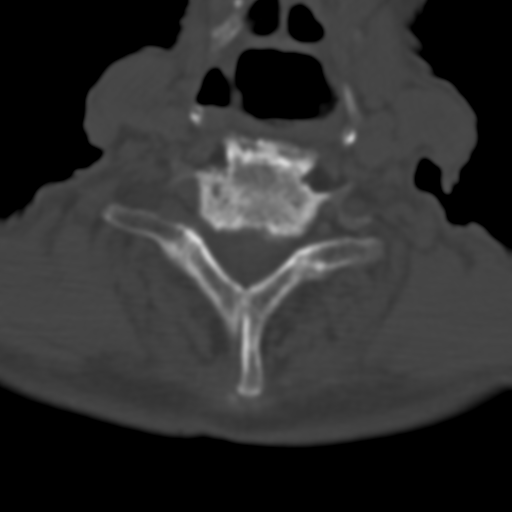
[im 38/76  bone]
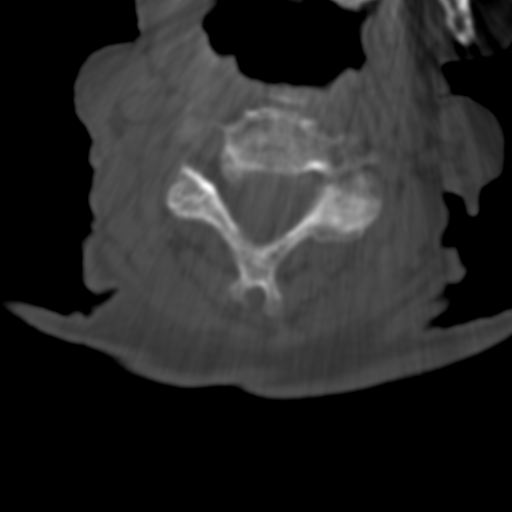
[im 51/76  bone]
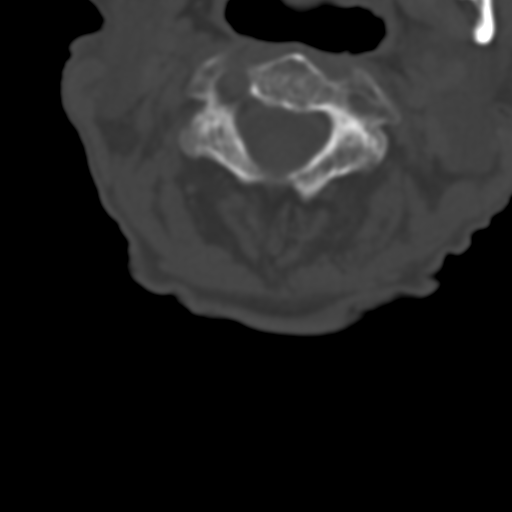
[im 63/76  bone]
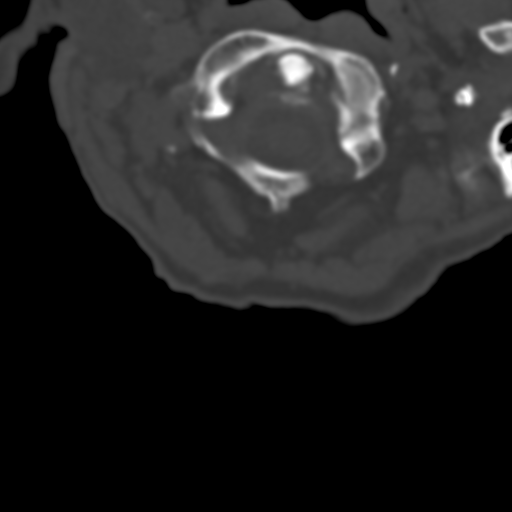

[Series 15: axial · axial · 0.23mm/px · z∈[-239,-134]mm · 6 of 81 slices shown]
[im 12/81  bone]
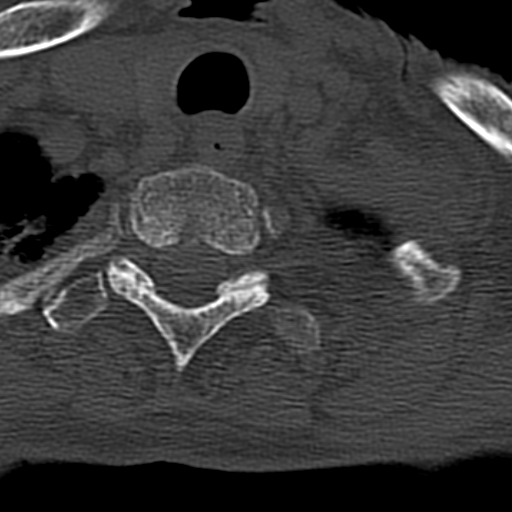
[im 23/81  bone]
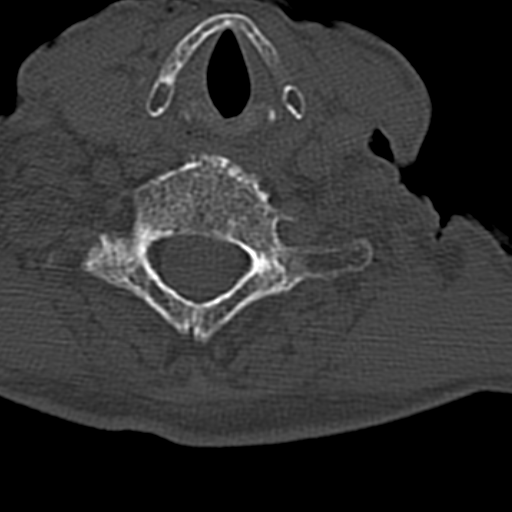
[im 35/81  bone]
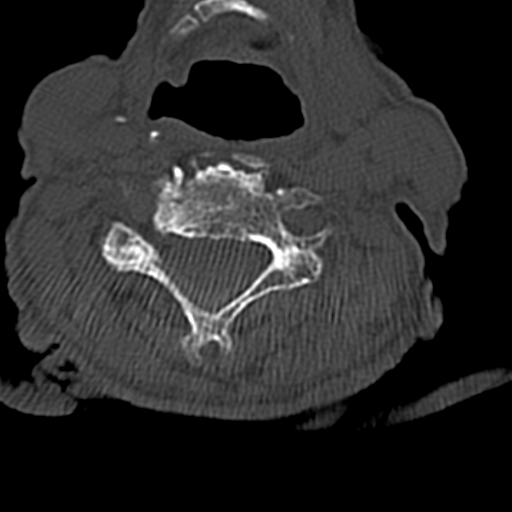
[im 46/81  bone]
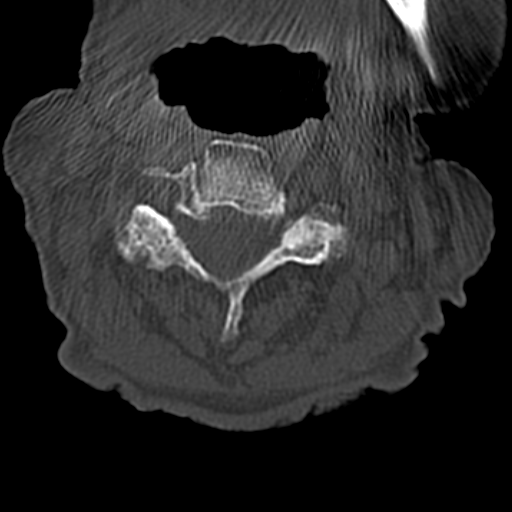
[im 58/81  bone]
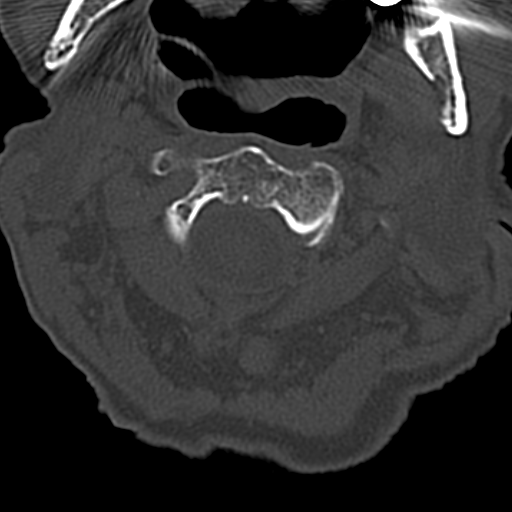
[im 69/81  bone]
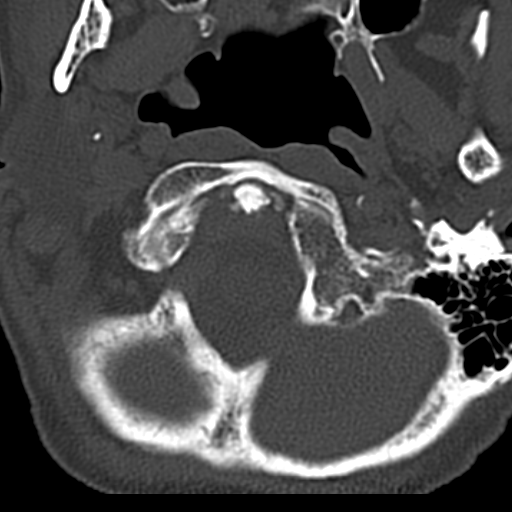

[16 of 30 positions shown; findings below may reference images not displayed]

FINDINGS: CT HEAD FINDINGS

No evidence of parenchymal hemorrhage or extra-axial fluid
collection. No mass lesion, mass effect, or midline shift.

No CT evidence of acute infarction.

Subcortical white matter and periventricular small vessel ischemic
changes. Intracranial atherosclerosis.

Mild age related atrophy.  No ventriculomegaly.

The visualized paranasal sinuses are essentially clear. The mastoid
air cells are unopacified.

Mild soft tissue swelling overlying the right frontal bone (series
17/image 8).

No evidence of calvarial fracture.

CT MAXILLOFACIAL FINDINGS

Mild soft tissue swelling overlying the right frontal bone/lateral
orbit.

No evidence of maxillofacial fracture.

Bilateral orbits, including the globes and retroconal soft tissues,
are within normal limits.

The visualized paranasal sinuses are essentially clear. The mastoid
air cells are unopacified.

CT CERVICAL SPINE FINDINGS

Reversal of the normal cervical lordosis.

No evidence of fracture or dislocation. Vertebral body heights are
maintained. Dens appears intact.

No prevertebral soft tissue swelling.

Moderate degenerative changes at C5-6 and C6-7.

Visualized thyroid is unremarkable.

Visualized lung apices are notable for biapical pleural parenchymal
scarring, right greater than left.
IMPRESSION: Mild soft tissue swelling overlying the right frontal bone/orbit.

No evidence of acute intracranial abnormality. Atrophy with small
vessel ischemic changes and intracranial atherosclerosis.

No evidence of maxillofacial fracture.

No evidence of traumatic injury to the cervical spine. Moderate
multilevel degenerative changes.

## 2014-05-13 IMAGING — CR DG CHEST 1V
1 series · 1 of 1 positions shown · non-contrast
Comparison: 02/28/2012

CLINICAL DATA: Fall, weakness

EXAM:
CHEST - 1 VIEW

[x chest ap]
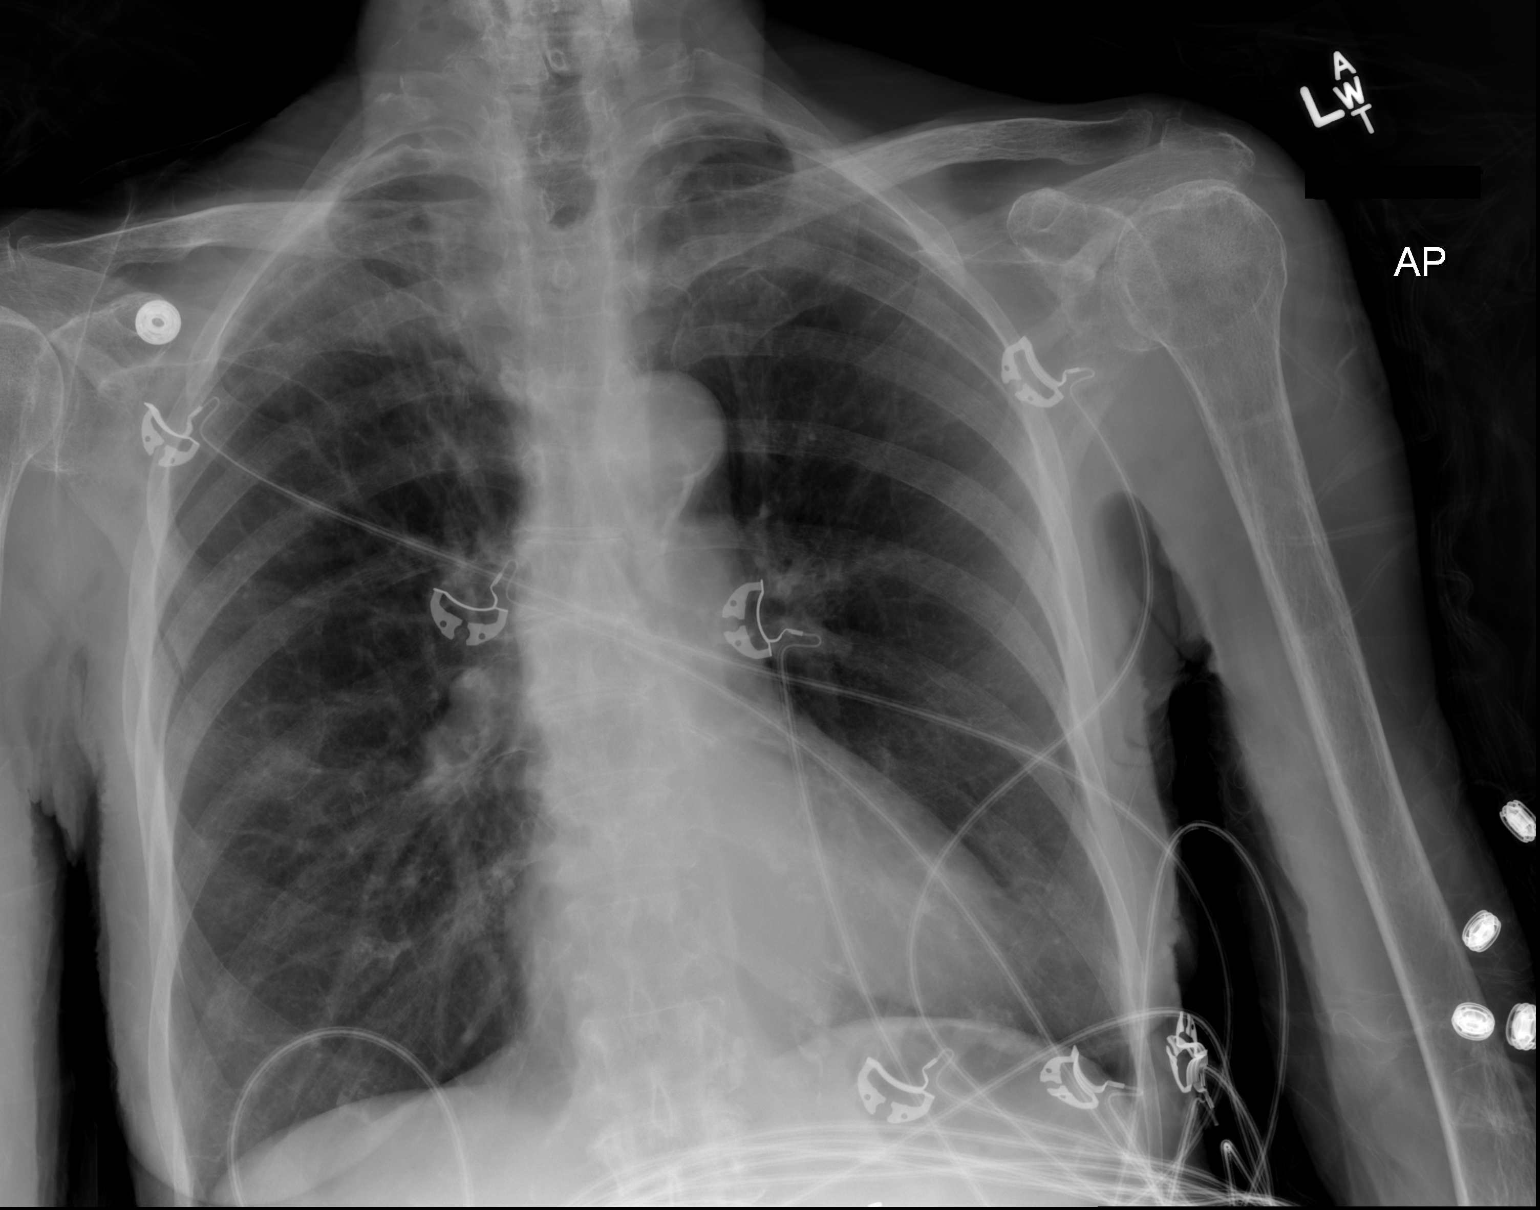

[1 of 1 positions shown; findings below may reference images not displayed]

FINDINGS: Hyperinflation noted without CHF or pneumonia. No effusion or
pneumothorax. Trachea midline. Normal vascularity. Atherosclerosis
of the aorta. Trachea is midline.
IMPRESSION: Stable hyperinflation.  No acute process

## 2014-09-24 ENCOUNTER — Non-Acute Institutional Stay: Payer: Medicare Other | Admitting: Nurse Practitioner

## 2014-09-24 ENCOUNTER — Encounter: Payer: Self-pay | Admitting: Nurse Practitioner

## 2014-09-24 DIAGNOSIS — R413 Other amnesia: Secondary | ICD-10-CM

## 2014-09-24 DIAGNOSIS — G4700 Insomnia, unspecified: Secondary | ICD-10-CM

## 2014-09-24 DIAGNOSIS — D638 Anemia in other chronic diseases classified elsewhere: Secondary | ICD-10-CM

## 2014-09-24 DIAGNOSIS — E039 Hypothyroidism, unspecified: Secondary | ICD-10-CM

## 2014-09-24 DIAGNOSIS — K5900 Constipation, unspecified: Secondary | ICD-10-CM

## 2014-09-24 DIAGNOSIS — N390 Urinary tract infection, site not specified: Secondary | ICD-10-CM

## 2014-09-24 DIAGNOSIS — F411 Generalized anxiety disorder: Secondary | ICD-10-CM

## 2014-09-24 DIAGNOSIS — R319 Hematuria, unspecified: Secondary | ICD-10-CM

## 2014-09-24 DIAGNOSIS — I10 Essential (primary) hypertension: Secondary | ICD-10-CM

## 2014-09-24 NOTE — Assessment & Plan Note (Signed)
TSH 2.238 11/12/13, not taking thyroid supplement.

## 2014-09-24 NOTE — Assessment & Plan Note (Signed)
Prn MiraLax is adequate.  

## 2014-09-24 NOTE — Assessment & Plan Note (Signed)
Controlled. Off med

## 2014-09-24 NOTE — Assessment & Plan Note (Signed)
Emotional out burst on and off w/o focal neurological deficits noted-has been improved until the onset of UTI

## 2014-09-24 NOTE — Assessment & Plan Note (Signed)
05/03/14 Hgb 13.4

## 2014-09-24 NOTE — Assessment & Plan Note (Signed)
Sleeps better with Mirtazapine 15mg  qhs. No longer needs Lorazepam.

## 2014-09-24 NOTE — Progress Notes (Signed)
Patient ID: Carol [name], female   DOB: [birth year], 80 y.o.   MRN: [id number]   Code Status:  DNR MOST  No Known Allergies  Chief Complaint  Patient presents with  . Medical Management of Chronic Issues  . Acute Visit    UTI    HPI: Patient is a 80 y.o. female seen in the AL at [facility] today for evaluation of UTI  and chronic medical conditions.     Problem List Items Addressed This Visit    UTI (urinary tract infection) - Primary    09/24/14 UA cloudy, pos nitrite, large leukocyte esterase, wbc>30, many bacteria. Will empirical ABT-Septra DS bid x 7 days along with FloraStor.     Memory deficit    Emotional out burst on and off w/o focal neurological deficits noted-has been improved until the onset of UTI      Insomnia    Sleeps better with Mirtazapine 15mg  qhs. No longer needs Lorazepam.       Hypothyroidism    TSH 2.238 11/12/13, not taking thyroid supplement.       Generalized anxiety disorder    No c/o fearfulness. Eats and sleeps better. Continue  Mirtazapine15mg       Essential hypertension    Controlled. Off med     Constipation    Prn MiraLax is adequate.       Anemia of chronic disease    05/03/14 Hgb 13.4        Review of Systems:  Review of Systems  Constitutional: Negative for fever, chills, weight loss, malaise/fatigue and diaphoresis.  HENT: Positive for hearing loss. Negative for congestion, ear discharge, ear pain, nosebleeds, sore throat and tinnitus.   Eyes: Negative for blurred vision, double vision, photophobia, pain, discharge and redness.  Respiratory: Negative for cough, hemoptysis, sputum production, shortness of breath, wheezing and stridor.        O2 desaturation on exertion.   Cardiovascular: Positive for leg swelling. Negative for chest pain, palpitations, orthopnea, claudication and PND.       Trace edema BLE  Gastrointestinal: Negative for heartburn, nausea, vomiting, abdominal pain, diarrhea, constipation, blood in  stool and melena.  Genitourinary: Positive for frequency. Negative for dysuria, urgency, hematuria and flank pain.       Chronic  Musculoskeletal: Positive for joint pain and falls. Negative for myalgias, back pain and neck pain.       Tight right wrist/hand swelling, warmth, tenderness-distal radial metaphysis fx-healed. Chronic left knee pain   Skin: Negative for itching and rash.       Pigmented BLE from knee down.   Neurological: Negative for dizziness, tingling, tremors, sensory change, speech change, focal weakness, seizures, loss of consciousness, weakness and headaches.  Endo/Heme/Allergies: Negative for environmental allergies and polydipsia. Does not bruise/bleed easily.  Psychiatric/Behavioral: Positive for memory loss. Negative for depression, suicidal ideas, hallucinations and substance abuse. The patient is nervous/anxious and has insomnia.        Sleeps better at night. Less fearful to be left alone.      Past Medical History  Diagnosis Date  . AK (actinic keratosis)     right tip of nose and right posterior leg  . Cancer 08/07/12    squamous cell ca,keratoacanthoma-right posterior leg  . Skin cancer 06/2011    scc right elbow tx included excision  . Radiation 09/10/2012-10/24/2012    30 fractions to right posterior calf  . Thyroid disease   . Anemia   . Hypertension   . Varicose vein  of leg   . Hemorrhoids   . Spastic colon   . Insomnia, unspecified   . Unspecified urinary incontinence   . Hypoxemia   . Unspecified hypothyroidism   . Abnormality of gait   . Arthritis   . Osteoarthrosis, unspecified whether generalized or localized, unspecified site   . Radiation 09/10/12-10/24/12    Squamous cell right posterior calf 6000 cGy  . Traumatic closed nondisp torus fracture of distal radial metaphysis 12/08/13   Past Surgical History  Procedure Laterality Date  . Abdominal hysterectomy      early 40's, abdominal  . Appendectomy    . Anterior heel repair    . Total  hip arthroplasty  07/31/2010    right  . Pheochromocytoma      left  . Fracture surgery  2003    left   . Wrist fracture surgery  2003    left Dr. Newman Nip   Social History:   reports that she quit smoking about 35 years ago. Her smoking use included Cigarettes. She has a 5 pack-year smoking history. She has never used smokeless tobacco. She reports that she drinks about 1.8 oz of alcohol per week. She reports that she does not use illicit drugs.  Family History  Problem Relation Age of Onset  . Stroke Mother   . Stroke Father     Medications: Patient's Medications  New Prescriptions   No medications on file  Previous Medications   ACETAMINOPHEN (TYLENOL) 325 MG TABLET    Take 2 tablets (650 mg total) by mouth every 4 (four) hours as needed for mild pain (temperature >/= 99.5 F).   ASPIRIN 81 MG TABLET    Take 1 tablet (81 mg total) by mouth daily.   MIRTAZAPINE (REMERON) 15 MG TABLET    Take 15 mg by mouth at bedtime.   OMEGA-3 FATTY ACIDS (FISH OIL) 1000 MG CAPS    Take by mouth. Take one capsule a day   POLYETHYLENE GLYCOL (MIRALAX / GLYCOLAX) PACKET    Take 17 g by mouth daily as needed.  Modified Medications   No medications on file  Discontinued Medications   No medications on file     Physical Exam: Physical Exam  Constitutional: She is oriented to person, place, and time. She appears well-developed and well-nourished. No distress.  HENT:  Head: Normocephalic and atraumatic.  Right Ear: External ear normal.  Left Ear: External ear normal.  Nose: Nose normal.  Mouth/Throat: Oropharynx is clear and moist. No oropharyngeal exudate.  Eyes: Conjunctivae and EOM are normal. Pupils are equal, round, and reactive to light. Right eye exhibits no discharge. Left eye exhibits no discharge. No scleral icterus.  Neck: Normal range of motion. Neck supple. No JVD present. No tracheal deviation present. No thyromegaly present.  Cardiovascular: Normal rate, regular rhythm,  normal heart sounds and intact distal pulses.  Exam reveals no gallop and no friction rub.   No murmur heard. Pulmonary/Chest: Effort normal. No stridor. No respiratory distress. She has no wheezes. She has rales. She exhibits no tenderness.  Bibasilar dry rales.   Abdominal: Soft. Bowel sounds are normal. She exhibits no distension and no mass. There is no tenderness. There is no rebound and no guarding.  Musculoskeletal: Normal range of motion. She exhibits edema. She exhibits no tenderness.  Trace edema BLE. Slightly decreased ROM of the right wrist from previous fx. Chronic left knee pain with ambulation.   Lymphadenopathy:    She has no cervical adenopathy.  Neurological:  She is alert and oriented to person, place, and time. She has normal reflexes. No cranial nerve deficit. She exhibits normal muscle tone. Coordination normal.  Skin: No rash noted. She is not diaphoretic. No erythema. No pallor.  Pigmented BLE from knee down.   Psychiatric: She has a normal mood and affect. Her behavior is normal. Judgment and thought content normal. Cognition and memory are impaired. She exhibits abnormal recent memory. She exhibits normal remote memory.  Rather pleasant confused.    (type .physexam) Filed Vitals:   09/24/14 1629  BP: 146/90  Pulse: 80  Temp: 97.8 F (36.6 C)  TempSrc: Tympanic  Resp: 18      Labs reviewed: Basic Metabolic Panel:  Recent Labs  11/04/13 1814 11/05/13 0400  11/12/13 11/21/13 12/09/13 05/03/14  NA 130* 130*  < > 144 142 141 139  K 3.6* 3.5*  --  4.2 4.6 4.0 4.9  CL 85* 91*  --   --   --   --   --   CO2 27 26  --   --   --   --   --   GLUCOSE 101* 93  --   --   --   --   --   BUN 31* 45*  < > 22* 19 23* 23*  CREATININE 0.65 1.04  < > 0.4* 0.6 0.6 0.6  CALCIUM 9.3 8.6  --   --   --   --   --   MG 2.0  --   --   --   --   --   --   TSH  --   --   --  2.24  --   --   --   < > = values in this interval not displayed. Liver Function Tests:  Recent Labs   11/04/13 1814 11/05/13 0400 11/12/13 11/21/13 05/03/14  AST 185* 168* 40* 19 18  ALT 32 29 20 10 8   ALKPHOS 83 63 53 74 79  BILITOT 0.9 0.4  --   --   --   PROT 7.5 6.1  --   --   --   ALBUMIN 3.8 3.1*  --   --   --    CBC:  Recent Labs  11/04/13 1814 11/05/13 0400  11/21/13 12/09/13 05/03/14  WBC 6.6 6.6  < > 7.7 7.7 5.8  NEUTROABS 5.6 5.3  --   --   --   --   HGB 17.8* 15.0  < > 12.2 11.0* 13.4  HCT 49.8* 42.7  < > 36 33* 39  MCV 92.7 93.4  --   --   --   --   PLT 159 164  < > 236 267 207  < > = values in this interval not displayed. Past Procedures:  11/04/2013 CLINICAL DATA: Fall, weakness EXAM: CHEST - 1 VIEW COMPARISON: IMPRESSION: Stable hyperinflation. No acute process   11/04/2013 CLINICAL DATA: Fall EXAM: CT HEAD WITHOUT CONTRAST CT MAXILLOFACIAL WITHOUT CONTRAST CT CERVICAL SPINE WITHOUT CONTRAST: IMPRESSION: Mild soft tissue swelling overlying the right frontal bone/orbit. No evidence of acute intracranial abnormality. Atrophy with small vessel ischemic changes and intracranial atherosclerosis. No evidence of maxillofacial fracture. No evidence of traumatic injury to the cervical spine. Moderate multilevel degenerative changes.   11/05/13: EEG: IMPRESSION: This is a normal awake EEG  11/15/13 CXR no radiographic evidence of acute pulmonary disease.  12/08/13 X-ray R wrist: a transverse nondisplaced possible incomplete fracture of the distal radial metaphysis  12/08/13  CXR borderline to slightly elevated pulmonary vasculature. The lungs are clear.   12/08/13 X-ray R hand: severe osteopenia, moderate to severe degenerative arthritic changes interphalangeal joints of the fingers and thumb.   01/11/14 X-ray L knee: moderate diffuse osteopenia, no acute fracture, malalignment, or lytic destructive lesion, no knee joint effusion, atherosclerotic vascular calcifications at the posterior soft tissues, minimal osteoarthritis, degenerative calcification at the knee joint involving the  menisci.    05/03/14 CXR mild pulmonary venous hypertension w/o other evident of cardiac decompensation findings. No other active cardiopulmonary disease.    Assessment/Plan UTI (urinary tract infection) 09/24/14 UA cloudy, pos nitrite, large leukocyte esterase, wbc>30, many bacteria. Will empirical ABT-Septra DS bid x 7 days along with FloraStor.   Anemia of chronic disease 05/03/14 Hgb 13.4   Generalized anxiety disorder No c/o fearfulness. Eats and sleeps better. Continue  Mirtazapine15mg     Insomnia Sleeps better with Mirtazapine 15mg  qhs. No longer needs Lorazepam.     Memory deficit Emotional out burst on and off w/o focal neurological deficits noted-has been improved until the onset of UTI    Constipation Prn MiraLax is adequate.     Essential hypertension Controlled. Off med   Hypothyroidism TSH 2.238 11/12/13, not taking thyroid supplement.       Family/ Staff Communication: observe the patient.   Goals of Care: AL  Labs/tests ordered: urine culture pending.

## 2014-09-24 NOTE — Assessment & Plan Note (Signed)
No c/o fearfulness. Eats and sleeps better. Continue  Mirtazapine15mg 

## 2014-09-24 NOTE — Assessment & Plan Note (Signed)
09/24/14 UA cloudy, pos nitrite, large leukocyte esterase, wbc>30, many bacteria. Will empirical ABT-Septra DS bid x 7 days along with FloraStor.

## 2014-10-12 ENCOUNTER — Non-Acute Institutional Stay: Payer: Medicare Other | Admitting: Nurse Practitioner

## 2014-10-12 DIAGNOSIS — I10 Essential (primary) hypertension: Secondary | ICD-10-CM

## 2014-10-12 DIAGNOSIS — E039 Hypothyroidism, unspecified: Secondary | ICD-10-CM

## 2014-10-12 DIAGNOSIS — F411 Generalized anxiety disorder: Secondary | ICD-10-CM | POA: Diagnosis not present

## 2014-10-12 DIAGNOSIS — N390 Urinary tract infection, site not specified: Secondary | ICD-10-CM

## 2014-10-12 DIAGNOSIS — K5900 Constipation, unspecified: Secondary | ICD-10-CM

## 2014-10-12 DIAGNOSIS — R413 Other amnesia: Secondary | ICD-10-CM

## 2014-10-12 NOTE — Assessment & Plan Note (Signed)
Fully treated 

## 2014-10-12 NOTE — Assessment & Plan Note (Signed)
Easily upset, tearfulness, emotional outbursts w/o apparent cause, treated UTI didn't help, will increase  Mirtazapine to 30mg . Observe

## 2014-10-12 NOTE — Assessment & Plan Note (Signed)
Controlled. Off med

## 2014-10-12 NOTE — Assessment & Plan Note (Signed)
Prn MiraLax is adequate.  

## 2014-10-12 NOTE — Assessment & Plan Note (Signed)
10/06/14 MMSE 10/30

## 2014-10-12 NOTE — Progress Notes (Signed)
Patient ID: Carol [name], female   DOB: 1928/09/23, 81 y.o.   MRN: [id number]   Code Status:  DNR MOST  No Known Allergies  Chief Complaint  Patient presents with  . Medical Management of Chronic Issues  . Acute Visit    persisted anxiety, tearfulness     HPI: Patient is a 81 y.o. female seen in the AL at [facility] today for evaluation of memory, emotional outbursts, and chronic medical conditions.     Problem List Items Addressed This Visit    UTI (urinary tract infection)    Fully treated    Memory deficit    10/06/14 MMSE 10/30     Hypothyroidism    TSH 2.238 11/12/13, not taking thyroid supplement.       Generalized anxiety disorder - Primary    Easily upset, tearfulness, emotional outbursts w/o apparent cause, treated UTI didn't help, will increase  Mirtazapine to 30mg . Observe       Essential hypertension    Controlled. Off med      Constipation    Prn MiraLax is adequate.         Review of Systems:  Review of Systems  Constitutional: Negative for fever, chills, weight loss, malaise/fatigue and diaphoresis.  HENT: Positive for hearing loss. Negative for congestion, ear discharge, ear pain, nosebleeds, sore throat and tinnitus.   Eyes: Negative for blurred vision, double vision, photophobia, pain, discharge and redness.  Respiratory: Negative for cough, hemoptysis, sputum production, shortness of breath, wheezing and stridor.        O2 desaturation on exertion.   Cardiovascular: Positive for leg swelling. Negative for chest pain, palpitations, orthopnea, claudication and PND.       Trace edema BLE  Gastrointestinal: Negative for heartburn, nausea, vomiting, abdominal pain, diarrhea, constipation, blood in stool and melena.  Genitourinary: Positive for frequency. Negative for dysuria, urgency, hematuria and flank pain.       Chronic  Musculoskeletal: Positive for joint pain and falls. Negative for myalgias, back pain and neck pain.       Tight right  wrist/hand swelling, warmth, tenderness-distal radial metaphysis fx-healed. Chronic left knee pain   Skin: Negative for itching and rash.       Pigmented BLE from knee down.   Neurological: Negative for dizziness, tingling, tremors, sensory change, speech change, focal weakness, seizures, loss of consciousness, weakness and headaches.  Endo/Heme/Allergies: Negative for environmental allergies and polydipsia. Does not bruise/bleed easily.  Psychiatric/Behavioral: Positive for memory loss. Negative for depression, suicidal ideas, hallucinations and substance abuse. The patient is nervous/anxious and has insomnia.        Sleeps better at night.  Fearfulness. Emotional outbursts     Past Medical History  Diagnosis Date  . AK (actinic keratosis)     right tip of nose and right posterior leg  . Cancer 08/07/12    squamous cell ca,keratoacanthoma-right posterior leg  . Skin cancer 06/2011    scc right elbow tx included excision  . Radiation 09/10/2012-10/24/2012    30 fractions to right posterior calf  . Thyroid disease   . Anemia   . Hypertension   . Varicose vein of leg   . Hemorrhoids   . Spastic colon   . Insomnia, unspecified   . Unspecified urinary incontinence   . Hypoxemia   . Unspecified hypothyroidism   . Abnormality of gait   . Arthritis   . Osteoarthrosis, unspecified whether generalized or localized, unspecified site   . Radiation 09/10/12-10/24/12  Squamous cell right posterior calf 6000 cGy  . Traumatic closed nondisp torus fracture of distal radial metaphysis 12/08/13   Past Surgical History  Procedure Laterality Date  . Abdominal hysterectomy      early 40's, abdominal  . Appendectomy    . Anterior heel repair    . Total hip arthroplasty  07/31/2010    right  . Pheochromocytoma      left  . Fracture surgery  2003    left   . Wrist fracture surgery  2003    left Dr. Newman Nip   Social History:   reports that she quit smoking about 35 years ago. Her  smoking use included Cigarettes. She has a 5 pack-year smoking history. She has never used smokeless tobacco. She reports that she drinks about 1.8 oz of alcohol per week. She reports that she does not use illicit drugs.  Family History  Problem Relation Age of Onset  . Stroke Mother   . Stroke Father     Medications: Patient's Medications  New Prescriptions   No medications on file  Previous Medications   ACETAMINOPHEN (TYLENOL) 325 MG TABLET    Take 2 tablets (650 mg total) by mouth every 4 (four) hours as needed for mild pain (temperature >/= 99.5 F).   ASPIRIN 81 MG TABLET    Take 1 tablet (81 mg total) by mouth daily.   MIRTAZAPINE (REMERON) 30 MG TABLET    Take 30 mg by mouth at bedtime.   OMEGA-3 FATTY ACIDS (FISH OIL) 1000 MG CAPS    Take by mouth. Take one capsule a day   POLYETHYLENE GLYCOL (MIRALAX / GLYCOLAX) PACKET    Take 17 g by mouth daily as needed.  Modified Medications   No medications on file  Discontinued Medications   MIRTAZAPINE (REMERON) 15 MG TABLET    Take 15 mg by mouth at bedtime.     Physical Exam: Physical Exam  Constitutional: She is oriented to person, place, and time. She appears well-developed and well-nourished. No distress.  HENT:  Head: Normocephalic and atraumatic.  Right Ear: External ear normal.  Left Ear: External ear normal.  Nose: Nose normal.  Mouth/Throat: Oropharynx is clear and moist. No oropharyngeal exudate.  Eyes: Conjunctivae and EOM are normal. Pupils are equal, round, and reactive to light. Right eye exhibits no discharge. Left eye exhibits no discharge. No scleral icterus.  Neck: Normal range of motion. Neck supple. No JVD present. No tracheal deviation present. No thyromegaly present.  Cardiovascular: Normal rate, regular rhythm, normal heart sounds and intact distal pulses.  Exam reveals no gallop and no friction rub.   No murmur heard. Pulmonary/Chest: Effort normal. No stridor. No respiratory distress. She has no wheezes.  She has rales. She exhibits no tenderness.  Bibasilar dry rales.   Abdominal: Soft. Bowel sounds are normal. She exhibits no distension and no mass. There is no tenderness. There is no rebound and no guarding.  Musculoskeletal: Normal range of motion. She exhibits edema. She exhibits no tenderness.  Trace edema BLE. Slightly decreased ROM of the right wrist from previous fx. Chronic left knee pain with ambulation.   Lymphadenopathy:    She has no cervical adenopathy.  Neurological: She is alert and oriented to person, place, and time. She has normal reflexes. No cranial nerve deficit. She exhibits normal muscle tone. Coordination normal.  Skin: No rash noted. She is not diaphoretic. No erythema. No pallor.  Pigmented BLE from knee down.   Psychiatric: She has  a normal mood and affect. Her behavior is normal. Judgment and thought content normal. Cognition and memory are impaired. She exhibits abnormal recent memory. She exhibits normal remote memory.  Rather pleasant confused.    (type .physexam) Filed Vitals:   10/12/14 1526  BP: 146/80  Pulse: 84  Temp: 97.6 F (36.4 C)  TempSrc: Tympanic  Resp: 20      Labs reviewed: Basic Metabolic Panel:  Recent Labs  11/04/13 1814 11/05/13 0400  11/12/13 11/21/13 12/09/13 05/03/14  NA 130* 130*  < > 144 142 141 139  K 3.6* 3.5*  --  4.2 4.6 4.0 4.9  CL 85* 91*  --   --   --   --   --   CO2 27 26  --   --   --   --   --   GLUCOSE 101* 93  --   --   --   --   --   BUN 31* 45*  < > 22* 19 23* 23*  CREATININE 0.65 1.04  < > 0.4* 0.6 0.6 0.6  CALCIUM 9.3 8.6  --   --   --   --   --   MG 2.0  --   --   --   --   --   --   TSH  --   --   --  2.24  --   --   --   < > = values in this interval not displayed. Liver Function Tests:  Recent Labs  11/04/13 1814 11/05/13 0400 11/12/13 11/21/13 05/03/14  AST 185* 168* 40* 19 18  ALT 32 29 20 10 8   ALKPHOS 83 63 53 74 79  BILITOT 0.9 0.4  --   --   --   PROT 7.5 6.1  --   --   --   ALBUMIN  3.8 3.1*  --   --   --    CBC:  Recent Labs  11/04/13 1814 11/05/13 0400  11/21/13 12/09/13 05/03/14  WBC 6.6 6.6  < > 7.7 7.7 5.8  NEUTROABS 5.6 5.3  --   --   --   --   HGB 17.8* 15.0  < > 12.2 11.0* 13.4  HCT 49.8* 42.7  < > 36 33* 39  MCV 92.7 93.4  --   --   --   --   PLT 159 164  < > 236 267 207  < > = values in this interval not displayed. Past Procedures:  11/04/2013 CLINICAL DATA: Fall, weakness EXAM: CHEST - 1 VIEW COMPARISON: IMPRESSION: Stable hyperinflation. No acute process   11/04/2013 CLINICAL DATA: Fall EXAM: CT HEAD WITHOUT CONTRAST CT MAXILLOFACIAL WITHOUT CONTRAST CT CERVICAL SPINE WITHOUT CONTRAST: IMPRESSION: Mild soft tissue swelling overlying the right frontal bone/orbit. No evidence of acute intracranial abnormality. Atrophy with small vessel ischemic changes and intracranial atherosclerosis. No evidence of maxillofacial fracture. No evidence of traumatic injury to the cervical spine. Moderate multilevel degenerative changes.   11/05/13: EEG: IMPRESSION: This is a normal awake EEG  11/15/13 CXR no radiographic evidence of acute pulmonary disease.  12/08/13 X-ray R wrist: a transverse nondisplaced possible incomplete fracture of the distal radial metaphysis  12/08/13 CXR borderline to slightly elevated pulmonary vasculature. The lungs are clear.   12/08/13 X-ray R hand: severe osteopenia, moderate to severe degenerative arthritic changes interphalangeal joints of the fingers and thumb.   01/11/14 X-ray L knee: moderate diffuse osteopenia, no acute fracture, malalignment, or lytic destructive lesion,  no knee joint effusion, atherosclerotic vascular calcifications at the posterior soft tissues, minimal osteoarthritis, degenerative calcification at the knee joint involving the menisci.    05/03/14 CXR mild pulmonary venous hypertension w/o other evident of cardiac decompensation findings. No other active cardiopulmonary disease.    Assessment/Plan Generalized anxiety  disorder Easily upset, tearfulness, emotional outbursts w/o apparent cause, treated UTI didn't help, will increase  Mirtazapine to 30mg . Observe     UTI (urinary tract infection) Fully treated  Memory deficit 10/06/14 MMSE 10/30   Hypothyroidism TSH 2.238 11/12/13, not taking thyroid supplement.     Essential hypertension Controlled. Off med    Constipation Prn MiraLax is adequate.      Family/ Staff Communication: observe the patient.   Goals of Care: AL  Labs/tests ordered: none

## 2014-10-12 NOTE — Assessment & Plan Note (Signed)
TSH 2.238 11/12/13, not taking thyroid supplement.

## 2014-11-01 DIAGNOSIS — N390 Urinary tract infection, site not specified: Secondary | ICD-10-CM | POA: Diagnosis not present

## 2014-11-11 ENCOUNTER — Emergency Department (HOSPITAL_COMMUNITY)
Admission: EM | Admit: 2014-11-11 | Discharge: 2014-11-11 | Disposition: A | Discharge status: Home / Self Care | Payer: Medicare Other | Attending: Emergency Medicine | Admitting: Emergency Medicine

## 2014-11-11 ENCOUNTER — Encounter (HOSPITAL_COMMUNITY): Payer: Self-pay | Admitting: Emergency Medicine

## 2014-11-11 DIAGNOSIS — R4182 Altered mental status, unspecified: Secondary | ICD-10-CM

## 2014-11-11 DIAGNOSIS — M1990 Unspecified osteoarthritis, unspecified site: Secondary | ICD-10-CM | POA: Diagnosis not present

## 2014-11-11 DIAGNOSIS — Z862 Personal history of diseases of the blood and blood-forming organs and certain disorders involving the immune mechanism: Secondary | ICD-10-CM | POA: Insufficient documentation

## 2014-11-11 DIAGNOSIS — E039 Hypothyroidism, unspecified: Secondary | ICD-10-CM | POA: Diagnosis not present

## 2014-11-11 DIAGNOSIS — F0390 Unspecified dementia without behavioral disturbance: Secondary | ICD-10-CM | POA: Diagnosis not present

## 2014-11-11 DIAGNOSIS — Z7982 Long term (current) use of aspirin: Secondary | ICD-10-CM | POA: Insufficient documentation

## 2014-11-11 DIAGNOSIS — Z79899 Other long term (current) drug therapy: Secondary | ICD-10-CM | POA: Insufficient documentation

## 2014-11-11 DIAGNOSIS — I10 Essential (primary) hypertension: Secondary | ICD-10-CM | POA: Diagnosis not present

## 2014-11-11 DIAGNOSIS — F99 Mental disorder, not otherwise specified: Secondary | ICD-10-CM | POA: Diagnosis not present

## 2014-11-11 DIAGNOSIS — Z85828 Personal history of other malignant neoplasm of skin: Secondary | ICD-10-CM | POA: Diagnosis not present

## 2014-11-11 DIAGNOSIS — Z87891 Personal history of nicotine dependence: Secondary | ICD-10-CM | POA: Diagnosis not present

## 2014-11-11 DIAGNOSIS — R6889 Other general symptoms and signs: Secondary | ICD-10-CM | POA: Diagnosis not present

## 2014-11-11 LAB — COMPREHENSIVE METABOLIC PANEL
ALT: 9 U/L (ref 0–35)
ANION GAP: 4 — AB (ref 5–15)
AST: 23 U/L (ref 0–37)
Albumin: 3.7 g/dL (ref 3.5–5.2)
Alkaline Phosphatase: 79 U/L (ref 39–117)
BILIRUBIN TOTAL: 0.5 mg/dL (ref 0.3–1.2)
BUN: 35 mg/dL — ABNORMAL HIGH (ref 6–23)
CO2: 33 mmol/L — ABNORMAL HIGH (ref 19–32)
CREATININE: 0.83 mg/dL (ref 0.50–1.10)
Calcium: 9.5 mg/dL (ref 8.4–10.5)
Chloride: 104 mmol/L (ref 96–112)
GFR calc Af Amer: 73 mL/min — ABNORMAL LOW (ref >90)
GFR calc non Af Amer: 63 mL/min — ABNORMAL LOW (ref >90)
Glucose, Bld: 99 mg/dL (ref 70–99)
Potassium: 5.1 mmol/L (ref 3.5–5.1)
SODIUM: 141 mmol/L (ref 135–145)
Total Protein: 6.8 g/dL (ref 6.0–8.3)

## 2014-11-11 LAB — CBC WITH DIFFERENTIAL/PLATELET
BASOS PCT: 0 % (ref 0–1)
Basophils Absolute: 0 10*3/uL (ref 0.0–0.1)
EOS ABS: 0.1 10*3/uL (ref 0.0–0.7)
EOS PCT: 3 % (ref 0–5)
HCT: 43.4 % (ref 36.0–46.0)
Hemoglobin: 13.7 g/dL (ref 12.0–15.0)
LYMPHS PCT: 31 % (ref 12–46)
Lymphs Abs: 1.4 10*3/uL (ref 0.7–4.0)
MCH: 31.2 pg (ref 26.0–34.0)
MCHC: 31.6 g/dL (ref 30.0–36.0)
MCV: 98.9 fL (ref 78.0–100.0)
Monocytes Absolute: 0.4 10*3/uL (ref 0.1–1.0)
Monocytes Relative: 9 % (ref 3–12)
Neutro Abs: 2.6 10*3/uL (ref 1.7–7.7)
Neutrophils Relative %: 57 % (ref 43–77)
Platelets: 170 10*3/uL (ref 150–400)
RBC: 4.39 MIL/uL (ref 3.87–5.11)
RDW: 13.7 % (ref 11.5–15.5)
WBC: 4.5 10*3/uL (ref 4.0–10.5)

## 2014-11-11 NOTE — ED Provider Notes (Signed)
CSN: [id number]     Arrival date & time 11/11/14  1927 History   First MD Initiated Contact with Patient 11/11/14 1928     Chief Complaint  Patient presents with  . Altered Mental Status     (Consider location/radiation/quality/duration/timing/severity/associated sxs/prior Treatment) The history is provided by the patient, the EMS personnel and the nursing home.  Carol [name] is a 81 y.o. female hx of dementia, HTN here with AMS. She is from [facility]. Around 6 PM she was not talking to the nurses. When EMS got there she was actively trying to close her eyes and actively try not to cooperate. She also has been fighting them when they try to put an IV in her. Upon arrival she is more awake and alert and now once the IV to be taken out. She has labs done several months ago that shows slightly elevated glucose. She occasionally gets UTIs but was checked several months ago and didn't have UTI.    Level V caveat- dementia   Past Medical History  Diagnosis Date  . AK (actinic keratosis)     right tip of nose and right posterior leg  . Cancer 08/07/12    squamous cell ca,keratoacanthoma-right posterior leg  . Skin cancer 06/2011    scc right elbow tx included excision  . Radiation 09/10/2012-10/24/2012    30 fractions to right posterior calf  . Thyroid disease   . Anemia   . Hypertension   . Varicose vein of leg   . Hemorrhoids   . Spastic colon   . Insomnia, unspecified   . Unspecified urinary incontinence   . Hypoxemia   . Unspecified hypothyroidism   . Abnormality of gait   . Arthritis   . Osteoarthrosis, unspecified whether generalized or localized, unspecified site   . Radiation 09/10/12-10/24/12    Squamous cell right posterior calf 6000 cGy  . Traumatic closed nondisp torus fracture of distal radial metaphysis 12/08/13   Past Surgical History  Procedure Laterality Date  . Abdominal hysterectomy      early 40's, abdominal  . Appendectomy    . Anterior heel  repair    . Total hip arthroplasty  07/31/2010    right  . Pheochromocytoma      left  . Fracture surgery  2003    left   . Wrist fracture surgery  2003    left Dr. Newman Nip   Family History  Problem Relation Age of Onset  . Stroke Mother   . Stroke Father    History  Substance Use Topics  . Smoking status: Former Smoker -- 0.50 packs/day for 10 years    Types: Cigarettes    Quit date: 10/30/1978  . Smokeless tobacco: Never Used  . Alcohol Use: 1.8 oz/week    3 Glasses of wine per week   OB History    No data available     Review of Systems  Unable to perform ROS: Dementia      Allergies  Review of patient's allergies indicates no known allergies.  Home Medications   Prior to Admission medications   Medication Sig Start Date End Date Taking? Authorizing Provider  acetaminophen (TYLENOL) 325 MG tablet Take 2 tablets (650 mg total) by mouth every 4 (four) hours as needed for mild pain (temperature >/= 99.5 F). 11/06/13   Christina P Rama, MD  aspirin 81 MG tablet Take 1 tablet (81 mg total) by mouth daily. 11/06/13   Venetia Maxon Rama, MD  mirtazapine (REMERON) 30 MG tablet Take 30 mg by mouth at bedtime.    Historical Provider, MD  Omega-3 Fatty Acids (FISH OIL) 1000 MG CAPS Take by mouth. Take one capsule a day    Historical Provider, MD  polyethylene glycol (MIRALAX / GLYCOLAX) packet Take 17 g by mouth daily as needed. 10/13/13   Mahima Pandey, MD   BP 156/82 mmHg  Temp(Src) 98.3 F (36.8 C) (Oral)  Resp 14  SpO2 91% Physical Exam  Constitutional:  Awake, alert, demented   HENT:  Head: Normocephalic and atraumatic.  Mouth/Throat: Oropharynx is clear and moist.  Eyes: Conjunctivae are normal. Pupils are equal, round, and reactive to light.  Neck: Normal range of motion. Neck supple.  Cardiovascular: Normal rate, regular rhythm and normal heart sounds.   Pulmonary/Chest: Effort normal and breath sounds normal. No respiratory distress. She has no wheezes.  She has no rales.  Abdominal: Soft. Bowel sounds are normal. She exhibits no distension. There is no tenderness. There is no rebound and no guarding.  Musculoskeletal: Normal range of motion. She exhibits no edema or tenderness.  Neurological:  Demented, alert, moving all extremities.   Skin: Skin is warm and dry.  Psychiatric:  Unable   Nursing note and vitals reviewed.   ED Course  Procedures (including critical care time) Labs Review Labs Reviewed  COMPREHENSIVE METABOLIC PANEL - Abnormal; Notable for the following:    CO2 33 (*)    BUN 35 (*)    GFR calc non Af Amer 63 (*)    GFR calc Af Amer 73 (*)    Anion gap 4 (*)    All other components within normal limits  CBC WITH DIFFERENTIAL/PLATELET    Imaging Review No results found.   EKG Interpretation None      MDM   Final diagnoses:  None    Carol [name] is a 81 y.o. female here with AMS. Now alert and awake and back to baseline. Labs at baseline. Previous bicarb was 32 and today is 33. Ambulated in the ED. Vitals stable. I doubt stroke or sepsis. Will dc back to facility.      Wandra Arthurs, MD 11/11/14 2059

## 2014-11-11 NOTE — ED Notes (Signed)
[facility] CALLED  °

## 2014-11-11 NOTE — ED Notes (Signed)
Per EMS, pt is from [place]. Per EMS, they were called out because pt was not verbally responding to staff. Staff reports that the pt was completely baseline at 1730, was still speaking but not quite baseline and 1800, and by the time EMS arrived at 1850, pt was catatonic and not cooperating with assessment. EMS states that the pt would hold her eyelids shut tighter when they attempted to open her eyes. They also report that the pt's arms were not flaccid, and that the pt flinched at the application of EKG patches and the initiating of the IV. Upon arrival to the ER at [facility], pt is verbal, and upset with staff.

## 2014-11-11 NOTE — ED Notes (Signed)
NAD at this time [facility] was called to pick pt up

## 2014-11-11 NOTE — Discharge Instructions (Signed)
Continue your current medicines.   Follow up with your doctor.   Return to ER if you are not behaving normally, falls.

## 2014-11-12 ENCOUNTER — Encounter: Payer: Self-pay | Admitting: Nurse Practitioner

## 2014-11-12 ENCOUNTER — Non-Acute Institutional Stay: Payer: Medicare Other | Admitting: Nurse Practitioner

## 2014-11-12 DIAGNOSIS — I10 Essential (primary) hypertension: Secondary | ICD-10-CM | POA: Diagnosis not present

## 2014-11-12 DIAGNOSIS — K5900 Constipation, unspecified: Secondary | ICD-10-CM | POA: Diagnosis not present

## 2014-11-12 DIAGNOSIS — F482 Pseudobulbar affect: Secondary | ICD-10-CM | POA: Diagnosis not present

## 2014-11-12 DIAGNOSIS — F411 Generalized anxiety disorder: Secondary | ICD-10-CM | POA: Diagnosis not present

## 2014-11-12 DIAGNOSIS — E039 Hypothyroidism, unspecified: Secondary | ICD-10-CM

## 2014-11-12 DIAGNOSIS — R413 Other amnesia: Secondary | ICD-10-CM

## 2014-11-12 NOTE — Progress Notes (Signed)
Patient ID: Carol [name], female   DOB: [birth year], 81 y.o.   MRN: [id number]   Code Status:  DNR MOST  No Known Allergies  Chief Complaint  Patient presents with  . Medical Management of Chronic Issues  . Acute Visit    f/u ED evaluation.     HPI: Patient is a 81 y.o. female seen in the AL at [facility] today for evaluation of s/p ED evaluation for AMS, persisted emotion outburst/confusion,  and chronic medical conditions.    11/11/14 ED eval for altered mental status: CBC and CMP unremarkable. Her mentation has returned to her baseline.     Problem List Items Addressed This Visit    PBA (pseudobulbar affect) - Primary    Inappropriate emotions noted and didn't respond to Mirtazapine. Will try Nuedexta-observe.        Memory deficit    10/06/14 MMSE 10/30       Hypothyroidism    TSH 2.238 11/12/13, not taking thyroid supplement.          Generalized anxiety disorder    Tearful at times, increased confusion, negative UA 11/01/14, increased Mirtazapine to 30mg  10/14/14 due to increased emotional behaviors, staff reported she often flips quickly from happy to sad-? PBA-will taper off Mirtazapine and try Nuedexta. Observe the patient.        Essential hypertension    Controlled. Off med       Constipation    Prn MiraLax is adequate.            Review of Systems:  Review of Systems  Constitutional: Negative for fever, chills, weight loss, malaise/fatigue and diaphoresis.  HENT: Positive for hearing loss. Negative for congestion, ear discharge, ear pain, nosebleeds, sore throat and tinnitus.   Eyes: Negative for blurred vision, double vision, photophobia, pain, discharge and redness.  Respiratory: Positive for cough. Negative for hemoptysis, sputum production, shortness of breath, wheezing and stridor.   Cardiovascular: Positive for leg swelling. Negative for chest pain, palpitations, orthopnea, claudication and PND.  Gastrointestinal: Negative for heartburn,  nausea, vomiting, abdominal pain, diarrhea, constipation, blood in stool and melena.  Genitourinary: Positive for frequency. Negative for dysuria, urgency, hematuria and flank pain.  Musculoskeletal: Negative for myalgias, back pain, joint pain, falls and neck pain.  Skin: Negative for itching and rash.  Neurological: Negative for dizziness, tingling, tremors, sensory change, speech change, focal weakness, seizures, loss of consciousness, weakness and headaches.  Endo/Heme/Allergies: Negative for environmental allergies and polydipsia. Does not bruise/bleed easily.  Psychiatric/Behavioral: Positive for depression and memory loss. Negative for suicidal ideas, hallucinations and substance abuse. The patient is nervous/anxious. The patient does not have insomnia.      Past Medical History  Diagnosis Date  . AK (actinic keratosis)     right tip of nose and right posterior leg  . Cancer 08/07/12    squamous cell ca,keratoacanthoma-right posterior leg  . Skin cancer 06/2011    scc right elbow tx included excision  . Radiation 09/10/2012-10/24/2012    30 fractions to right posterior calf  . Thyroid disease   . Anemia   . Hypertension   . Varicose vein of leg   . Hemorrhoids   . Spastic colon   . Insomnia, unspecified   . Unspecified urinary incontinence   . Hypoxemia   . Unspecified hypothyroidism   . Abnormality of gait   . Arthritis   . Osteoarthrosis, unspecified whether generalized or localized, unspecified site   . Radiation 09/10/12-10/24/12    Squamous cell  right posterior calf 6000 cGy  . Traumatic closed nondisp torus fracture of distal radial metaphysis 12/08/13   Past Surgical History  Procedure Laterality Date  . Abdominal hysterectomy      early 40's, abdominal  . Appendectomy    . Anterior heel repair    . Total hip arthroplasty  07/31/2010    right  . Pheochromocytoma      left  . Fracture surgery  2003    left   . Wrist fracture surgery  2003    left Dr. Newman Nip   Social History:   reports that she quit smoking about 36 years ago. Her smoking use included Cigarettes. She has a 5 pack-year smoking history. She has never used smokeless tobacco. She reports that she drinks about 1.8 oz of alcohol per week. She reports that she does not use illicit drugs.  Family History  Problem Relation Age of Onset  . Stroke Mother   . Stroke Father     Medications: Patient's Medications  New Prescriptions   No medications on file  Previous Medications   ACETAMINOPHEN (TYLENOL) 325 MG TABLET    Take 2 tablets (650 mg total) by mouth every 4 (four) hours as needed for mild pain (temperature >/= 99.5 F).   ASPIRIN 81 MG TABLET    Take 1 tablet (81 mg total) by mouth daily.   MIRTAZAPINE (REMERON) 30 MG TABLET    Take 30 mg by mouth at bedtime.   OMEGA-3 FATTY ACIDS (FISH OIL) 1000 MG CAPS    Take by mouth. Take one capsule a day   POLYETHYLENE GLYCOL (MIRALAX / GLYCOLAX) PACKET    Take 17 g by mouth daily as needed.  Modified Medications   No medications on file  Discontinued Medications   No medications on file     Physical Exam: Physical Exam  Constitutional: She is oriented to person, place, and time. She appears well-developed and well-nourished. No distress.  HENT:  Head: Normocephalic and atraumatic.  Right Ear: External ear normal.  Left Ear: External ear normal.  Nose: Nose normal.  Mouth/Throat: Oropharynx is clear and moist. No oropharyngeal exudate.  Left hearing normalized per the patient after impacted ear was removed manually.   Eyes: Conjunctivae and EOM are normal. Pupils are equal, round, and reactive to light. Right eye exhibits no discharge. Left eye exhibits no discharge. No scleral icterus.  Neck: Normal range of motion. Neck supple. No JVD present. No tracheal deviation present. No thyromegaly present.  Cardiovascular: Normal rate, regular rhythm, normal heart sounds and intact distal pulses.  Exam reveals no gallop and no  friction rub.   No murmur heard. Pulmonary/Chest: Effort normal. No stridor. No respiratory distress. She has no wheezes. She has rales. She exhibits no tenderness.  Bibasilar dry rales.   Abdominal: Soft. Bowel sounds are normal. She exhibits no distension and no mass. There is no tenderness. There is no rebound and no guarding.  Musculoskeletal: Normal range of motion. She exhibits edema. She exhibits no tenderness.  Trace edema BLE. Slightly decreased ROM of the right wrist from previous fx. Chronic left knee pain with ambulation.   Lymphadenopathy:    She has no cervical adenopathy.  Neurological: She is alert and oriented to person, place, and time. She has normal reflexes. No cranial nerve deficit. She exhibits normal muscle tone. Coordination normal.  Skin: No rash noted. She is not diaphoretic. No erythema. No pallor.  Pigmented BLE from knee down.   Psychiatric: She  has a normal mood and affect. Her behavior is normal. Judgment and thought content normal. Cognition and memory are impaired. She exhibits abnormal recent memory. She exhibits normal remote memory.  Rather pleasant confused.    (type .physexam) Filed Vitals:   11/12/14 1720  BP: 136/86  Pulse: 78  Temp: 97.8 F (36.6 C)  TempSrc: Tympanic  Resp: 20      Labs reviewed: Basic Metabolic Panel:  Recent Labs  12/09/13 05/03/14 11/11/14 1940  NA 141 139 141  K 4.0 4.9 5.1  CL  --   --  104  CO2  --   --  33*  GLUCOSE  --   --  99  BUN 23* 23* 35*  CREATININE 0.6 0.6 0.83  CALCIUM  --   --  9.5   Liver Function Tests:  Recent Labs  11/21/13 05/03/14 11/11/14 1940  AST 19 18 23   ALT 10 8 9   ALKPHOS 74 79 79  BILITOT  --   --  0.5  PROT  --   --  6.8  ALBUMIN  --   --  3.7   CBC:  Recent Labs  12/09/13 05/03/14 11/11/14 1940  WBC 7.7 5.8 4.5  NEUTROABS  --   --  2.6  HGB 11.0* 13.4 13.7  HCT 33* 39 43.4  MCV  --   --  98.9  PLT 267 207 170   Past Procedures:  11/04/2013 CLINICAL DATA:  Fall, weakness EXAM: CHEST - 1 VIEW COMPARISON: IMPRESSION: Stable hyperinflation. No acute process   11/04/2013 CLINICAL DATA: Fall EXAM: CT HEAD WITHOUT CONTRAST CT MAXILLOFACIAL WITHOUT CONTRAST CT CERVICAL SPINE WITHOUT CONTRAST: IMPRESSION: Mild soft tissue swelling overlying the right frontal bone/orbit. No evidence of acute intracranial abnormality. Atrophy with small vessel ischemic changes and intracranial atherosclerosis. No evidence of maxillofacial fracture. No evidence of traumatic injury to the cervical spine. Moderate multilevel degenerative changes.   11/05/13: EEG: IMPRESSION: This is a normal awake EEG  11/15/13 CXR no radiographic evidence of acute pulmonary disease.  12/08/13 X-ray R wrist: a transverse nondisplaced possible incomplete fracture of the distal radial metaphysis  12/08/13 CXR borderline to slightly elevated pulmonary vasculature. The lungs are clear.   12/08/13 X-ray R hand: severe osteopenia, moderate to severe degenerative arthritic changes interphalangeal joints of the fingers and thumb.   01/11/14 X-ray L knee: moderate diffuse osteopenia, no acute fracture, malalignment, or lytic destructive lesion, no knee joint effusion, atherosclerotic vascular calcifications at the posterior soft tissues, minimal osteoarthritis, degenerative calcification at the knee joint involving the menisci.    05/03/14 CXR mild pulmonary venous hypertension w/o other evident of cardiac decompensation findings. No other active cardiopulmonary disease.    Assessment/Plan Constipation Prn MiraLax is adequate.      Hypothyroidism TSH 2.238 11/12/13, not taking thyroid supplement.       Essential hypertension Controlled. Off med    Generalized anxiety disorder Tearful at times, increased confusion, negative UA 11/01/14, increased Mirtazapine to 30mg  10/14/14 due to increased emotional behaviors, staff reported she often flips quickly from happy to sad-? PBA-will taper off Mirtazapine  and try Nuedexta. Observe the patient.     Memory deficit 10/06/14 MMSE 10/30    PBA (pseudobulbar affect) Inappropriate emotions noted and didn't respond to Mirtazapine. Will try Nuedexta-observe.       Family/ Staff Communication: observe the patient.   Goals of Care: AL  Labs/tests ordered: none

## 2014-11-14 NOTE — Assessment & Plan Note (Signed)
TSH 2.238 11/12/13, not taking thyroid supplement.

## 2014-11-14 NOTE — Assessment & Plan Note (Signed)
Controlled. Off med

## 2014-11-14 NOTE — Assessment & Plan Note (Signed)
10/06/14 MMSE 10/30

## 2014-11-14 NOTE — Assessment & Plan Note (Signed)
Prn MiraLax is adequate.  

## 2014-11-14 NOTE — Assessment & Plan Note (Addendum)
Tearful at times, increased confusion, negative UA 11/01/14, increased Mirtazapine to 30mg  10/14/14 due to increased emotional behaviors, staff reported she often flips quickly from happy to sad-? PBA-will taper off Mirtazapine and try Nuedexta. Observe the patient.

## 2014-11-16 DIAGNOSIS — F482 Pseudobulbar affect: Secondary | ICD-10-CM | POA: Insufficient documentation

## 2014-11-16 NOTE — Assessment & Plan Note (Signed)
Inappropriate emotions noted and didn't respond to Mirtazapine. Will try Nuedexta-observe.

## 2015-02-14 DIAGNOSIS — N390 Urinary tract infection, site not specified: Secondary | ICD-10-CM | POA: Diagnosis not present

## 2015-03-10 ENCOUNTER — Non-Acute Institutional Stay: Payer: Medicare Other | Admitting: Family

## 2015-03-10 DIAGNOSIS — R413 Other amnesia: Secondary | ICD-10-CM

## 2015-03-10 DIAGNOSIS — R634 Abnormal weight loss: Secondary | ICD-10-CM

## 2015-03-10 DIAGNOSIS — D638 Anemia in other chronic diseases classified elsewhere: Secondary | ICD-10-CM | POA: Diagnosis not present

## 2015-03-10 DIAGNOSIS — C4490 Unspecified malignant neoplasm of skin, unspecified: Secondary | ICD-10-CM | POA: Diagnosis not present

## 2015-03-10 DIAGNOSIS — K5900 Constipation, unspecified: Secondary | ICD-10-CM

## 2015-03-10 DIAGNOSIS — I10 Essential (primary) hypertension: Secondary | ICD-10-CM

## 2015-03-10 DIAGNOSIS — F482 Pseudobulbar affect: Secondary | ICD-10-CM

## 2015-03-10 NOTE — Progress Notes (Signed)
Patient ID: Carol [name], female   DOB: 10-Aug-1928, 81 y.o.   MRN: [id number]    Ama Room Number: 30  Place of Service: ALF (13)     No Known Allergies  Chief Complaint  Patient presents with  . Annual Exam    HPI:   Loss of weight: Currently weighs 105#, down 16#s since February of this year, when Nuedexta was started. Staff reports fair appetite, still eating majority of meals but is picky. Prefers 'healthier foods.' No current supplements, has had magic cup recently but was d/c'd d/t pt refusal to eat it.   Skin cancer: 3/4" erythematous lesion to right chin, concerning for squamous cell cancer. No pain or tenderness  Memory deficit: Stable  PBA (pseudobulbar affect): Ongoing labile emotions with frequent crying. Initally Remeron was trialed with no improvement. 2/16 Nuedexta was started. Staff report "she's not herself," flat affect, last week had an emotional outburst, and wanted to be left alone for 3 days. She has lost 16#s since starting this medication. Staff report some loss of appetite.  Constipation, unspecified constipation type: controlled with Miralax prn  Essential hypertension: stable, off medications  Anemia of chronic disease: Stable. Last Hgb 13.7 Hct 43.4 MCV 98.9 MCH 31.2 on 11/11/14    Medications: Patient's Medications  New Prescriptions   No medications on file  Previous Medications   ACETAMINOPHEN (TYLENOL) 325 MG TABLET    Take 2 tablets (650 mg total) by mouth every 4 (four) hours as needed for mild pain (temperature >/= 99.5 F).   ASPIRIN 81 MG TABLET    Take 1 tablet (81 mg total) by mouth daily.   DEXTROMETHORPHAN-QUINIDINE 20-10 MG CAPS    Take 1 capsule by mouth 2 (two) times daily.   POLYETHYLENE GLYCOL (MIRALAX / GLYCOLAX) PACKET    Take 17 g by mouth daily as needed.  Modified Medications   No medications on file  Discontinued Medications   MIRTAZAPINE (REMERON) 30 MG TABLET    Take 30 mg by mouth at  bedtime.   OMEGA-3 FATTY ACIDS (FISH OIL) 1000 MG CAPS    Take by mouth. Take one capsule a day     Review of Systems  Constitutional: Negative for chills, diaphoresis and appetite change.  HENT: Negative.   Eyes: Negative.   Respiratory: Negative for cough and chest tightness.   Cardiovascular: Negative for chest pain, palpitations and leg swelling.  Gastrointestinal: Positive for constipation. Negative for nausea, abdominal pain, diarrhea, blood in stool, abdominal distention and rectal pain.  Genitourinary: Negative.   Musculoskeletal: Positive for gait problem.       Using 4 wheel walker with seat.  Skin:       Chronic changes of lower legs with darkening of the skin. Hx SCC of lower leg. Chronic itching. New lesion to right chin.  Neurological: Negative.   Hematological: Negative.   Psychiatric/Behavioral: Negative.     Filed Vitals:   03/10/15 1513  BP: 122/80  Pulse: 87  Temp: 98 F (36.7 C)  Resp: 18  Weight: 105 lb (47.628 kg)   Body mass index is 18.37 kg/(m^2).  Physical Exam  Constitutional: She is oriented to person, place, and time.  frail  Neck: No JVD present. No tracheal deviation present. No thyromegaly present.  Cardiovascular: Normal rate, regular rhythm, normal heart sounds and intact distal pulses.  Exam reveals no gallop and no friction rub.   No murmur heard. Pulmonary/Chest: No respiratory distress. She has no wheezes. She  has rales. She exhibits no tenderness.  Rales in the chest bilaterally.  Abdominal: Bowel sounds are normal. She exhibits no distension and no mass. There is no tenderness. There is no rebound.  Musculoskeletal: Normal range of motion. She exhibits no edema or tenderness.  Unstable gait. Using 4 wheel walker with brakes and seat.  Lymphadenopathy:    She has no cervical adenopathy.  Neurological: She is alert and oriented to person, place, and time. No cranial nerve deficit. Coordination normal.  Memory loss 10/06/14 MMSE  10/30  Skin: No rash noted. No erythema. No pallor.  Lesion over the left scapula removed by Dermatology.  Chronic venous stasis changes of both lower legs. Scaling of skin. 2 cm umbilical erythematous lesion to right chin, suspicious for squamous cell carcinoma  Psychiatric: She has a normal mood and affect. Her behavior is normal. Judgment and thought content normal.     Labs reviewed: No visits with results within 3 Month(s) from this visit. Latest known visit with results is:  Admission on 11/11/2014, Discharged on 11/11/2014  Component Date Value Ref Range Status  . WBC 11/11/2014 4.5  4.0 - 10.5 K/uL Final  . RBC 11/11/2014 4.39  3.87 - 5.11 MIL/uL Final  . Hemoglobin 11/11/2014 13.7  12.0 - 15.0 g/dL Final  . HCT 11/11/2014 43.4  36.0 - 46.0 % Final  . MCV 11/11/2014 98.9  78.0 - 100.0 fL Final  . MCH 11/11/2014 31.2  26.0 - 34.0 pg Final  . MCHC 11/11/2014 31.6  30.0 - 36.0 g/dL Final  . RDW 11/11/2014 13.7  11.5 - 15.5 % Final  . Platelets 11/11/2014 170  150 - 400 K/uL Final  . Neutrophils Relative % 11/11/2014 57  43 - 77 % Final  . Neutro Abs 11/11/2014 2.6  1.7 - 7.7 K/uL Final  . Lymphocytes Relative 11/11/2014 31  12 - 46 % Final  . Lymphs Abs 11/11/2014 1.4  0.7 - 4.0 K/uL Final  . Monocytes Relative 11/11/2014 9  3 - 12 % Final  . Monocytes Absolute 11/11/2014 0.4  0.1 - 1.0 K/uL Final  . Eosinophils Relative 11/11/2014 3  0 - 5 % Final  . Eosinophils Absolute 11/11/2014 0.1  0.0 - 0.7 K/uL Final  . Basophils Relative 11/11/2014 0  0 - 1 % Final  . Basophils Absolute 11/11/2014 0.0  0.0 - 0.1 K/uL Final  . Sodium 11/11/2014 141  135 - 145 mmol/L Final  . Potassium 11/11/2014 5.1  3.5 - 5.1 mmol/L Final  . Chloride 11/11/2014 104  96 - 112 mmol/L Final  . CO2 11/11/2014 33* 19 - 32 mmol/L Final  . Glucose, Bld 11/11/2014 99  70 - 99 mg/dL Final  . BUN 11/11/2014 35* 6 - 23 mg/dL Final  . Creatinine, Ser 11/11/2014 0.83  0.50 - 1.10 mg/dL Final  . Calcium  11/11/2014 9.5  8.4 - 10.5 mg/dL Final  . Total Protein 11/11/2014 6.8  6.0 - 8.3 g/dL Final  . Albumin 11/11/2014 3.7  3.5 - 5.2 g/dL Final  . AST 11/11/2014 23  0 - 37 U/L Final  . ALT 11/11/2014 9  0 - 35 U/L Final  . Alkaline Phosphatase 11/11/2014 79  39 - 117 U/L Final  . Total Bilirubin 11/11/2014 0.5  0.3 - 1.2 mg/dL Final  . GFR calc non Af Amer 11/11/2014 63* >90 mL/min Final  . GFR calc Af Amer 11/11/2014 73* >90 mL/min Final   Comment: (NOTE) The eGFR has been calculated using the  CKD EPI equation. This calculation has not been validated in all clinical situations. eGFR's persistently <90 mL/min signify possible Chronic Kidney Disease.   . Anion gap 11/11/2014 4* 5 - 15 Final     Assessment/Plan 1. Loss of weight Stop Nuedextra Will continue to follow weight  2. Skin cancer Observe  3. Memory deficit Stable  4. PBA (pseudobulbar affect) Stop Nuedextra  5. Constipation, unspecified constipation type Continue Miralax prn  6. Essential hypertension Stable, of meds  7. Anemia of chronic disease Stable, will continue to monitor

## 2015-03-18 ENCOUNTER — Encounter: Payer: Self-pay | Admitting: Nurse Practitioner

## 2015-03-18 ENCOUNTER — Non-Acute Institutional Stay: Payer: Medicare Other | Admitting: Nurse Practitioner

## 2015-03-18 DIAGNOSIS — I10 Essential (primary) hypertension: Secondary | ICD-10-CM | POA: Diagnosis not present

## 2015-03-18 DIAGNOSIS — E039 Hypothyroidism, unspecified: Secondary | ICD-10-CM

## 2015-03-18 DIAGNOSIS — K5900 Constipation, unspecified: Secondary | ICD-10-CM

## 2015-03-18 DIAGNOSIS — R413 Other amnesia: Secondary | ICD-10-CM

## 2015-03-18 DIAGNOSIS — F482 Pseudobulbar affect: Secondary | ICD-10-CM | POA: Diagnosis not present

## 2015-03-18 DIAGNOSIS — N390 Urinary tract infection, site not specified: Secondary | ICD-10-CM | POA: Diagnosis not present

## 2015-03-18 NOTE — Assessment & Plan Note (Signed)
Prn MiraLax available to her.

## 2015-03-18 NOTE — Assessment & Plan Note (Signed)
Off Nuedexta-no efficacy Risperidone 0.125mg  qd for aggressive behaviors.

## 2015-03-18 NOTE — Assessment & Plan Note (Signed)
TSH 2.238 11/12/13, not taking thyroid supplement. Update TSH

## 2015-03-18 NOTE — Assessment & Plan Note (Signed)
Controlled. Off med

## 2015-03-18 NOTE — Progress Notes (Signed)
Patient ID: Carol [name], female   DOB: 1928/01/24, 81 y.o.   MRN: [id number]   Code Status: DNR  No Known Allergies  Chief Complaint  Patient presents with  . Medical Management of Chronic Issues  . Acute Visit    aggression    HPI: Patient is a 81 y.o. female seen in the AL at [facility] today for  evaluation of aggression and other chronic medical conditions Problem List Items Addressed This Visit    Constipation    Prn MiraLax available to her.       Hypothyroidism    TSH 2.238 11/12/13, not taking thyroid supplement. Update TSH        Essential hypertension    Controlled. Off med        Memory deficit - Primary    Aggressive behaviors noted, will manage her symptoms with Risperdal 0.125mg  daily, update CBC, CMP, TSH, UA C/S. Observe the patient.       PBA (pseudobulbar affect)    Off Nuedexta-no efficacy Risperidone 0.125mg  qd for aggressive behaviors.           Review of Systems:  Review of Systems   Past Medical History  Diagnosis Date  . AK (actinic keratosis)     right tip of nose and right posterior leg  . Cancer 08/07/12    squamous cell ca,keratoacanthoma-right posterior leg  . Skin cancer 06/2011    scc right elbow tx included excision  . Radiation 09/10/2012-10/24/2012    30 fractions to right posterior calf  . Thyroid disease   . Anemia   . Hypertension   . Varicose vein of leg   . Hemorrhoids   . Spastic colon   . Insomnia, unspecified   . Unspecified urinary incontinence   . Hypoxemia   . Unspecified hypothyroidism   . Abnormality of gait   . Arthritis   . Osteoarthrosis, unspecified whether generalized or localized, unspecified site   . Radiation 09/10/12-10/24/12    Squamous cell right posterior calf 6000 cGy  . Traumatic closed nondisp torus fracture of distal radial metaphysis 12/08/13   Past Surgical History  Procedure Laterality Date  . Abdominal hysterectomy      early 40's, abdominal  . Appendectomy    . Anterior  heel repair    . Total hip arthroplasty  07/31/2010    right  . Pheochromocytoma      left  . Fracture surgery  2003    left   . Wrist fracture surgery  2003    left Dr. Newman Nip   Social History:   reports that she quit smoking about 36 years ago. Her smoking use included Cigarettes. She has a 5 pack-year smoking history. She has never used smokeless tobacco. She reports that she drinks about 1.8 oz of alcohol per week. She reports that she does not use illicit drugs.  Family History  Problem Relation Age of Onset  . Stroke Mother   . Stroke Father     Medications: Patient's Medications  New Prescriptions   No medications on file  Previous Medications   ACETAMINOPHEN (TYLENOL) 325 MG TABLET    Take 2 tablets (650 mg total) by mouth every 4 (four) hours as needed for mild pain (temperature >/= 99.5 F).   ASPIRIN 81 MG TABLET    Take 1 tablet (81 mg total) by mouth daily.   DEXTROMETHORPHAN-QUINIDINE 20-10 MG CAPS    Take 1 capsule by mouth 2 (two) times daily.   POLYETHYLENE GLYCOL (  MIRALAX / GLYCOLAX) PACKET    Take 17 g by mouth daily as needed.  Modified Medications   No medications on file  Discontinued Medications   No medications on file     Physical Exam: Physical Exam  Filed Vitals:   03/18/15 1101  BP: 148/84  Pulse: 80  Temp: 97.4 F (36.3 C)  TempSrc: Tympanic  Resp: 18      Labs reviewed: Basic Metabolic Panel:  Recent Labs  05/03/14 11/11/14 1940  NA 139 141  K 4.9 5.1  CL  --  104  CO2  --  33*  GLUCOSE  --  99  BUN 23* 35*  CREATININE 0.6 0.83  CALCIUM  --  9.5   Liver Function Tests:  Recent Labs  05/03/14 11/11/14 1940  AST 18 23  ALT 8 9  ALKPHOS 79 79  BILITOT  --  0.5  PROT  --  6.8  ALBUMIN  --  3.7   No results for input(s): LIPASE, AMYLASE in the last 8760 hours. No results for input(s): AMMONIA in the last 8760 hours. CBC:  Recent Labs  05/03/14 11/11/14 1940  WBC 5.8 4.5  NEUTROABS  --  2.6  HGB 13.4  13.7  HCT 39 43.4  MCV  --  98.9  PLT 207 170   Lipid Panel: No results for input(s): CHOL, HDL, LDLCALC, TRIG, CHOLHDL, LDLDIRECT in the last 8760 hours.   Past Procedures:  11/04/2013 CLINICAL DATA: Fall, weakness EXAM: CHEST - 1 VIEW COMPARISON: IMPRESSION: Stable hyperinflation. No acute process   11/04/2013 CLINICAL DATA: Fall EXAM: CT HEAD WITHOUT CONTRAST CT MAXILLOFACIAL WITHOUT CONTRAST CT CERVICAL SPINE WITHOUT CONTRAST: IMPRESSION: Mild soft tissue swelling overlying the right frontal bone/orbit. No evidence of acute intracranial abnormality. Atrophy with small vessel ischemic changes and intracranial atherosclerosis. No evidence of maxillofacial fracture. No evidence of traumatic injury to the cervical spine. Moderate multilevel degenerative changes.   11/05/13: EEG: IMPRESSION: This is a normal awake EEG  11/15/13 CXR no radiographic evidence of acute pulmonary disease.  12/08/13 X-ray R wrist: a transverse nondisplaced possible incomplete fracture of the distal radial metaphysis  12/08/13 CXR borderline to slightly elevated pulmonary vasculature. The lungs are clear.   12/08/13 X-ray R hand: severe osteopenia, moderate to severe degenerative arthritic changes interphalangeal joints of the fingers and thumb.   01/11/14 X-ray L knee: moderate diffuse osteopenia, no acute fracture, malalignment, or lytic destructive lesion, no knee joint effusion, atherosclerotic vascular calcifications at the posterior soft tissues, minimal osteoarthritis, degenerative calcification at the knee joint involving the menisci.    05/03/14 CXR mild pulmonary venous hypertension w/o other evident of cardiac decompensation findings. No other active cardiopulmonary disease.     Assessment/Plan Memory deficit Aggressive behaviors noted, will manage her symptoms with Risperdal 0.125mg  daily, update CBC, CMP, TSH, UA C/S. Observe the patient.   PBA (pseudobulbar affect) Off Nuedexta-no efficacy Risperidone  0.125mg  qd for aggressive behaviors.    Essential hypertension Controlled. Off med    Hypothyroidism TSH 2.238 11/12/13, not taking thyroid supplement. Update TSH    Constipation Prn MiraLax available to her.     Family/ Staff Communication: observe the patient.   Goals of Care: AL  Labs/tests ordered: CBC, CMP, UA C/S, TSH

## 2015-03-18 NOTE — Assessment & Plan Note (Signed)
Aggressive behaviors noted, will manage her symptoms with Risperdal 0.125mg  daily, update CBC, CMP, TSH, UA C/S. Observe the patient.

## 2015-03-21 DIAGNOSIS — R5382 Chronic fatigue, unspecified: Secondary | ICD-10-CM | POA: Diagnosis not present

## 2015-03-21 DIAGNOSIS — Z79899 Other long term (current) drug therapy: Secondary | ICD-10-CM | POA: Diagnosis not present

## 2015-03-21 DIAGNOSIS — E1040 Type 1 diabetes mellitus with diabetic neuropathy, unspecified: Secondary | ICD-10-CM | POA: Diagnosis not present

## 2015-03-21 LAB — CBC AND DIFFERENTIAL
HEMATOCRIT: 45 % (ref 36–46)
Hemoglobin: 14.6 g/dL (ref 12.0–16.0)
Platelets: 213 10*3/uL (ref 150–399)
WBC: 4.7 10^3/mL

## 2015-03-21 LAB — BASIC METABOLIC PANEL
BUN: 21 mg/dL (ref 4–21)
Creatinine: 0.7 mg/dL (ref 0.5–1.1)
GLUCOSE: 86 mg/dL
Potassium: 3.9 mmol/L (ref 3.4–5.3)
Sodium: 141 mmol/L (ref 137–147)

## 2015-03-21 LAB — TSH: TSH: 3.55 u[IU]/mL (ref 0.41–5.90)

## 2015-03-21 LAB — HEPATIC FUNCTION PANEL
ALK PHOS: 80 U/L (ref 25–125)
ALT: 8 U/L (ref 7–35)
AST: 20 U/L (ref 13–35)
Bilirubin, Total: 1 mg/dL

## 2015-03-22 ENCOUNTER — Non-Acute Institutional Stay: Payer: Medicare Other | Admitting: Nurse Practitioner

## 2015-03-22 ENCOUNTER — Encounter: Payer: Self-pay | Admitting: Nurse Practitioner

## 2015-03-22 DIAGNOSIS — D638 Anemia in other chronic diseases classified elsewhere: Secondary | ICD-10-CM | POA: Diagnosis not present

## 2015-03-22 DIAGNOSIS — K5900 Constipation, unspecified: Secondary | ICD-10-CM

## 2015-03-22 DIAGNOSIS — R413 Other amnesia: Secondary | ICD-10-CM | POA: Diagnosis not present

## 2015-03-22 DIAGNOSIS — I10 Essential (primary) hypertension: Secondary | ICD-10-CM | POA: Diagnosis not present

## 2015-03-22 DIAGNOSIS — E039 Hypothyroidism, unspecified: Secondary | ICD-10-CM | POA: Diagnosis not present

## 2015-03-22 DIAGNOSIS — N390 Urinary tract infection, site not specified: Secondary | ICD-10-CM

## 2015-03-22 DIAGNOSIS — F482 Pseudobulbar affect: Secondary | ICD-10-CM

## 2015-03-22 NOTE — Assessment & Plan Note (Signed)
TSH 2.238 11/12/13, TSH 3.554 03/21/15,  not taking thyroid supplement.

## 2015-03-22 NOTE — Assessment & Plan Note (Signed)
10/06/14 MMSE 10/30

## 2015-03-22 NOTE — Assessment & Plan Note (Signed)
Prn MiraLax available to her.

## 2015-03-22 NOTE — Assessment & Plan Note (Signed)
Controlled. Off med

## 2015-03-22 NOTE — Progress Notes (Signed)
Patient ID: Carol [name], female   DOB: 10-15-1927, 81 y.o.   MRN: [id number]   Code Status: DNR  No Known Allergies  Chief Complaint  Patient presents with  . Acute Visit    UTI  . Medical Management of Chronic Issues    HPI: Patient is a 81 y.o. female seen in the AL at [facility] today for  evaluation of UTI and other chronic medical conditions Problem List Items Addressed This Visit    Constipation    Prn MiraLax available to her.        Hypothyroidism    TSH 2.238 11/12/13, TSH 3.554 03/21/15,  not taking thyroid supplement.      Essential hypertension    Controlled. Off med        UTI (urinary tract infection) - Primary    09/24/14 UA cloudy, pos nitrite, large leukocyte esterase, wbc>30, many bacteria. Will empirical ABT-Septra DS bid x 7 days along with FloraStor.  02/19/15 urine culture E coli>100,000c/ml, Nitrofurantoin 100mg  bid x 7 days.        Memory deficit    10/06/14 MMSE 10/30        Anemia of chronic disease    11/11/14 Hgb 13.7 05/03/14 Hgb 13.4 03/21/15 Hgb 14.6       PBA (pseudobulbar affect)    Off Nuedexta-no efficacy         Review of Systems:  Review of Systems  Constitutional: Negative for chills, diaphoresis and appetite change.  HENT: Negative.   Eyes: Negative.   Respiratory: Negative for cough and chest tightness.   Cardiovascular: Negative for chest pain, palpitations and leg swelling.  Gastrointestinal: Positive for constipation. Negative for nausea, abdominal pain, diarrhea, blood in stool, abdominal distention and rectal pain.  Genitourinary: Negative.   Musculoskeletal: Positive for gait problem.       Using 4 wheel walker with seat.  Skin:       Chronic changes of lower legs with darkening of the skin. Hx SCC of lower leg. Chronic itching. New lesion to right chin.  Neurological: Negative.   Hematological: Negative.   Psychiatric/Behavioral: Negative.      Past Medical History  Diagnosis Date  . AK (actinic  keratosis)     right tip of nose and right posterior leg  . Cancer 08/07/12    squamous cell ca,keratoacanthoma-right posterior leg  . Skin cancer 06/2011    scc right elbow tx included excision  . Radiation 09/10/2012-10/24/2012    30 fractions to right posterior calf  . Thyroid disease   . Anemia   . Hypertension   . Varicose vein of leg   . Hemorrhoids   . Spastic colon   . Insomnia, unspecified   . Unspecified urinary incontinence   . Hypoxemia   . Unspecified hypothyroidism   . Abnormality of gait   . Arthritis   . Osteoarthrosis, unspecified whether generalized or localized, unspecified site   . Radiation 09/10/12-10/24/12    Squamous cell right posterior calf 6000 cGy  . Traumatic closed nondisp torus fracture of distal radial metaphysis 12/08/13   Past Surgical History  Procedure Laterality Date  . Abdominal hysterectomy      early 40's, abdominal  . Appendectomy    . Anterior heel repair    . Total hip arthroplasty  07/31/2010    right  . Pheochromocytoma      left  . Fracture surgery  2003    left   . Wrist fracture surgery  2003  left Dr. Newman Nip   Social History:   reports that she quit smoking about 36 years ago. Her smoking use included Cigarettes. She has a 5 pack-year smoking history. She has never used smokeless tobacco. She reports that she drinks about 1.8 oz of alcohol per week. She reports that she does not use illicit drugs.  Family History  Problem Relation Age of Onset  . Stroke Mother   . Stroke Father     Medications: Patient's Medications  New Prescriptions   No medications on file  Previous Medications   ACETAMINOPHEN (TYLENOL) 325 MG TABLET    Take 2 tablets (650 mg total) by mouth every 4 (four) hours as needed for mild pain (temperature >/= 99.5 F).   ASPIRIN 81 MG TABLET    Take 1 tablet (81 mg total) by mouth daily.   DEXTROMETHORPHAN-QUINIDINE 20-10 MG CAPS    Take 1 capsule by mouth 2 (two) times daily.   POLYETHYLENE  GLYCOL (MIRALAX / GLYCOLAX) PACKET    Take 17 g by mouth daily as needed.  Modified Medications   No medications on file  Discontinued Medications   No medications on file     Physical Exam: Physical Exam  Constitutional: She is oriented to person, place, and time.  frail  Neck: No JVD present. No tracheal deviation present. No thyromegaly present.  Cardiovascular: Normal rate, regular rhythm, normal heart sounds and intact distal pulses.  Exam reveals no gallop and no friction rub.   No murmur heard. Pulmonary/Chest: No respiratory distress. She has no wheezes. She has rales. She exhibits no tenderness.  Rales in the chest bilaterally.  Abdominal: Bowel sounds are normal. She exhibits no distension and no mass. There is no tenderness. There is no rebound.  Musculoskeletal: Normal range of motion. She exhibits no edema or tenderness.  Unstable gait. Using 4 wheel walker with brakes and seat.  Lymphadenopathy:    She has no cervical adenopathy.  Neurological: She is alert and oriented to person, place, and time. No cranial nerve deficit. Coordination normal.  Memory loss 10/06/14 MMSE 10/30  Skin: No rash noted. No erythema. No pallor.  Lesion over the left scapula removed by Dermatology.  Chronic venous stasis changes of both lower legs. Scaling of skin. 2 cm umbilical erythematous lesion to right chin, suspicious for squamous cell carcinoma  Psychiatric: She has a normal mood and affect. Her behavior is normal. Judgment and thought content normal.    Filed Vitals:   03/22/15 1603  BP: 148/84  Pulse: 80  Temp: 97.4 F (36.3 C)  TempSrc: Tympanic  Resp: 18      Labs reviewed: Basic Metabolic Panel:  Recent Labs  05/03/14 11/11/14 1940 03/21/15  NA 139 141 141  K 4.9 5.1 3.9  CL  --  104  --   CO2  --  33*  --   GLUCOSE  --  99  --   BUN 23* 35* 21  CREATININE 0.6 0.83 0.7  CALCIUM  --  9.5  --   TSH  --   --  3.55   Liver Function Tests:  Recent Labs   05/03/14 11/11/14 1940 03/21/15  AST 18 23 20   ALT 8 9 8   ALKPHOS 79 79 80  BILITOT  --  0.5  --   PROT  --  6.8  --   ALBUMIN  --  3.7  --    No results for input(s): LIPASE, AMYLASE in the last 8760 hours. No results  for input(s): AMMONIA in the last 8760 hours. CBC:  Recent Labs  05/03/14 11/11/14 1940 03/21/15  WBC 5.8 4.5 4.7  NEUTROABS  --  2.6  --   HGB 13.4 13.7 14.6  HCT 39 43.4 45  MCV  --  98.9  --   PLT 207 170 213   Lipid Panel: No results for input(s): CHOL, HDL, LDLCALC, TRIG, CHOLHDL, LDLDIRECT in the last 8760 hours.   Past Procedures:  11/04/2013 CLINICAL DATA: Fall, weakness EXAM: CHEST - 1 VIEW COMPARISON: IMPRESSION: Stable hyperinflation. No acute process   11/04/2013 CLINICAL DATA: Fall EXAM: CT HEAD WITHOUT CONTRAST CT MAXILLOFACIAL WITHOUT CONTRAST CT CERVICAL SPINE WITHOUT CONTRAST: IMPRESSION: Mild soft tissue swelling overlying the right frontal bone/orbit. No evidence of acute intracranial abnormality. Atrophy with small vessel ischemic changes and intracranial atherosclerosis. No evidence of maxillofacial fracture. No evidence of traumatic injury to the cervical spine. Moderate multilevel degenerative changes.   11/05/13: EEG: IMPRESSION: This is a normal awake EEG  11/15/13 CXR no radiographic evidence of acute pulmonary disease.  12/08/13 X-ray R wrist: a transverse nondisplaced possible incomplete fracture of the distal radial metaphysis  12/08/13 CXR borderline to slightly elevated pulmonary vasculature. The lungs are clear.   12/08/13 X-ray R hand: severe osteopenia, moderate to severe degenerative arthritic changes interphalangeal joints of the fingers and thumb.   01/11/14 X-ray L knee: moderate diffuse osteopenia, no acute fracture, malalignment, or lytic destructive lesion, no knee joint effusion, atherosclerotic vascular calcifications at the posterior soft tissues, minimal osteoarthritis, degenerative calcification at the knee joint involving  the menisci.   05/03/14 CXR mild pulmonary venous hypertension w/o other evident of cardiac decompensation findings. No other active cardiopulmonary disease.   Assessment/Plan UTI (urinary tract infection) 09/24/14 UA cloudy, pos nitrite, large leukocyte esterase, wbc>30, many bacteria. Will empirical ABT-Septra DS bid x 7 days along with FloraStor.  02/19/15 urine culture E coli>100,000c/ml, Nitrofurantoin 100mg  bid x 7 days.    Constipation Prn MiraLax available to her.    Hypothyroidism TSH 2.238 11/12/13, TSH 3.554 03/21/15,  not taking thyroid supplement.  Essential hypertension Controlled. Off med    Memory deficit 10/06/14 MMSE 10/30    Anemia of chronic disease 11/11/14 Hgb 13.7 05/03/14 Hgb 13.4 03/21/15 Hgb 14.6   PBA (pseudobulbar affect) Off Nuedexta-no efficacy    Family/ Staff Communication: observe the patient.   Goals of Care: AL  Labs/tests ordered: none

## 2015-03-22 NOTE — Assessment & Plan Note (Signed)
11/11/14 Hgb 13.7 05/03/14 Hgb 13.4 03/21/15 Hgb 14.6

## 2015-03-22 NOTE — Assessment & Plan Note (Signed)
Off Nuedexta-no efficacy

## 2015-03-22 NOTE — Assessment & Plan Note (Signed)
09/24/14 UA cloudy, pos nitrite, large leukocyte esterase, wbc>30, many bacteria. Will empirical ABT-Septra DS bid x 7 days along with FloraStor.  02/19/15 urine culture E coli>100,000c/ml, Nitrofurantoin 100mg  bid x 7 days.

## 2015-03-28 ENCOUNTER — Other Ambulatory Visit: Payer: Self-pay | Admitting: *Deleted

## 2015-03-28 MED ORDER — POLYETHYLENE GLYCOL 3350 17 G PO PACK: PACK | ORAL | Status: DC

## 2015-03-28 NOTE — Telephone Encounter (Signed)
[facility]

## 2015-03-29 ENCOUNTER — Encounter: Payer: Self-pay | Admitting: Nurse Practitioner

## 2015-03-29 ENCOUNTER — Non-Acute Institutional Stay: Payer: Medicare Other | Admitting: Nurse Practitioner

## 2015-03-29 DIAGNOSIS — E039 Hypothyroidism, unspecified: Secondary | ICD-10-CM | POA: Diagnosis not present

## 2015-03-29 DIAGNOSIS — D638 Anemia in other chronic diseases classified elsewhere: Secondary | ICD-10-CM | POA: Diagnosis not present

## 2015-03-29 DIAGNOSIS — N390 Urinary tract infection, site not specified: Secondary | ICD-10-CM

## 2015-03-29 DIAGNOSIS — I10 Essential (primary) hypertension: Secondary | ICD-10-CM | POA: Diagnosis not present

## 2015-03-29 DIAGNOSIS — R413 Other amnesia: Secondary | ICD-10-CM

## 2015-03-29 DIAGNOSIS — F482 Pseudobulbar affect: Secondary | ICD-10-CM | POA: Diagnosis not present

## 2015-03-29 DIAGNOSIS — K5900 Constipation, unspecified: Secondary | ICD-10-CM

## 2015-03-29 NOTE — Assessment & Plan Note (Signed)
Will have MiraLax 17gm +8 Oz water q2h until good BM, then daily. Observe the patient.

## 2015-03-29 NOTE — Assessment & Plan Note (Signed)
Off Nuedexta-no efficacy

## 2015-03-29 NOTE — Assessment & Plan Note (Signed)
Controlled. Off med

## 2015-03-29 NOTE — Progress Notes (Signed)
Patient ID: Carol [name], female   DOB: Aug 25, 1928, 81 y.o.   MRN: [id number]   Code Status: DNR  No Known Allergies  Chief Complaint  Patient presents with  . Medical Management of Chronic Issues  . Acute Visit    constipation.    HPI: Patient is a 81 y.o. female seen in the AL at [facility] today for  evaluation of constipation and chronic medical conditions Problem List Items Addressed This Visit    Constipation - Primary    Will have MiraLax 17gm +8 Oz water q2h until good BM, then daily. Observe the patient.       Hypothyroidism    03/21/15 TSH 3.554. Not taking thyroid supplement.       Essential hypertension    Controlled. Off med       UTI (urinary tract infection)    Fully treated and asymptomatic UTI.       Memory deficit    10/06/14 MMSE 10/30        Anemia of chronic disease    03/21/15 Hgb 14.6      PBA (pseudobulbar affect)    Off Nuedexta-no efficacy          Review of Systems:  Review of Systems  Constitutional: Negative for chills, diaphoresis and appetite change.  HENT: Negative.   Eyes: Negative.   Respiratory: Negative for cough and chest tightness.   Cardiovascular: Negative for chest pain, palpitations and leg swelling.  Gastrointestinal: Positive for constipation. Negative for nausea, abdominal pain, diarrhea, blood in stool, abdominal distention and rectal pain.  Genitourinary: Negative.   Musculoskeletal: Positive for gait problem.       Using 4 wheel walker with seat.  Skin:       Chronic changes of lower legs with darkening of the skin. Hx SCC of lower leg. Chronic itching. New lesion to right chin.  Neurological: Negative.   Hematological: Negative.   Psychiatric/Behavioral: Negative.      Past Medical History  Diagnosis Date  . AK (actinic keratosis)     right tip of nose and right posterior leg  . Cancer 08/07/12    squamous cell ca,keratoacanthoma-right posterior leg  . Skin cancer 06/2011    scc right elbow  tx included excision  . Radiation 09/10/2012-10/24/2012    30 fractions to right posterior calf  . Thyroid disease   . Anemia   . Hypertension   . Varicose vein of leg   . Hemorrhoids   . Spastic colon   . Insomnia, unspecified   . Unspecified urinary incontinence   . Hypoxemia   . Unspecified hypothyroidism   . Abnormality of gait   . Arthritis   . Osteoarthrosis, unspecified whether generalized or localized, unspecified site   . Radiation 09/10/12-10/24/12    Squamous cell right posterior calf 6000 cGy  . Traumatic closed nondisp torus fracture of distal radial metaphysis 12/08/13   Past Surgical History  Procedure Laterality Date  . Abdominal hysterectomy      early 40's, abdominal  . Appendectomy    . Anterior heel repair    . Total hip arthroplasty  07/31/2010    right  . Pheochromocytoma      left  . Fracture surgery  2003    left   . Wrist fracture surgery  2003    left Dr. Newman Nip   Social History:   reports that she quit smoking about 36 years ago. Her smoking use included Cigarettes. She has a 5 pack-year  smoking history. She has never used smokeless tobacco. She reports that she drinks about 1.8 oz of alcohol per week. She reports that she does not use illicit drugs.  Family History  Problem Relation Age of Onset  . Stroke Mother   . Stroke Father     Medications: Patient's Medications  New Prescriptions   No medications on file  Previous Medications   ACETAMINOPHEN (TYLENOL) 325 MG TABLET    Take 2 tablets (650 mg total) by mouth every 4 (four) hours as needed for mild pain (temperature >/= 99.5 F).   ASPIRIN 81 MG TABLET    Take 1 tablet (81 mg total) by mouth daily.   DEXTROMETHORPHAN-QUINIDINE 20-10 MG CAPS    Take 1 capsule by mouth 2 (two) times daily.   POLYETHYLENE GLYCOL (MIRALAX / GLYCOLAX) PACKET    Fill cap to 17gm mark, mix with 4-8 ounces of fluid and take by mouth every day as needed for constipation  Modified Medications   No  medications on file  Discontinued Medications   No medications on file     Physical Exam: Physical Exam  Constitutional: She is oriented to person, place, and time.  frail  Neck: No JVD present. No tracheal deviation present. No thyromegaly present.  Cardiovascular: Normal rate, regular rhythm, normal heart sounds and intact distal pulses.  Exam reveals no gallop and no friction rub.   No murmur heard. Pulmonary/Chest: No respiratory distress. She has no wheezes. She has rales. She exhibits no tenderness.  Rales in the chest bilaterally.  Abdominal: Bowel sounds are normal. She exhibits no distension and no mass. There is no tenderness. There is no rebound.  Musculoskeletal: Normal range of motion. She exhibits no edema or tenderness.  Unstable gait. Using 4 wheel walker with brakes and seat.  Lymphadenopathy:    She has no cervical adenopathy.  Neurological: She is alert and oriented to person, place, and time. No cranial nerve deficit. Coordination normal.  Memory loss 10/06/14 MMSE 10/30  Skin: No rash noted. No erythema. No pallor.  Lesion over the left scapula removed by Dermatology.  Chronic venous stasis changes of both lower legs. Scaling of skin. 2 cm umbilical erythematous lesion to right chin, suspicious for squamous cell carcinoma  Psychiatric: She has a normal mood and affect. Her behavior is normal. Judgment and thought content normal.    Filed Vitals:   03/29/15 1414  BP: 114/77  Pulse: 79  Temp: 97.9 F (36.6 C)  TempSrc: Tympanic  Resp: 17      Labs reviewed: Basic Metabolic Panel:  Recent Labs  05/03/14 11/11/14 1940 03/21/15  NA 139 141 141  K 4.9 5.1 3.9  CL  --  104  --   CO2  --  33*  --   GLUCOSE  --  99  --   BUN 23* 35* 21  CREATININE 0.6 0.83 0.7  CALCIUM  --  9.5  --   TSH  --   --  3.55   Liver Function Tests:  Recent Labs  05/03/14 11/11/14 1940 03/21/15  AST 18 23 20   ALT 8 9 8   ALKPHOS 79 79 80  BILITOT  --  0.5  --     PROT  --  6.8  --   ALBUMIN  --  3.7  --    No results for input(s): LIPASE, AMYLASE in the last 8760 hours. No results for input(s): AMMONIA in the last 8760 hours. CBC:  Recent Labs  05/03/14 11/11/14  1940 03/21/15  WBC 5.8 4.5 4.7  NEUTROABS  --  2.6  --   HGB 13.4 13.7 14.6  HCT 39 43.4 45  MCV  --  98.9  --   PLT 207 170 213   Lipid Panel: No results for input(s): CHOL, HDL, LDLCALC, TRIG, CHOLHDL, LDLDIRECT in the last 8760 hours.   Past Procedures:  11/04/2013 CLINICAL DATA: Fall, weakness EXAM: CHEST - 1 VIEW COMPARISON: IMPRESSION: Stable hyperinflation. No acute process   11/04/2013 CLINICAL DATA: Fall EXAM: CT HEAD WITHOUT CONTRAST CT MAXILLOFACIAL WITHOUT CONTRAST CT CERVICAL SPINE WITHOUT CONTRAST: IMPRESSION: Mild soft tissue swelling overlying the right frontal bone/orbit. No evidence of acute intracranial abnormality. Atrophy with small vessel ischemic changes and intracranial atherosclerosis. No evidence of maxillofacial fracture. No evidence of traumatic injury to the cervical spine. Moderate multilevel degenerative changes.   11/05/13: EEG: IMPRESSION: This is a normal awake EEG  11/15/13 CXR no radiographic evidence of acute pulmonary disease.  12/08/13 X-ray R wrist: a transverse nondisplaced possible incomplete fracture of the distal radial metaphysis  12/08/13 CXR borderline to slightly elevated pulmonary vasculature. The lungs are clear.   12/08/13 X-ray R hand: severe osteopenia, moderate to severe degenerative arthritic changes interphalangeal joints of the fingers and thumb.   01/11/14 X-ray L knee: moderate diffuse osteopenia, no acute fracture, malalignment, or lytic destructive lesion, no knee joint effusion, atherosclerotic vascular calcifications at the posterior soft tissues, minimal osteoarthritis, degenerative calcification at the knee joint involving the menisci.   05/03/14 CXR mild pulmonary venous hypertension w/o other evident of cardiac  decompensation findings. No other active cardiopulmonary disease.   Assessment/Plan Constipation Will have MiraLax 17gm +8 Oz water q2h until good BM, then daily. Observe the patient.   Hypothyroidism 03/21/15 TSH 3.554. Not taking thyroid supplement.   Essential hypertension Controlled. Off med   UTI (urinary tract infection) Fully treated and asymptomatic UTI.   Memory deficit 10/06/14 MMSE 10/30    Anemia of chronic disease 03/21/15 Hgb 14.6  PBA (pseudobulbar affect) Off Nuedexta-no efficacy     Family/ Staff Communication: observe the patient.   Goals of Care: AL  Labs/tests ordered: none

## 2015-03-29 NOTE — Assessment & Plan Note (Signed)
10/06/14 MMSE 10/30

## 2015-03-29 NOTE — Assessment & Plan Note (Signed)
03/21/15 Hgb 14.6

## 2015-03-29 NOTE — Assessment & Plan Note (Signed)
Fully treated and asymptomatic UTI.

## 2015-03-29 NOTE — Assessment & Plan Note (Signed)
03/21/15 TSH 3.554. Not taking thyroid supplement.

## 2015-04-04 ENCOUNTER — Non-Acute Institutional Stay (INDEPENDENT_AMBULATORY_CARE_PROVIDER_SITE_OTHER): Payer: Medicare Other | Admitting: Family Medicine

## 2015-04-04 DIAGNOSIS — Z593 Problems related to living in residential institution: Secondary | ICD-10-CM

## 2015-04-04 NOTE — Progress Notes (Signed)
Changed PCP to [facility].

## 2015-04-08 DIAGNOSIS — L84 Corns and callosities: Secondary | ICD-10-CM | POA: Diagnosis not present

## 2015-04-08 DIAGNOSIS — M79672 Pain in left foot: Secondary | ICD-10-CM | POA: Diagnosis not present

## 2015-04-08 DIAGNOSIS — M79671 Pain in right foot: Secondary | ICD-10-CM | POA: Diagnosis not present

## 2015-04-08 DIAGNOSIS — L602 Onychogryphosis: Secondary | ICD-10-CM | POA: Diagnosis not present

## 2015-07-12 ENCOUNTER — Encounter: Payer: Medicare Other | Admitting: Internal Medicine

## 2015-07-13 DIAGNOSIS — N390 Urinary tract infection, site not specified: Secondary | ICD-10-CM | POA: Diagnosis not present

## 2015-07-14 DIAGNOSIS — E860 Dehydration: Secondary | ICD-10-CM | POA: Diagnosis not present

## 2015-07-14 DIAGNOSIS — E440 Moderate protein-calorie malnutrition: Secondary | ICD-10-CM | POA: Diagnosis not present

## 2015-08-29 ENCOUNTER — Other Ambulatory Visit: Payer: Self-pay

## 2015-08-29 MED ORDER — POLYETHYLENE GLYCOL 3350 17 G PO PACK: PACK | ORAL | Status: DC

## 2015-08-29 NOTE — Telephone Encounter (Signed)
Fax request from [facility]

## 2015-09-13 ENCOUNTER — Non-Acute Institutional Stay: Payer: Medicare Other | Admitting: Nurse Practitioner

## 2015-09-13 ENCOUNTER — Encounter: Payer: Self-pay | Admitting: Nurse Practitioner

## 2015-09-13 DIAGNOSIS — K5900 Constipation, unspecified: Secondary | ICD-10-CM | POA: Diagnosis not present

## 2015-09-13 DIAGNOSIS — I10 Essential (primary) hypertension: Secondary | ICD-10-CM

## 2015-09-13 DIAGNOSIS — R413 Other amnesia: Secondary | ICD-10-CM | POA: Diagnosis not present

## 2015-09-13 DIAGNOSIS — E039 Hypothyroidism, unspecified: Secondary | ICD-10-CM

## 2015-09-13 DIAGNOSIS — N390 Urinary tract infection, site not specified: Secondary | ICD-10-CM

## 2015-09-13 NOTE — Assessment & Plan Note (Signed)
Stable, continue prn Miralax

## 2015-09-13 NOTE — Assessment & Plan Note (Signed)
Controlled. Off med   

## 2015-09-13 NOTE — Assessment & Plan Note (Signed)
10/06/14 MMSE 10/30, continue Risperdal, update Hgb A1c

## 2015-09-13 NOTE — Assessment & Plan Note (Signed)
03/21/15 TSH 3.554. Not taking thyroid supplement. Update TSH

## 2015-09-13 NOTE — Progress Notes (Signed)
Patient ID: Carol [name], female   DOB: Aug 17, 1928, 81 y.o.   MRN: [id number]  Location:  [facility] Provider:  Marlana Latus NP  Code Status:  DNR Goals of care: Advanced Directive information    Chief Complaint  Patient presents with  . Medical Management of Chronic Issues  . Acute Visit    increased confusion, watery eyes     HPI: Patient is a 81 y.o. female seen in the AL at [facility] today for evaluation of watery eyes, increased confusion, refusal of nurse's assessment, agitated. Denied pain, no noted cough or wheezes or BLE edema. She take low dose Risperdal for behaviors.   Review of Systems:  Review of Systems  Constitutional: Negative for chills and diaphoresis.  HENT: Positive for hearing loss. Negative for congestion, ear discharge and nosebleeds.   Eyes: Negative.  Negative for pain, discharge and redness.       Watery eyes  Respiratory: Negative for cough, sputum production, shortness of breath and wheezing.   Cardiovascular: Negative for chest pain, palpitations and leg swelling.  Gastrointestinal: Positive for constipation. Negative for nausea, abdominal pain, diarrhea and blood in stool.  Genitourinary: Negative.  Negative for dysuria, urgency and flank pain.  Musculoskeletal: Negative for back pain and neck pain.       Using 4 wheel walker with seat.  Skin:       Chronic changes of lower legs with darkening of the skin. Hx SCC of lower leg. Chronic itching.   Neurological: Negative.  Negative for dizziness, tingling, tremors, speech change, focal weakness and headaches.  Psychiatric/Behavioral: Positive for memory loss. Negative for depression, suicidal ideas and substance abuse. The patient is nervous/anxious.        Agitated, confusion, resistance to nurses' assessment.     Past Medical History  Diagnosis Date  . AK (actinic keratosis)     right tip of nose and right posterior leg  . Cancer ([place]) 08/07/12    squamous cell ca,keratoacanthoma-right  posterior leg  . Skin cancer 06/2011    scc right elbow tx included excision  . Radiation 09/10/2012-10/24/2012    30 fractions to right posterior calf  . Thyroid disease   . Anemia   . Hypertension   . Varicose vein of leg   . Hemorrhoids   . Spastic colon   . Insomnia, unspecified   . Unspecified urinary incontinence   . Hypoxemia   . Unspecified hypothyroidism   . Abnormality of gait   . Arthritis   . Osteoarthrosis, unspecified whether generalized or localized, unspecified site   . Radiation 09/10/12-10/24/12    Squamous cell right posterior calf 6000 cGy  . Traumatic closed nondisp torus fracture of distal radial metaphysis 12/08/13    Patient Active Problem List   Diagnosis Date Noted  . Loss of weight 03/10/2015  . Skin cancer 03/10/2015  . PBA (pseudobulbar affect) 11/16/2014  . Left knee pain 01/12/2014  . Traumatic closed nondisp torus fracture of distal radial metaphysis 12/11/2013  . Anemia of chronic disease 12/11/2013  . Contusion of right hand 12/08/2013  . Hypoxemia 11/20/2013  . Memory deficit 11/20/2013  . Abrasion 11/13/2013  . Generalized anxiety disorder 11/13/2013  . Protein-calorie malnutrition, severe ([place]) 11/05/2013  . NSTEMI (non-ST elevated myocardial infarction) ([place]) 11/05/2013  . Soft tissue injury to right hip and suprapubic area 11/05/2013  . Palliative care encounter 11/05/2013  . Weakness generalized 11/05/2013  . UTI (urinary tract infection) 11/04/2013  . Decubitus ulcer 11/04/2013  . Skin  cancer of trunk 08/04/2013  . Venous stasis dermatitis 08/04/2013  . Squamous cell carcinoma of R post calf 12/09/2012  . Constipation 10/30/2012  . Lumbago 10/30/2012  . Abnormality of gait 10/30/2012  . Pain in joint, pelvic region and thigh 10/30/2012  . Unspecified malignant neoplasm of skin of upper limb, including shoulder 10/30/2012  . Unspecified pruritic disorder 10/30/2012  . Hypothyroidism 10/30/2012  . Essential hypertension 10/30/2012    . Varicose veins of lower extremity 10/30/2012  . Hemorrhoids 10/30/2012  . Irritable bowel syndrome 10/30/2012  . Osteoarthrosis, unspecified whether generalized or localized, unspecified site 10/30/2012  . Insomnia 10/30/2012  . Unspecified urinary incontinence 10/30/2012  . AK (actinic keratosis)   . Cancer ([place]) 08/07/2012    No Known Allergies  Medications: Patient's Medications  New Prescriptions   No medications on file  Previous Medications   ACETAMINOPHEN (TYLENOL) 325 MG TABLET    Take 2 tablets (650 mg total) by mouth every 4 (four) hours as needed for mild pain (temperature >/= 99.5 F).   ASPIRIN 81 MG TABLET    Take 1 tablet (81 mg total) by mouth daily.   DEXTROMETHORPHAN-QUINIDINE 20-10 MG CAPS    Take 1 capsule by mouth 2 (two) times daily.   POLYETHYLENE GLYCOL (MIRALAX / GLYCOLAX) PACKET    Fill cap to 17gm mark, mix with 4-8 ounces of fluid and take by mouth every day as needed for constipation  Modified Medications   No medications on file  Discontinued Medications   No medications on file    Physical Exam: Filed Vitals:   09/13/15 1125  BP: 120/80  Pulse: 98  Temp: 97.6 F (36.4 C)  TempSrc: Tympanic  Resp: 19   There is no weight on file to calculate BMI.  Physical Exam  Constitutional: She is oriented to person, place, and time.  frail  Eyes: EOM are normal. Pupils are equal, round, and reactive to light. Right eye exhibits no discharge. Left eye exhibits no discharge. No scleral icterus.  Watery eyes  Neck: No JVD present. No tracheal deviation present. No thyromegaly present.  Cardiovascular: Normal rate, regular rhythm, normal heart sounds and intact distal pulses.  Exam reveals no gallop and no friction rub.   No murmur heard. Pulmonary/Chest: No respiratory distress. She has no wheezes. She has rales. She exhibits no tenderness.  Rales in the chest bilaterally.  Abdominal: Bowel sounds are normal. She exhibits no distension and no mass.  There is no tenderness. There is no rebound.  Musculoskeletal: Normal range of motion. She exhibits no edema or tenderness.  Unstable gait. Using 4 wheel walker with brakes and seat.  Lymphadenopathy:    She has no cervical adenopathy.  Neurological: She is alert and oriented to person, place, and time. No cranial nerve deficit. Coordination normal.  Memory loss 10/06/14 MMSE 10/30  Skin: No rash noted. No erythema. No pallor.  Lesion over the left scapula removed by Dermatology.  Chronic venous stasis changes of both lower legs. Scaling of skin. 2 cm umbilical erythematous lesion to right chin, suspicious for squamous cell carcinoma  Psychiatric:  Confused, agitated, refusal of nurses' assessment.     Labs reviewed: Basic Metabolic Panel:  Recent Labs  11/11/14 1940 03/21/15  NA 141 141  K 5.1 3.9  CL 104  --   CO2 33*  --   GLUCOSE 99  --   BUN 35* 21  CREATININE 0.83 0.7  CALCIUM 9.5  --     Liver Function Tests:  Recent Labs  11/11/14 1940 03/21/15  AST 23 20  ALT 9 8  ALKPHOS 79 80  BILITOT 0.5  --   PROT 6.8  --   ALBUMIN 3.7  --     CBC:  Recent Labs  11/11/14 1940 03/21/15  WBC 4.5 4.7  NEUTROABS 2.6  --   HGB 13.7 14.6  HCT 43.4 45  MCV 98.9  --   PLT 170 213    Lab Results  Component Value Date   TSH 3.55 03/21/2015   No results found for: HGBA1C No results found for: CHOL, HDL, LDLCALC, LDLDIRECT, TRIG, CHOLHDL  Significant Diagnostic Results since last visit: none  Patient Care Team: [name], MD as PCP - General (Internal Medicine) Verdene Lennert, DDS as Consulting Physician (Dentistry) [name], MD as Consulting Physician (Orthopedic Surgery) [place] Kaylena Pacifico X, NP as Nurse Practitioner (Nurse Practitioner)  Assessment/Plan Problem List Items Addressed This Visit    Constipation    Stable, continue prn Miralax      Hypothyroidism    03/21/15 TSH 3.554. Not taking thyroid supplement. Update TSH       Essential hypertension    Controlled. Off med      UTI (urinary tract infection) - Primary    Last treated UTI 07/18/15 urine culture E. Coli, 10 day course of Levaquin 500mg  daily. UA C/S CBC CMP to r/i UTI, empirical ABT Setpra DS bid x 7 days      Memory deficit    10/06/14 MMSE 10/30, continue Risperdal, update Hgb A1c          Family/ staff Communication: monitor for behaviors and symptoms.   Labs/tests ordered: CBC, CMP, TSH, Hgb a1c, UA C/S  [facility] Bellatrix Devonshire NP Geriatrics [facility] [address], [place] [address] On Call:  [phone] & follow prompts after 5pm & weekends Office Phone:  [phone] Office Fax:  [phone]

## 2015-09-13 NOTE — Assessment & Plan Note (Signed)
Last treated UTI 07/18/15 urine culture E. Coli, 10 day course of Levaquin 500mg  daily. UA C/S CBC CMP to r/i UTI, empirical ABT Setpra DS bid x 7 days

## 2015-09-14 DIAGNOSIS — E162 Hypoglycemia, unspecified: Secondary | ICD-10-CM | POA: Diagnosis not present

## 2015-09-14 DIAGNOSIS — R634 Abnormal weight loss: Secondary | ICD-10-CM | POA: Diagnosis not present

## 2015-09-14 DIAGNOSIS — Z79899 Other long term (current) drug therapy: Secondary | ICD-10-CM | POA: Diagnosis not present

## 2015-09-14 DIAGNOSIS — N390 Urinary tract infection, site not specified: Secondary | ICD-10-CM | POA: Diagnosis not present

## 2015-09-14 LAB — BASIC METABOLIC PANEL
BUN: 24 mg/dL — AB (ref 4–21)
CREATININE: 0.7 mg/dL (ref 0.5–1.1)
GLUCOSE: 77 mg/dL
POTASSIUM: 4.5 mmol/L (ref 3.4–5.3)
SODIUM: 140 mmol/L (ref 137–147)

## 2015-09-14 LAB — CBC AND DIFFERENTIAL
HCT: 41 % (ref 36–46)
Hemoglobin: 13.5 g/dL (ref 12.0–16.0)
Platelets: 174 10*3/uL (ref 150–399)
WBC: 4.8 10^3/mL

## 2015-09-14 LAB — TSH: TSH: 4.49 u[IU]/mL (ref 0.41–5.90)

## 2015-09-14 LAB — HEMOGLOBIN A1C: Hgb A1c MFr Bld: 5.9 % (ref 4.0–6.0)

## 2015-09-14 LAB — HEPATIC FUNCTION PANEL
ALT: 6 U/L — AB (ref 7–35)
AST: 19 U/L (ref 13–35)
Alkaline Phosphatase: 73 U/L (ref 25–125)

## 2015-09-16 ENCOUNTER — Other Ambulatory Visit: Payer: Self-pay | Admitting: Nurse Practitioner

## 2015-09-16 DIAGNOSIS — E43 Unspecified severe protein-calorie malnutrition: Secondary | ICD-10-CM

## 2015-09-16 DIAGNOSIS — D638 Anemia in other chronic diseases classified elsewhere: Secondary | ICD-10-CM

## 2015-09-16 DIAGNOSIS — E039 Hypothyroidism, unspecified: Secondary | ICD-10-CM

## 2015-10-06 ENCOUNTER — Encounter: Payer: Self-pay | Admitting: Internal Medicine

## 2015-10-06 ENCOUNTER — Non-Acute Institutional Stay: Payer: Medicare Other | Admitting: Internal Medicine

## 2015-10-06 DIAGNOSIS — F329 Major depressive disorder, single episode, unspecified: Secondary | ICD-10-CM | POA: Insufficient documentation

## 2015-10-06 DIAGNOSIS — F322 Major depressive disorder, single episode, severe without psychotic features: Secondary | ICD-10-CM

## 2015-10-06 MED ORDER — SERTRALINE HCL 50 MG PO TABS: ORAL_TABLET | ORAL | Status: DC

## 2015-10-06 NOTE — Progress Notes (Signed)
Patient ID: Carol [name], female   DOB: 07/03/1928, 82 y.o.   MRN: [id number]    [facility] Room Number: AL 30  Place of Service: ALF (13)     No Known Allergies  Chief Complaint  Patient presents with  . Acute Visit    Increased anger. More difficult to redirect.    HPI:  I was asked to see this patient by the staff today for. She is becoming more of a behavioral problem. She is difficult to redirect. There are angry outburst. She is less social. She seems unhappy most of the time. No evidence of hallucinations or paranoia at this time. No complaints of pain or discomfort.  Medications: Patient's Medications  New Prescriptions   No medications on file  Previous Medications   ACETAMINOPHEN (TYLENOL) 325 MG TABLET    Take 2 tablets (650 mg total) by mouth every 4 (four) hours as needed for mild pain (temperature >/= 99.5 F).   ASPIRIN 81 MG TABLET    Take 1 tablet (81 mg total) by mouth daily.   DEXTROMETHORPHAN-QUINIDINE 20-10 MG CAPS    Take 1 capsule by mouth 2 (two) times daily.   POLYETHYLENE GLYCOL (MIRALAX / GLYCOLAX) PACKET    Fill cap to 17gm mark, mix with 4-8 ounces of fluid and take by mouth every day as needed for constipation   RISPERIDONE (RISPERDAL) 0.25 MG TABLET      Modified Medications   No medications on file  Discontinued Medications   No medications on file     Review of Systems  Constitutional: Negative for chills, diaphoresis and appetite change.  HENT: Negative.   Eyes: Negative.   Respiratory: Negative for cough and chest tightness.   Cardiovascular: Negative for chest pain, palpitations and leg swelling.  Gastrointestinal: Positive for constipation. Negative for nausea, abdominal pain, diarrhea, blood in stool, abdominal distention and rectal pain.  Genitourinary: Negative.   Musculoskeletal: Positive for gait problem.       Using 4 wheel walker with seat.  Skin:       Chronic changes of lower legs with darkening of  the skin. Hx SCC of lower leg. Chronic itching.  Neurological: Negative.   Hematological: Negative.   Psychiatric/Behavioral: Positive for behavioral problems, confusion, dysphoric mood, decreased concentration and agitation. Negative for suicidal ideas and self-injury. The patient is nervous/anxious.     Filed Vitals:   10/06/15 1204  BP: 106/69  Pulse: 88  Temp: 98 F (36.7 C)  Resp: 20  Height: 5' 2"  (1.575 m)  Weight: 107 lb 12.8 oz (48.898 kg)   Wt Readings from Last 3 Encounters:  10/06/15 107 lb 12.8 oz (48.898 kg)  03/10/15 105 lb (47.628 kg)  11/04/13 96 lb 6.4 oz (43.727 kg)    Body mass index is 19.71 kg/(m^2).  Physical Exam  Constitutional: She is oriented to person, place, and time.  frail  Neck: No JVD present. No tracheal deviation present. No thyromegaly present.  Cardiovascular: Normal rate, regular rhythm, normal heart sounds and intact distal pulses.  Exam reveals no gallop and no friction rub.   No murmur heard. Pulmonary/Chest: No respiratory distress. She has no wheezes. She has rales. She exhibits no tenderness.  Rales in the chest bilaterally.  Abdominal: Bowel sounds are normal. She exhibits no distension and no mass. There is no tenderness. There is no rebound.  Musculoskeletal: Normal range of motion. She exhibits no edema or tenderness.  Unstable gait. Using 4 wheel walker with  brakes and seat.  Lymphadenopathy:    She has no cervical adenopathy.  Neurological: She is alert and oriented to person, place, and time. No cranial nerve deficit. Coordination normal.  Memory loss 10/06/14 MMSE 10/30  Skin: No rash noted. No erythema. No pallor.  Lesion over the left scapula removed by Dermatology.  Chronic venous stasis changes of both lower legs. Scaling of skin. 2 cm umbilical erythematous lesion to right chin, suspicious for squamous cell carcinoma  Psychiatric: Her behavior is normal. Judgment and thought content normal.  Fidgety. Angry outbursts  at times. Less social.     Labs reviewed: Lab Summary Latest Ref Rng 09/14/2015 03/21/2015 11/11/2014  Hemoglobin 12.0 - 16.0 g/dL 13.5 14.6 13.7  Hematocrit 36 - 46 % 41 45 43.4  White count - 4.8 4.7 4.5  Platelet count 150 - 399 K/L 174 213 170  Sodium 137 - 147 mmol/L 140 141 141  Potassium 3.4 - 5.3 mmol/L 4.5 3.9 5.1  Calcium 8.4 - 10.5 mg/dL (None) (None) 9.5  Phosphorus - (None) (None) (None)  Creatinine 0.5 - 1.1 mg/dL 0.7 0.7 0.83  AST 13 - 35 U/L 19 20 23   Alk Phos 25 - 125 U/L 73 80 79  Bilirubin 0.3 - 1.2 mg/dL (None) (None) 0.5  Glucose - 77 86 99  Cholesterol - (None) (None) (None)  HDL cholesterol - (None) (None) (None)  Triglycerides - (None) (None) (None)  LDL Direct - (None) (None) (None)  LDL Calc - (None) (None) (None)  Total protein 6.0 - 8.3 g/dL (None) (None) 6.8  Albumin 3.5 - 5.2 g/dL (None) (None) 3.7   Lab Results  Component Value Date   TSH 4.49 09/14/2015   Lab Results  Component Value Date   BUN 24* 09/14/2015   Lab Results  Component Value Date   HGBA1C 5.9 09/14/2015       Assessment/Plan  1. Severe single current episode of major depressive disorder, without psychotic features ([place]) -Start sertraline 50 mg daily

## 2015-11-01 ENCOUNTER — Encounter: Payer: Self-pay | Admitting: Nurse Practitioner

## 2015-11-01 ENCOUNTER — Non-Acute Institutional Stay: Payer: Medicare Other | Admitting: Nurse Practitioner

## 2015-11-01 DIAGNOSIS — E039 Hypothyroidism, unspecified: Secondary | ICD-10-CM

## 2015-11-01 DIAGNOSIS — K5900 Constipation, unspecified: Secondary | ICD-10-CM

## 2015-11-01 DIAGNOSIS — D638 Anemia in other chronic diseases classified elsewhere: Secondary | ICD-10-CM

## 2015-11-01 DIAGNOSIS — S60511A Abrasion of right hand, initial encounter: Secondary | ICD-10-CM | POA: Insufficient documentation

## 2015-11-01 DIAGNOSIS — R413 Other amnesia: Secondary | ICD-10-CM

## 2015-11-01 DIAGNOSIS — F320 Major depressive disorder, single episode, mild: Secondary | ICD-10-CM | POA: Diagnosis not present

## 2015-11-01 DIAGNOSIS — K589 Irritable bowel syndrome without diarrhea: Secondary | ICD-10-CM | POA: Diagnosis not present

## 2015-11-01 DIAGNOSIS — I10 Essential (primary) hypertension: Secondary | ICD-10-CM

## 2015-11-01 NOTE — Assessment & Plan Note (Signed)
10/06/14 MMSE 10/30, continue Risperdal

## 2015-11-01 NOTE — Assessment & Plan Note (Signed)
03/21/15 Hgb 14.6, 09/14/15 Hgb 13.5

## 2015-11-01 NOTE — Assessment & Plan Note (Signed)
Stable, continue prn Miralax

## 2015-11-01 NOTE — Assessment & Plan Note (Signed)
03/21/15 TSH 3.554.09/14/15 TSH 4.493.  Not taking thyroid supplement. 

## 2015-11-01 NOTE — Progress Notes (Signed)
Patient ID: Carol [name], female   DOB: [birth year], 82 y.o.   MRN: [id number]  Location:  [facility] Provider:  Marlana Latus NP  Code Status:  DNR Goals of care: Advanced Directive information    Chief Complaint  Patient presents with  . Medical Management of Chronic Issues  . Acute Visit    a large abrasion @ dorsum of the right hand     HPI: Patient is a 82 y.o. female seen in the AL at [facility] today for evaluation of a large abrasion @ the dorsum of the right hand, not infected. She take low dose Risperdal for behaviors, Sertraline 50mg  since 10/05/16, questionable efficacy so far.   Review of Systems:  Review of Systems  Constitutional: Negative for chills and diaphoresis.  HENT: Positive for hearing loss. Negative for congestion, ear discharge and nosebleeds.   Eyes: Negative.  Negative for pain, discharge and redness.       Watery eyes  Respiratory: Negative for cough, sputum production, shortness of breath and wheezing.   Cardiovascular: Negative for chest pain, palpitations and leg swelling.  Gastrointestinal: Positive for constipation. Negative for nausea, abdominal pain, diarrhea and blood in stool.  Genitourinary: Negative.  Negative for dysuria, urgency and flank pain.  Musculoskeletal: Negative for back pain and neck pain.       Using 4 wheel walker with seat.  Skin:       Chronic changes of lower legs with darkening of the skin. Hx SCC of lower leg. Chronic itching.a large abrasion @ the dorsum of the right hand, not infected    Neurological: Negative.  Negative for dizziness, tingling, tremors, speech change, focal weakness and headaches.  Psychiatric/Behavioral: Positive for memory loss. Negative for depression, suicidal ideas and substance abuse. The patient is nervous/anxious.        Agitated, confusion, resistance to nurses' assessment.     Past Medical History  Diagnosis Date  . AK (actinic keratosis)     right tip of nose and right posterior leg  .  Cancer ([place]) 08/07/12    squamous cell ca,keratoacanthoma-right posterior leg  . Skin cancer 06/2011    scc right elbow tx included excision  . Radiation 09/10/2012-10/24/2012    30 fractions to right posterior calf  . Thyroid disease   . Anemia   . Hypertension   . Varicose vein of leg   . Hemorrhoids   . Spastic colon   . Insomnia, unspecified   . Unspecified urinary incontinence   . Hypoxemia   . Unspecified hypothyroidism   . Abnormality of gait   . Arthritis   . Osteoarthrosis, unspecified whether generalized or localized, unspecified site   . Radiation 09/10/12-10/24/12    Squamous cell right posterior calf 6000 cGy  . Traumatic closed nondisp torus fracture of distal radial metaphysis 12/08/13    Patient Active Problem List   Diagnosis Date Noted  . Abrasion of hand, right 11/01/2015  . Depression, major ([place]) 10/06/2015  . Loss of weight 03/10/2015  . Skin cancer 03/10/2015  . PBA (pseudobulbar affect) 11/16/2014  . Left knee pain 01/12/2014  . Traumatic closed nondisp torus fracture of distal radial metaphysis 12/11/2013  . Anemia of chronic disease 12/11/2013  . Contusion of right hand 12/08/2013  . Memory deficit 11/20/2013  . Abrasion 11/13/2013  . Generalized anxiety disorder 11/13/2013  . NSTEMI (non-ST elevated myocardial infarction) ([place]) 11/05/2013  . Soft tissue injury to right hip and suprapubic area 11/05/2013  . Palliative care encounter 11/05/2013  .  Weakness generalized 11/05/2013  . UTI (urinary tract infection) 11/04/2013  . Decubitus ulcer 11/04/2013  . Skin cancer of trunk 08/04/2013  . Venous stasis dermatitis 08/04/2013  . Squamous cell carcinoma of R post calf 12/09/2012  . Constipation 10/30/2012  . Lumbago 10/30/2012  . Abnormality of gait 10/30/2012  . Pain in joint, pelvic region and thigh 10/30/2012  . Unspecified malignant neoplasm of skin of upper limb, including shoulder 10/30/2012  . Unspecified pruritic disorder 10/30/2012  .  Hypothyroidism 10/30/2012  . Essential hypertension 10/30/2012  . Varicose veins of lower extremity 10/30/2012  . Hemorrhoids 10/30/2012  . Irritable bowel syndrome 10/30/2012  . Osteoarthrosis, unspecified whether generalized or localized, unspecified site 10/30/2012  . Insomnia 10/30/2012  . Unspecified urinary incontinence 10/30/2012  . AK (actinic keratosis)   . Cancer ([place]) 08/07/2012    No Known Allergies  Medications: Patient's Medications  New Prescriptions   No medications on file  Previous Medications   ACETAMINOPHEN (TYLENOL) 325 MG TABLET    Take 2 tablets (650 mg total) by mouth every 4 (four) hours as needed for mild pain (temperature >/= 99.5 F).   ASPIRIN 81 MG TABLET    Take 1 tablet (81 mg total) by mouth daily.   DEXTROMETHORPHAN-QUINIDINE 20-10 MG CAPS    Take 1 capsule by mouth 2 (two) times daily.   POLYETHYLENE GLYCOL (MIRALAX / GLYCOLAX) PACKET    Fill cap to 17gm mark, mix with 4-8 ounces of fluid and take by mouth every day as needed for constipation   RISPERIDONE (RISPERDAL) 0.25 MG TABLET       SERTRALINE (ZOLOFT) 50 MG TABLET    One daily to help depression  Modified Medications   No medications on file  Discontinued Medications   No medications on file    Physical Exam: Filed Vitals:   11/01/15 1431  BP: 126/79  Pulse: 87  Temp: 98.3 F (36.8 C)  TempSrc: Tympanic  Resp: 18   There is no weight on file to calculate BMI.  Physical Exam  Constitutional: She is oriented to person, place, and time.  frail  Eyes: EOM are normal. Pupils are equal, round, and reactive to light. Right eye exhibits no discharge. Left eye exhibits no discharge. No scleral icterus.  Watery eyes  Neck: No JVD present. No tracheal deviation present. No thyromegaly present.  Cardiovascular: Normal rate, regular rhythm, normal heart sounds and intact distal pulses.  Exam reveals no gallop and no friction rub.   No murmur heard. Pulmonary/Chest: No respiratory  distress. She has no wheezes. She has rales. She exhibits no tenderness.  Rales in the chest bilaterally.  Abdominal: Bowel sounds are normal. She exhibits no distension and no mass. There is no tenderness. There is no rebound.  Musculoskeletal: Normal range of motion. She exhibits no edema or tenderness.  Unstable gait. Using 4 wheel walker with brakes and seat.  Lymphadenopathy:    She has no cervical adenopathy.  Neurological: She is alert and oriented to person, place, and time. No cranial nerve deficit. Coordination normal.  Memory loss 10/06/14 MMSE 10/30  Skin: No rash noted. No erythema. No pallor.  Lesion over the left scapula removed by Dermatology.  Chronic venous stasis changes of both lower legs. Scaling of skin. 2 cm umbilical erythematous lesion to right chin, suspicious for squamous cell carcinoma a large abrasion @ the dorsum of the right hand, not infected   Psychiatric:  Confused, agitated, refusal of nurses' assessment.     Labs reviewed:  Basic Metabolic Panel:  Recent Labs  11/11/14 1940 03/21/15 09/14/15  NA 141 141 140  K 5.1 3.9 4.5  CL 104  --   --   CO2 33*  --   --   GLUCOSE 99  --   --   BUN 35* 21 24*  CREATININE 0.83 0.7 0.7  CALCIUM 9.5  --   --     Liver Function Tests:  Recent Labs  11/11/14 1940 03/21/15 09/14/15  AST 23 20 19   ALT 9 8 6*  ALKPHOS 79 80 73  BILITOT 0.5  --   --   PROT 6.8  --   --   ALBUMIN 3.7  --   --     CBC:  Recent Labs  11/11/14 1940 03/21/15 09/14/15  WBC 4.5 4.7 4.8  NEUTROABS 2.6  --   --   HGB 13.7 14.6 13.5  HCT 43.4 45 41  MCV 98.9  --   --   PLT 170 213 174    Lab Results  Component Value Date   TSH 4.49 09/14/2015   Lab Results  Component Value Date   HGBA1C 5.9 09/14/2015   No results found for: CHOL, HDL, LDLCALC, LDLDIRECT, TRIG, CHOLHDL  Significant Diagnostic Results since last visit: none  Patient Care Team: [name], MD as PCP - General (Internal Medicine) Verdene Lennert, DDS as Consulting Physician (Dentistry) [name], MD as Consulting Physician (Orthopedic Surgery) [place] Mast X, NP as Nurse Practitioner (Nurse Practitioner)  Assessment/Plan Problem List Items Addressed This Visit    Memory deficit (Chronic)    10/06/14 MMSE 10/30, continue Risperdal      Irritable bowel syndrome    Stable, continue prn Miralax      Hypothyroidism (Chronic)    03/21/15 TSH 3.554.09/14/15 TSH 4.493.  Not taking thyroid supplement.      Essential hypertension (Chronic)    Controlled. Off med      Depression, major ([place])    Questionable in improving her mood, continue Sertraline 50mg  since 10/06/15, observe the patient.       Constipation    Stable, continue prn Miralax      Anemia of chronic disease    03/21/15 Hgb 14.6, 09/14/15 Hgb 13.5      Abrasion of hand, right - Primary    A large abrasion @ the dorsum of the right hand, not infected, continue cleansing with NS, apply non adhesive dressing until healed.           Family/ staff Communication: monitor for behaviors and symptoms.   Labs/tests ordered: none  ManXie Mast NP Geriatrics [facility] [address], [place] [address] On Call:  [phone] & follow prompts after 5pm & weekends Office Phone:  [phone] Office Fax:  [phone]

## 2015-11-01 NOTE — Assessment & Plan Note (Signed)
Controlled. Off med   

## 2015-11-01 NOTE — Assessment & Plan Note (Signed)
Questionable in improving her mood, continue Sertraline 50mg  since 10/06/15, observe the patient.

## 2015-11-01 NOTE — Assessment & Plan Note (Signed)
A large abrasion @ the dorsum of the right hand, not infected, continue cleansing with NS, apply non adhesive dressing until healed.

## 2015-11-14 DIAGNOSIS — N390 Urinary tract infection, site not specified: Secondary | ICD-10-CM | POA: Diagnosis not present

## 2015-11-15 ENCOUNTER — Non-Acute Institutional Stay: Payer: Medicare Other | Admitting: Nurse Practitioner

## 2015-11-15 ENCOUNTER — Encounter: Payer: Self-pay | Admitting: Nurse Practitioner

## 2015-11-15 DIAGNOSIS — N3000 Acute cystitis without hematuria: Secondary | ICD-10-CM

## 2015-11-15 DIAGNOSIS — F321 Major depressive disorder, single episode, moderate: Secondary | ICD-10-CM | POA: Diagnosis not present

## 2015-11-15 DIAGNOSIS — K5900 Constipation, unspecified: Secondary | ICD-10-CM | POA: Diagnosis not present

## 2015-11-15 DIAGNOSIS — R413 Other amnesia: Secondary | ICD-10-CM

## 2015-11-15 DIAGNOSIS — D638 Anemia in other chronic diseases classified elsewhere: Secondary | ICD-10-CM

## 2015-11-15 DIAGNOSIS — S60511S Abrasion of right hand, sequela: Secondary | ICD-10-CM

## 2015-11-15 DIAGNOSIS — K582 Mixed irritable bowel syndrome: Secondary | ICD-10-CM | POA: Diagnosis not present

## 2015-11-15 DIAGNOSIS — E039 Hypothyroidism, unspecified: Secondary | ICD-10-CM | POA: Diagnosis not present

## 2015-11-15 DIAGNOSIS — I10 Essential (primary) hypertension: Secondary | ICD-10-CM | POA: Diagnosis not present

## 2015-11-15 NOTE — Progress Notes (Signed)
Patient ID: Carol [name], female   DOB: 01/14/1928, 82 y.o.   MRN: [id number]  Location:  [facility] Provider:  Marlana Latus NP  Code Status:  DNR Goals of care: Advanced Directive information Does patient have an advance directive?: Yes, Type of Advance Directive: [place];Out of facility DNR (pink MOST or yellow form)  Chief Complaint  Patient presents with  . Acute Visit    UTI, [place] and Decline in health     HPI: Patient is a 82 y.o. female seen in the AL at [facility] today for evaluation of UTI, malaise, outburst episodes, congested cough, she is afebrile, no O2 desaturation. She take low dose Risperdal for behaviors, Sertraline 50mg  since 10/05/16, questionable efficacy so far.   Review of Systems:  Review of Systems  Constitutional: Negative for chills and diaphoresis.  HENT: Positive for hearing loss. Negative for congestion, ear discharge and nosebleeds.   Eyes: Negative.  Negative for pain, discharge and redness.       Watery eyes  Respiratory: Negative for cough, sputum production, shortness of breath and wheezing.   Cardiovascular: Negative for chest pain, palpitations and leg swelling.  Gastrointestinal: Positive for constipation. Negative for nausea, abdominal pain, diarrhea and blood in stool.  Genitourinary: Negative.  Negative for dysuria, urgency and flank pain.  Musculoskeletal: Negative for back pain and neck pain.       Using 4 wheel walker with seat.  Skin:       Chronic changes of lower legs with darkening of the skin. Hx SCC of lower leg. Chronic itching.a large abrasion @ the dorsum of the right hand, not infected    Neurological: Negative.  Negative for dizziness, tingling, tremors, speech change, focal weakness and headaches.  Psychiatric/Behavioral: Positive for memory loss. Negative for depression, suicidal ideas and substance abuse. The patient is nervous/anxious.        Agitated, confusion, resistance to nurses' assessment.      Past Medical History  Diagnosis Date  . AK (actinic keratosis)     right tip of nose and right posterior leg  . Cancer ([place]) 08/07/12    squamous cell ca,keratoacanthoma-right posterior leg  . Skin cancer 06/2011    scc right elbow tx included excision  . Radiation 09/10/2012-10/24/2012    30 fractions to right posterior calf  . Thyroid disease   . Anemia   . Hypertension   . Varicose vein of leg   . Hemorrhoids   . Spastic colon   . Insomnia, unspecified   . Unspecified urinary incontinence   . Hypoxemia   . Unspecified hypothyroidism   . Abnormality of gait   . Arthritis   . Osteoarthrosis, unspecified whether generalized or localized, unspecified site   . Radiation 09/10/12-10/24/12    Squamous cell right posterior calf 6000 cGy  . Traumatic closed nondisp torus fracture of distal radial metaphysis 12/08/13    Patient Active Problem List   Diagnosis Date Noted  . Abrasion of hand, right 11/01/2015  . Depression, major ([place]) 10/06/2015  . Loss of weight 03/10/2015  . Skin cancer 03/10/2015  . PBA (pseudobulbar affect) 11/16/2014  . Left knee pain 01/12/2014  . Traumatic closed nondisp torus fracture of distal radial metaphysis 12/11/2013  . Anemia of chronic disease 12/11/2013  . Contusion of right hand 12/08/2013  . Memory deficit 11/20/2013  . Abrasion 11/13/2013  . Generalized anxiety disorder 11/13/2013  . NSTEMI (non-ST elevated myocardial infarction) ([place]) 11/05/2013  . Soft tissue injury to right  hip and suprapubic area 11/05/2013  . Palliative care encounter 11/05/2013  . Weakness generalized 11/05/2013  . UTI (urinary tract infection) 11/04/2013  . Decubitus ulcer 11/04/2013  . Skin cancer of trunk 08/04/2013  . Venous stasis dermatitis 08/04/2013  . Squamous cell carcinoma of R post calf 12/09/2012  . Constipation 10/30/2012  . Lumbago 10/30/2012  . Abnormality of gait 10/30/2012  . Pain in joint, pelvic region and thigh 10/30/2012  . Unspecified  malignant neoplasm of skin of upper limb, including shoulder 10/30/2012  . Unspecified pruritic disorder 10/30/2012  . Hypothyroidism 10/30/2012  . Essential hypertension 10/30/2012  . Varicose veins of lower extremity 10/30/2012  . Hemorrhoids 10/30/2012  . Irritable bowel syndrome 10/30/2012  . Osteoarthrosis, unspecified whether generalized or localized, unspecified site 10/30/2012  . Insomnia 10/30/2012  . Unspecified urinary incontinence 10/30/2012  . AK (actinic keratosis)   . Cancer ([place]) 08/07/2012    No Known Allergies  Medications: Patient's Medications  New Prescriptions   No medications on file  Previous Medications   ACETAMINOPHEN (TYLENOL) 325 MG TABLET    Take 2 tablets (650 mg total) by mouth every 4 (four) hours as needed for mild pain (temperature >/= 99.5 F).   AMOXICILLIN (AMOXIL) 500 MG CAPSULE    Take 4 caps by mouth prior to dental procedure   ASPIRIN 81 MG TABLET    Take 1 tablet (81 mg total) by mouth daily.   MULTIPLE VITAMINS-MINERALS (MULTIVITAMIN) TABLET    Take 1 tablet by mouth daily.   POLYETHYLENE GLYCOL (MIRALAX / GLYCOLAX) PACKET    Fill cap to 17gm mark, mix with 4-8 ounces of fluid and take by mouth every day as needed for constipation   RISPERIDONE (RISPERDAL) 0.25 MG TABLET    Take 0.25 mg by mouth at bedtime.    SERTRALINE (ZOLOFT) 50 MG TABLET    One daily to help depression  Modified Medications   No medications on file  Discontinued Medications   DEXTROMETHORPHAN-QUINIDINE 20-10 MG CAPS    Take 1 capsule by mouth 2 (two) times daily. Reported on 11/15/2015    Physical Exam: Filed Vitals:   11/15/15 1019  BP: 152/86  Pulse: 90  Temp: 96.6 F (35.9 C)  TempSrc: Oral  Resp: 18  Height: 5\' 2"  (1.575 m)  Weight: 105 lb (47.628 kg)  SpO2: 89%   Body mass index is 19.2 kg/(m^2).  Physical Exam  Constitutional: She is oriented to person, place, and time.  frail  Eyes: EOM are normal. Pupils are equal, round, and reactive to light.  Right eye exhibits no discharge. Left eye exhibits no discharge. No scleral icterus.  Watery eyes  Neck: No JVD present. No tracheal deviation present. No thyromegaly present.  Cardiovascular: Normal rate, regular rhythm, normal heart sounds and intact distal pulses.  Exam reveals no gallop and no friction rub.   No murmur heard. Pulmonary/Chest: No respiratory distress. She has no wheezes. She has rales. She exhibits no tenderness.  Rales in the chest bilaterally.  Abdominal: Bowel sounds are normal. She exhibits no distension and no mass. There is no tenderness. There is no rebound.  Musculoskeletal: Normal range of motion. She exhibits no edema or tenderness.  Unstable gait. Using 4 wheel walker with brakes and seat.  Lymphadenopathy:    She has no cervical adenopathy.  Neurological: She is alert and oriented to person, place, and time. No cranial nerve deficit. Coordination normal.  Memory loss 10/06/14 MMSE 10/30  Skin: No rash noted. No erythema. No  pallor.  Lesion over the left scapula removed by Dermatology.  Chronic venous stasis changes of both lower legs. Scaling of skin. 2 cm umbilical erythematous lesion to right chin, suspicious for squamous cell carcinoma a large abrasion @ the dorsum of the right hand, not infected   Psychiatric:  Confused, agitated, refusal of nurses' assessment.     Labs reviewed: Basic Metabolic Panel:  Recent Labs  03/21/15 09/14/15  NA 141 140  K 3.9 4.5  BUN 21 24*  CREATININE 0.7 0.7    Liver Function Tests:  Recent Labs  03/21/15 09/14/15  AST 20 19  ALT 8 6*  ALKPHOS 80 73    CBC:  Recent Labs  03/21/15 09/14/15  WBC 4.7 4.8  HGB 14.6 13.5  HCT 45 41  PLT 213 174    Lab Results  Component Value Date   TSH 4.49 09/14/2015   Lab Results  Component Value Date   HGBA1C 5.9 09/14/2015   No results found for: CHOL, HDL, LDLCALC, LDLDIRECT, TRIG, CHOLHDL  Significant Diagnostic Results since last visit:  none  Patient Care Team: [name], MD as PCP - General (Internal Medicine) Verdene Lennert, DDS as Consulting Physician (Dentistry) [name], MD as Consulting Physician (Orthopedic Surgery) [place] Shereece Wellborn X, NP as Nurse Practitioner (Nurse Practitioner)  Assessment/Plan Problem List Items Addressed This Visit    Hypothyroidism (Chronic)    03/21/15 TSH 3.554.09/14/15 TSH 4.493.  Not taking thyroid supplement.      Essential hypertension (Chronic)    Controlled. Off med      Memory deficit (Chronic)    10/06/14 MMSE 10/30, continue Risperdal. Declining in general, weaker, falling, sleepiness during day, her goal of care is comfort, R vs B continuation of Risperdal. Intensive supervision for safety.       Constipation    Stable, continue prn Miralax      Irritable bowel syndrome    Stable, continue prn Miralax      UTI (urinary tract infection) - Primary    11/15/15 urine culture pending, pos nitrite, 2+ leukocyte esterase, wbc>60, moderate bacteria, will started Levaquin 500mg  daily x 7 days in setting of congestive cough. 07/18/15 urine culture E. Coli, 10 day course of Levaquin 500mg  daily.       Anemia of chronic disease    03/21/15 Hgb 14.6, 09/14/15 Hgb 13.5      Depression, major ([place])    Questionable in improving her mood, continue Sertraline 50mg  since 10/06/15, observe the patient.       Abrasion of hand, right    Healing nicely, no s/s of infection.           Family/ staff Communication: monitor for behaviors and symptoms.   Labs/tests ordered: urine culture pending.   [facility] Ridley Dileo NP Geriatrics [facility] [phone], [place] [address] On Call:  [phone] & follow prompts after 5pm & weekends Office Phone:  [phone] Office Fax:  [phone]

## 2015-11-15 NOTE — Assessment & Plan Note (Signed)
10/06/14 MMSE 10/30, continue Risperdal. Declining in general, weaker, falling, sleepiness during day, her goal of care is comfort, R vs B continuation of Risperdal. Intensive supervision for safety.

## 2015-11-15 NOTE — Assessment & Plan Note (Signed)
11/15/15 urine culture pending, pos nitrite, 2+ leukocyte esterase, wbc>60, moderate bacteria, will started Levaquin 500mg  daily x 7 days in setting of congestive cough. 07/18/15 urine culture E. Coli, 10 day course of Levaquin 500mg  daily.

## 2015-11-15 NOTE — Progress Notes (Signed)
Patient ID: Carol [name], female   DOB: 02/15/1928, 82 y.o.   MRN: [id number]

## 2015-11-15 NOTE — Assessment & Plan Note (Signed)
Healing nicely, no s/s of infection.

## 2015-11-15 NOTE — Assessment & Plan Note (Signed)
Controlled. Off med   

## 2015-11-15 NOTE — Assessment & Plan Note (Signed)
03/21/15 Hgb 14.6, 09/14/15 Hgb 13.5

## 2015-11-15 NOTE — Assessment & Plan Note (Signed)
03/21/15 TSH 3.554.09/14/15 TSH 4.493.  Not taking thyroid supplement. 

## 2015-11-15 NOTE — Assessment & Plan Note (Signed)
Questionable in improving her mood, continue Sertraline 50mg  since 10/06/15, observe the patient.

## 2015-11-15 NOTE — Assessment & Plan Note (Signed)
Stable, continue prn Miralax

## 2015-12-09 ENCOUNTER — Non-Acute Institutional Stay: Payer: Medicare Other | Admitting: Nurse Practitioner

## 2015-12-09 ENCOUNTER — Encounter: Payer: Self-pay | Admitting: Nurse Practitioner

## 2015-12-09 DIAGNOSIS — F411 Generalized anxiety disorder: Secondary | ICD-10-CM

## 2015-12-09 DIAGNOSIS — J069 Acute upper respiratory infection, unspecified: Secondary | ICD-10-CM | POA: Diagnosis not present

## 2015-12-09 DIAGNOSIS — R609 Edema, unspecified: Secondary | ICD-10-CM | POA: Diagnosis not present

## 2015-12-09 DIAGNOSIS — R413 Other amnesia: Secondary | ICD-10-CM

## 2015-12-09 NOTE — Assessment & Plan Note (Signed)
Congestive cough, afebrile, no O2 desaturation, denied chest pain or phlegm production, Zpk, observe.

## 2015-12-09 NOTE — Assessment & Plan Note (Signed)
10/06/14 MMSE 10/30, continue Risperdal. Declining in general, weaker, falling, sleepiness during day, her goal of care is comfort, R vs B continuation of Risperdal. Intensive supervision for safety.

## 2015-12-09 NOTE — Assessment & Plan Note (Signed)
Questionable in improving her mood, continue Sertraline 50mg  since 10/06/15, observe the patient.

## 2015-12-09 NOTE — Assessment & Plan Note (Signed)
BLE edema, adding Furosemide 10mg , Spironolactone 12.5mg  daily.

## 2015-12-09 NOTE — Progress Notes (Signed)
Patient ID: Carol [name], female   DOB: 1928/09/07, 82 y.o.   MRN: [id number]  Location:  [place] Room Number: AL 30 Place of Service: AL Provider:  Lennie Odor Faylinn Schwenn NP  [name], MD  Patient Care Team: [name], MD as PCP - General (Internal Medicine) Verdene Lennert, DDS as Consulting Physician (Dentistry) [name], MD as Consulting Physician (Orthopedic Surgery) [place] Isabellarose Kope X, NP as Nurse Practitioner (Nurse Practitioner)  Extended Emergency Contact Information Primary Emergency Contact: [name] of [place] Phone: [phone] Relation: Legal Guardian  Code Status:  DNR Goals of care: Advanced Directive information Advanced Directives 12/09/2015  Does patient have an advance directive? Yes  Type of Advance Directive Living will;Healthcare Power of [place];Out of facility DNR (pink MOST or yellow form)  Does patient want to make changes to advanced directive? No - Patient declined  Copy of advanced directive(s) in chart? Yes     Chief Complaint  Patient presents with  . Acute Visit    productive cough    HPI:  Pt is a 82 y.o. female seen today for medical management of chronic diseases.  Hx of dementia, depression, taking Risperdal and Sertraline. Staff reported congestive cough, increased BLE edema.    Past Medical History  Diagnosis Date  . AK (actinic keratosis)     right tip of nose and right posterior leg  . Cancer ([place]) 08/07/12    squamous cell ca,keratoacanthoma-right posterior leg  . Skin cancer 06/2011    scc right elbow tx included excision  . Radiation 09/10/2012-10/24/2012    30 fractions to right posterior calf  . Thyroid disease   . Anemia   . Hypertension   . Varicose vein of leg   . Hemorrhoids   . Spastic colon   . Insomnia, unspecified   . Unspecified urinary incontinence   . Hypoxemia   . Unspecified hypothyroidism   . Abnormality of gait   . Arthritis   .  Osteoarthrosis, unspecified whether generalized or localized, unspecified site   . Radiation 09/10/12-10/24/12    Squamous cell right posterior calf 6000 cGy  . Traumatic closed nondisp torus fracture of distal radial metaphysis 12/08/13   Past Surgical History  Procedure Laterality Date  . Abdominal hysterectomy      early 40's, abdominal  . Appendectomy    . Anterior heel repair    . Total hip arthroplasty  07/31/2010    right  . Pheochromocytoma      left  . Fracture surgery  2003    left   . Wrist fracture surgery  2003    left Dr. Newman Nip    No Known Allergies    Medication List       This list is accurate as of: 12/09/15  5:07 PM.  Always use your most recent med list.               acetaminophen 325 MG tablet  Commonly known as:  TYLENOL  Take 2 tablets (650 mg total) by mouth every 4 (four) hours as needed for mild pain (temperature >/= 99.5 F).     amoxicillin 500 MG capsule  Commonly known as:  AMOXIL  Take 4 caps by mouth prior to dental procedure     aspirin 81 MG tablet  Take 1 tablet (81 mg total) by mouth daily.     multivitamin tablet  Take 1 tablet by mouth daily.     polyethylene  glycol packet  Commonly known as:  MIRALAX / GLYCOLAX  Fill cap to 17gm mark, mix with 4-8 ounces of fluid and take by mouth every day as needed for constipation     risperiDONE 0.25 MG tablet  Commonly known as:  RISPERDAL  Take 0.25 mg by mouth at bedtime.     sertraline 50 MG tablet  Commonly known as:  ZOLOFT  One daily to help depression        Review of Systems  Constitutional: Negative for chills and diaphoresis.  HENT: Positive for hearing loss. Negative for congestion, ear discharge and nosebleeds.   Eyes: Negative.  Negative for pain, discharge and redness.       Watery eyes  Respiratory: Positive for cough. Negative for shortness of breath and wheezing.   Cardiovascular: Positive for leg swelling. Negative for chest pain and palpitations.    Gastrointestinal: Positive for constipation. Negative for nausea, abdominal pain, diarrhea and blood in stool.  Genitourinary: Negative.  Negative for dysuria, urgency and flank pain.  Musculoskeletal: Negative for back pain and neck pain.       Using 4 wheel walker with seat.  Skin:       Chronic changes of lower legs with darkening of the skin. Hx SCC of lower leg. Chronic itching.a large abrasion @ the dorsum of the right hand, not infected    Neurological: Negative.  Negative for dizziness, tremors and headaches.  Psychiatric/Behavioral: Negative for suicidal ideas. The patient is nervous/anxious.        Agitated, confusion, resistance to nurses' assessment.     Immunization History  Administered Date(s) Administered  . Influenza Split 07/09/2013  . Influenza-Unspecified 07/13/2014, 07/14/2015  . PPD Test 11/17/2013  . Pneumococcal Polysaccharide-23 10/08/1998  . Td 10/08/2005  . Zoster 10/08/2005   Pertinent  Health Maintenance Due  Topic Date Due  . PNA vac Low Risk Adult (2 of 2 - PCV13) 10/09/1999  . INFLUENZA VACCINE  05/08/2016  . DEXA SCAN  Completed   Fall Risk  12/30/2012  Falls in the past year? No   Functional Status Survey:    Filed Vitals:   12/09/15 1352  BP: 140/78  Pulse: 84  Temp: 98.2 F (36.8 C)  TempSrc: Oral  Resp: 21  Height: 5\' 2"  (1.575 m)  Weight: 110 lb (49.896 kg)   Body mass index is 20.11 kg/(m^2). Physical Exam  Constitutional: She is oriented to person, place, and time.  frail  Eyes: EOM are normal. Pupils are equal, round, and reactive to light. Right eye exhibits no discharge. Left eye exhibits no discharge. No scleral icterus.  Watery eyes  Neck: No JVD present. No tracheal deviation present. No thyromegaly present.  Cardiovascular: Normal rate, regular rhythm, normal heart sounds and intact distal pulses.  Exam reveals no gallop and no friction rub.   No murmur heard. Pulmonary/Chest: No respiratory distress. She has no  wheezes. She has rales. She exhibits no tenderness.  Rales in the chest bilaterally.  Abdominal: Bowel sounds are normal. She exhibits no distension and no mass. There is no tenderness. There is no rebound.  Musculoskeletal: Normal range of motion. She exhibits edema. She exhibits no tenderness.  Unstable gait. Using 4 wheel walker with brakes and seat.1+ edema BLE  Lymphadenopathy:    She has no cervical adenopathy.  Neurological: She is alert and oriented to person, place, and time. No cranial nerve deficit. Coordination normal.  Memory loss 10/06/14 MMSE 10/30  Skin: No rash noted. No erythema. No  pallor.  Lesion over the left scapula removed by Dermatology.  Chronic venous stasis changes of both lower legs. Scaling of skin. 2 cm umbilical erythematous lesion to right chin, suspicious for squamous cell carcinoma a large abrasion @ the dorsum of the right hand, not infected   Psychiatric:  Confused, agitated, refusal of nurses' assessment.     Labs reviewed:  Recent Labs  03/21/15 09/14/15  NA 141 140  K 3.9 4.5  BUN 21 24*  CREATININE 0.7 0.7    Recent Labs  03/21/15 09/14/15  AST 20 19  ALT 8 6*  ALKPHOS 80 73    Recent Labs  03/21/15 09/14/15  WBC 4.7 4.8  HGB 14.6 13.5  HCT 45 41  PLT 213 174   Lab Results  Component Value Date   TSH 4.49 09/14/2015   Lab Results  Component Value Date   HGBA1C 5.9 09/14/2015   No results found for: CHOL, HDL, LDLCALC, LDLDIRECT, TRIG, CHOLHDL  Significant Diagnostic Results in last 30 days:  No results found.  Assessment/Plan  Memory deficit 10/06/14 MMSE 10/30, continue Risperdal. Declining in general, weaker, falling, sleepiness during day, her goal of care is comfort, R vs B continuation of Risperdal. Intensive supervision for safety.   Generalized anxiety disorder Questionable in improving her mood, continue Sertraline 50mg  since 10/06/15, observe the patient.   Edema BLE edema, adding Furosemide 10mg ,  Spironolactone 12.5mg  daily.   Acute upper respiratory infection Congestive cough, afebrile, no O2 desaturation, denied chest pain or phlegm production, Zpk, observe.     Family/ staff Communication: continue AL for care needs.   Labs/tests ordered:  none

## 2015-12-16 ENCOUNTER — Encounter: Payer: Self-pay | Admitting: Nurse Practitioner

## 2015-12-16 ENCOUNTER — Non-Acute Institutional Stay: Payer: Medicare Other | Admitting: Nurse Practitioner

## 2015-12-16 DIAGNOSIS — R609 Edema, unspecified: Secondary | ICD-10-CM | POA: Diagnosis not present

## 2015-12-16 DIAGNOSIS — J069 Acute upper respiratory infection, unspecified: Secondary | ICD-10-CM | POA: Diagnosis not present

## 2015-12-16 DIAGNOSIS — F321 Major depressive disorder, single episode, moderate: Secondary | ICD-10-CM | POA: Diagnosis not present

## 2015-12-16 DIAGNOSIS — F482 Pseudobulbar affect: Secondary | ICD-10-CM

## 2015-12-16 DIAGNOSIS — E02 Subclinical iodine-deficiency hypothyroidism: Secondary | ICD-10-CM | POA: Diagnosis not present

## 2015-12-16 DIAGNOSIS — S60221S Contusion of right hand, sequela: Secondary | ICD-10-CM | POA: Diagnosis not present

## 2015-12-16 DIAGNOSIS — D638 Anemia in other chronic diseases classified elsewhere: Secondary | ICD-10-CM

## 2015-12-16 DIAGNOSIS — S60511S Abrasion of right hand, sequela: Secondary | ICD-10-CM

## 2015-12-16 DIAGNOSIS — I10 Essential (primary) hypertension: Secondary | ICD-10-CM

## 2015-12-16 DIAGNOSIS — K5900 Constipation, unspecified: Secondary | ICD-10-CM

## 2015-12-16 DIAGNOSIS — F411 Generalized anxiety disorder: Secondary | ICD-10-CM | POA: Diagnosis not present

## 2015-12-16 DIAGNOSIS — K582 Mixed irritable bowel syndrome: Secondary | ICD-10-CM | POA: Diagnosis not present

## 2015-12-16 DIAGNOSIS — R413 Other amnesia: Secondary | ICD-10-CM

## 2015-12-16 NOTE — Progress Notes (Signed)
Patient ID: Carol [name], female   DOB: May 08, 1928, 82 y.o.   MRN: [id number]  Location:  [place] Room Number: Al 30 Place of Service: [facility] Provider:  Marlana Latus NP  GREEN, Viviann Spare, MD  Patient Care Team: [name], MD as PCP - General (Internal Medicine) Verdene Lennert, DDS as Consulting Physician (Dentistry) [name], MD as Consulting Physician (Orthopedic Surgery) [place] Jesyka Slaght X, NP as Nurse Practitioner (Nurse Practitioner)  Extended Emergency Contact Information Primary Emergency Contact: [name] of [place] Phone: [phone] Relation: Legal Guardian  Code Status:  DNR Goals of care: Advanced Directive information Advanced Directives 12/16/2015  Does patient have an advance directive? Yes  Type of Paramedic of [place];Living will;Out of facility DNR (pink MOST or yellow form)  Does patient want to make changes to advanced directive? -  Copy of advanced directive(s) in chart? Yes     Chief Complaint  Patient presents with  . Agitation    patient is having behavior problems    HPI:  Pt is a 82 y.o. female seen today for medical management of chronic diseases.  Reported increased agitation, becoming combative at times. She take low dose Risperdal for behaviors, Sertraline 50mg  since 10/05/16, questionable efficacy so far.     Past Medical History  Diagnosis Date  . AK (actinic keratosis)     right tip of nose and right posterior leg  . Cancer ([place]) 08/07/12    squamous cell ca,keratoacanthoma-right posterior leg  . Skin cancer 06/2011    scc right elbow tx included excision  . Radiation 09/10/2012-10/24/2012    30 fractions to right posterior calf  . Thyroid disease   . Anemia   . Hypertension   . Varicose vein of leg   . Hemorrhoids   . Spastic colon   . Insomnia, unspecified   . Unspecified urinary incontinence   . Hypoxemia   . Unspecified  hypothyroidism   . Abnormality of gait   . Arthritis   . Osteoarthrosis, unspecified whether generalized or localized, unspecified site   . Radiation 09/10/12-10/24/12    Squamous cell right posterior calf 6000 cGy  . Traumatic closed nondisp torus fracture of distal radial metaphysis 12/08/13   Past Surgical History  Procedure Laterality Date  . Abdominal hysterectomy      early 40's, abdominal  . Appendectomy    . Anterior heel repair    . Total hip arthroplasty  07/31/2010    right  . Pheochromocytoma      left  . Fracture surgery  2003    left   . Wrist fracture surgery  2003    left Dr. Newman Nip    No Known Allergies    Medication List       This list is accurate as of: 12/16/15  3:55 PM.  Always use your most recent med list.               acetaminophen 325 MG tablet  Commonly known as:  TYLENOL  Take 2 tablets (650 mg total) by mouth every 4 (four) hours as needed for mild pain (temperature >/= 99.5 F).     amoxicillin 500 MG capsule  Commonly known as:  AMOXIL  Take 4 caps by mouth prior to dental procedure     aspirin 81 MG tablet  Take 1 tablet (81 mg total) by mouth daily.     multivitamin tablet  Take 1 tablet  by mouth daily.     polyethylene glycol packet  Commonly known as:  MIRALAX / GLYCOLAX  Fill cap to 17gm mark, mix with 4-8 ounces of fluid and take by mouth every day as needed for constipation     risperiDONE 0.25 MG tablet  Commonly known as:  RISPERDAL  Take 0.5 mg by mouth at bedtime.     sertraline 50 MG tablet  Commonly known as:  ZOLOFT  One daily to help depression        Review of Systems  Constitutional: Negative for chills and diaphoresis.  HENT: Positive for hearing loss. Negative for congestion, ear discharge and nosebleeds.   Eyes: Negative.  Negative for pain, discharge and redness.       Watery eyes  Respiratory: Negative for cough, shortness of breath and wheezing.   Cardiovascular: Positive for leg swelling.  Negative for chest pain and palpitations.  Gastrointestinal: Positive for constipation. Negative for nausea, abdominal pain, diarrhea and blood in stool.  Genitourinary: Negative.  Negative for dysuria, urgency and flank pain.  Musculoskeletal: Negative for back pain and neck pain.       Using 4 wheel walker with seat.  Skin:       Chronic changes of lower legs with darkening of the skin. Hx SCC of lower leg. Chronic itching.a large abrasion @ the dorsum of the right hand-healed.     Neurological: Negative.  Negative for dizziness, tremors and headaches.  Psychiatric/Behavioral: Positive for behavioral problems, confusion and agitation. Negative for suicidal ideas. The patient is nervous/anxious.        Agitated, confusion, resistance to nurses' assessment.     Immunization History  Administered Date(s) Administered  . Influenza Split 07/09/2013  . Influenza-Unspecified 07/13/2014, 07/14/2015  . PPD Test 11/17/2013  . Pneumococcal Polysaccharide-23 10/08/1998  . Td 10/08/2005  . Zoster 10/08/2005   Pertinent  Health Maintenance Due  Topic Date Due  . PNA vac Low Risk Adult (2 of 2 - PCV13) 10/09/1999  . INFLUENZA VACCINE  05/08/2016  . DEXA SCAN  Completed   Fall Risk  12/30/2012  Falls in the past year? No   Functional Status Survey:    Filed Vitals:   12/16/15 1058  BP: 99/72  Pulse: 82  Temp: 97.7 F (36.5 C)  TempSrc: Oral  Resp: 20  Height: 5\' 2"  (1.575 m)  Weight: 106 lb 3.2 oz (48.172 kg)   Body mass index is 19.42 kg/(m^2). Physical Exam  Constitutional: She is oriented to person, place, and time.  frail  Eyes: EOM are normal. Pupils are equal, round, and reactive to light. Right eye exhibits no discharge. Left eye exhibits no discharge. No scleral icterus.  Watery eyes  Neck: No JVD present. No tracheal deviation present. No thyromegaly present.  Cardiovascular: Normal rate, regular rhythm, normal heart sounds and intact distal pulses.  Exam reveals no  gallop and no friction rub.   No murmur heard. Pulmonary/Chest: No respiratory distress. She has no wheezes. She has rales. She exhibits no tenderness.  Rales in the chest bilaterally.  Abdominal: Bowel sounds are normal. She exhibits no distension and no mass. There is no tenderness. There is no rebound.  Musculoskeletal: Normal range of motion. She exhibits edema. She exhibits no tenderness.  Unstable gait. Using 4 wheel walker with brakes and seat.1+ edema BLE  Lymphadenopathy:    She has no cervical adenopathy.  Neurological: She is alert and oriented to person, place, and time. No cranial nerve deficit. Coordination normal.  Memory loss 10/06/14 MMSE 10/30  Skin: No rash noted. No erythema. No pallor.  Lesion over the left scapula removed by Dermatology.  Chronic venous stasis changes of both lower legs. Scaling of skin. 2 cm umbilical erythematous lesion to right chin, suspicious for squamous cell carcinoma a large abrasion @ the dorsum of the right hand, healed.    Psychiatric:  Confused, agitated, refusal of nurses' assessment.     Labs reviewed:  Recent Labs  03/21/15 09/14/15  NA 141 140  K 3.9 4.5  BUN 21 24*  CREATININE 0.7 0.7    Recent Labs  03/21/15 09/14/15  AST 20 19  ALT 8 6*  ALKPHOS 80 73    Recent Labs  03/21/15 09/14/15  WBC 4.7 4.8  HGB 14.6 13.5  HCT 45 41  PLT 213 174   Lab Results  Component Value Date   TSH 4.49 09/14/2015   Lab Results  Component Value Date   HGBA1C 5.9 09/14/2015   No results found for: CHOL, HDL, LDLCALC, LDLDIRECT, TRIG, CHOLHDL  Significant Diagnostic Results in last 30 days:  No results found.  Assessment/Plan  Abrasion of hand, right Dorsum of the right hand, healed.   Acute upper respiratory infection Resolved congestive cough, afebrile, no O2 desaturation, denied chest pain or phlegm production, Zpk, observe.   Anemia of chronic disease 03/21/15 Hgb 14.6, 09/14/15 Hgb 13.5  Constipation Stable,  continue prn Miralax  Contusion of right hand Healed.   Depression, major ([place]) Questionable in improving her mood, dc Sertraline 50mg  since 10/06/15, trial of Buspirone 7.5mg  bid, observe the patient.  Edema Improved BLE edema, continue Furosemide 10mg , Spironolactone 12.5mg  daily. Update CMP  Essential hypertension Controlled.   Generalized anxiety disorder Questionable in improving her mood, dc Sertraline 50mg  since 10/06/15, trial of Buspirone 7.5mg  bid, observe the patient.   Hypothyroidism 03/21/15 TSH 3.554.09/14/15 TSH 4.493.  Not taking thyroid supplement.  Irritable bowel syndrome Stable, continue prn Miralax  Memory deficit 10/06/14 MMSE 10/30, dc Risperdal. Declining in general, weaker, falling, sleepiness during day, persisted combative behaviors and agitation. Trial of Buspirone.   PBA (pseudobulbar affect) Persisted agitation, combative, anxiety, will dc Risperdal and Sertraline, trial of Buspirone.     Family/ staff Communication: continue to monitor the patient's behaviors and safety.   Labs/tests ordered:  CMP

## 2015-12-16 NOTE — Assessment & Plan Note (Signed)
Controlled.  

## 2015-12-16 NOTE — Assessment & Plan Note (Signed)
Resolved congestive cough, afebrile, no O2 desaturation, denied chest pain or phlegm production, Zpk, observe.

## 2015-12-16 NOTE — Assessment & Plan Note (Signed)
03/21/15 Hgb 14.6, 09/14/15 Hgb 13.5

## 2015-12-16 NOTE — Assessment & Plan Note (Signed)
Stable, continue prn Miralax

## 2015-12-16 NOTE — Assessment & Plan Note (Signed)
03/21/15 TSH 3.554.09/14/15 TSH 4.493.  Not taking thyroid supplement. 

## 2015-12-16 NOTE — Assessment & Plan Note (Addendum)
10/06/14 MMSE 10/30, dc Risperdal. Declining in general, weaker, falling, sleepiness during day, persisted combative behaviors and agitation. Trial of Buspirone.

## 2015-12-16 NOTE — Assessment & Plan Note (Signed)
Questionable in improving her mood, dc Sertraline 50mg  since 10/06/15, trial of Buspirone 7.5mg  bid, observe the patient.

## 2015-12-16 NOTE — Assessment & Plan Note (Signed)
Persisted agitation, combative, anxiety, will dc Risperdal and Sertraline, trial of Buspirone.

## 2015-12-16 NOTE — Assessment & Plan Note (Signed)
Dorsum of the right hand, healed.

## 2015-12-16 NOTE — Assessment & Plan Note (Signed)
Healed

## 2015-12-16 NOTE — Assessment & Plan Note (Signed)
Improved BLE edema, continue Furosemide 10mg , Spironolactone 12.5mg  daily. Update CMP

## 2015-12-19 DIAGNOSIS — I10 Essential (primary) hypertension: Secondary | ICD-10-CM | POA: Diagnosis not present

## 2015-12-19 LAB — BASIC METABOLIC PANEL
BUN: 21 mg/dL (ref 4–21)
Creatinine: 0.6 mg/dL (ref 0.5–1.1)
Glucose: 84 mg/dL
Potassium: 4.8 mmol/L (ref 3.4–5.3)
Sodium: 139 mmol/L (ref 137–147)

## 2015-12-19 LAB — HEPATIC FUNCTION PANEL
ALT: 6 U/L — AB (ref 7–35)
AST: 26 U/L (ref 13–35)
Alkaline Phosphatase: 69 U/L (ref 25–125)
Bilirubin, Total: 0.6 mg/dL

## 2016-01-10 ENCOUNTER — Encounter: Payer: Self-pay | Admitting: Nurse Practitioner

## 2016-01-10 ENCOUNTER — Non-Acute Institutional Stay: Payer: Medicare Other | Admitting: Nurse Practitioner

## 2016-01-10 DIAGNOSIS — R609 Edema, unspecified: Secondary | ICD-10-CM

## 2016-01-10 DIAGNOSIS — D638 Anemia in other chronic diseases classified elsewhere: Secondary | ICD-10-CM

## 2016-01-10 DIAGNOSIS — I10 Essential (primary) hypertension: Secondary | ICD-10-CM

## 2016-01-10 DIAGNOSIS — F320 Major depressive disorder, single episode, mild: Secondary | ICD-10-CM | POA: Diagnosis not present

## 2016-01-10 DIAGNOSIS — R413 Other amnesia: Secondary | ICD-10-CM

## 2016-01-10 DIAGNOSIS — K5900 Constipation, unspecified: Secondary | ICD-10-CM

## 2016-01-10 NOTE — Assessment & Plan Note (Signed)
03/21/15 Hgb 14.6, 09/14/15 Hgb 13.5. Update CBC

## 2016-01-10 NOTE — Assessment & Plan Note (Signed)
Stable, continue MiraLax daily prn.

## 2016-01-10 NOTE — Assessment & Plan Note (Addendum)
Worse, 2+ BLE, stasis dermatitis seen, mild erythema, warmth, and pigmented areas 1/3 lower legs. Gentle diurese with Furosemide 20mg , Spironolactone 25mg  daily, update CBC, CMP, BNP. Apply Mycolog II cream bid.

## 2016-01-10 NOTE — Assessment & Plan Note (Signed)
2/30/15 MMSE 10/30, off Risperdal. Declining in general, weaker, falling, sleepiness during day, persisted combative behaviors and agitation. Continue Buspirone.

## 2016-01-10 NOTE — Progress Notes (Signed)
Patient ID: Carol [name], female   DOB: [birth year], 82 y.o.   MRN: [id number]  Location:  [place] Room Number: AL 30 Place of Service: [facility] Provider:  Lennie Odor Akshita Italiano NP  [name], MD  Patient Care Team: [name], MD as PCP - General (Internal Medicine) Verdene Lennert, DDS as Consulting Physician (Dentistry) [name], MD as Consulting Physician (Orthopedic Surgery) [place] Finley Chevez X, NP as Nurse Practitioner (Nurse Practitioner)  Extended Emergency Contact Information Primary Emergency Contact: [name] of [place] Phone: [phone] Relation: Legal Guardian  Code Status:  DNR Goals of care: Advanced Directive information Advanced Directives 01/10/2016  Does patient have an advance directive? Yes  Type of Paramedic of [place];Living will;Out of facility DNR (pink MOST or yellow form)  Does patient want to make changes to advanced directive? No - Patient declined  Copy of advanced directive(s) in chart? Yes     Chief Complaint  Patient presents with  . Edema    swelling in legs    HPI:  Pt is a 82 y.o. female seen today for medical management of chronic diseases. No significant improvement of agitation, combative behaviors since started Buspar and off Risperdal and Sertraline 50mg . The patient has increased BLE edema 2+, no apparent increased SOB, sputum production, or O2 desaturation. She uses prn MiraLax for constipation.     Past Medical History  Diagnosis Date  . AK (actinic keratosis)     right tip of nose and right posterior leg  . Cancer ([place]) 08/07/12    squamous cell ca,keratoacanthoma-right posterior leg  . Skin cancer 06/2011    scc right elbow tx included excision  . Radiation 09/10/2012-10/24/2012    30 fractions to right posterior calf  . Thyroid disease   . Anemia   . Hypertension   . Varicose vein of leg   . Hemorrhoids   . Spastic colon   .  Insomnia, unspecified   . Unspecified urinary incontinence   . Hypoxemia   . Unspecified hypothyroidism   . Abnormality of gait   . Arthritis   . Osteoarthrosis, unspecified whether generalized or localized, unspecified site   . Radiation 09/10/12-10/24/12    Squamous cell right posterior calf 6000 cGy  . Traumatic closed nondisp torus fracture of distal radial metaphysis 12/08/13   Past Surgical History  Procedure Laterality Date  . Abdominal hysterectomy      early 40's, abdominal  . Appendectomy    . Anterior heel repair    . Total hip arthroplasty  07/31/2010    right  . Pheochromocytoma      left  . Fracture surgery  2003    left   . Wrist fracture surgery  2003    left Dr. Newman Nip    No Known Allergies    Medication List       This list is accurate as of: 01/10/16  2:25 PM.  Always use your most recent med list.               acetaminophen 325 MG tablet  Commonly known as:  TYLENOL  Take 2 tablets (650 mg total) by mouth every 4 (four) hours as needed for mild pain (temperature >/= 99.5 F).     amoxicillin 500 MG capsule  Commonly known as:  AMOXIL  Take 4 caps by mouth prior to dental procedure     aspirin 81 MG tablet  Take 1 tablet (  81 mg total) by mouth daily.     busPIRone 7.5 MG tablet  Commonly known as:  BUSPAR  Take 7.5 mg by mouth 2 (two) times daily.     multivitamin tablet  Take 1 tablet by mouth daily.     polyethylene glycol packet  Commonly known as:  MIRALAX / GLYCOLAX  Fill cap to 17gm mark, mix with 4-8 ounces of fluid and take by mouth every day as needed for constipation        Review of Systems  Constitutional: Negative for chills and diaphoresis.  HENT: Positive for hearing loss. Negative for congestion, ear discharge and nosebleeds.   Eyes: Negative.  Negative for pain, discharge and redness.       Watery eyes  Respiratory: Negative for cough, shortness of breath and wheezing.   Cardiovascular: Positive for leg  swelling. Negative for chest pain and palpitations.  Gastrointestinal: Positive for constipation. Negative for nausea, abdominal pain, diarrhea and blood in stool.  Genitourinary: Negative.  Negative for dysuria, urgency and flank pain.  Musculoskeletal: Negative for back pain and neck pain.       Using 4 wheel walker with seat.  Skin:       Chronic changes of lower legs with darkening of the skin. Hx SCC of lower leg. Chronic itching.a large abrasion @ the dorsum of the right hand-healed.     Neurological: Negative.  Negative for dizziness, tremors and headaches.  Psychiatric/Behavioral: Positive for behavioral problems, confusion and agitation. Negative for suicidal ideas. The patient is nervous/anxious.        Agitated, confusion, resistance to nurses' assessment.     Immunization History  Administered Date(s) Administered  . Influenza Split 07/09/2013  . Influenza-Unspecified 07/13/2014, 07/14/2015  . PPD Test 11/17/2013  . Pneumococcal Polysaccharide-23 10/08/1998  . Td 10/08/2005  . Zoster 10/08/2005   Pertinent  Health Maintenance Due  Topic Date Due  . PNA vac Low Risk Adult (2 of 2 - PCV13) 10/09/1999  . INFLUENZA VACCINE  05/08/2016  . DEXA SCAN  Completed   Fall Risk  12/30/2012  Falls in the past year? No   Functional Status Survey:    Filed Vitals:   01/10/16 1334  BP: 114/84  Pulse: 78  Temp: 95.5 F (35.3 C)  TempSrc: Oral  Resp: 18  Height: 5\' 2"  (1.575 m)  Weight: 106 lb 3.2 oz (48.172 kg)   Body mass index is 19.42 kg/(m^2). Physical Exam  Constitutional: She is oriented to person, place, and time.  frail  Eyes: EOM are normal. Pupils are equal, round, and reactive to light. Right eye exhibits no discharge. Left eye exhibits no discharge. No scleral icterus.  Watery eyes  Neck: No JVD present. No tracheal deviation present. No thyromegaly present.  Cardiovascular: Normal rate, regular rhythm, normal heart sounds and intact distal pulses.  Exam  reveals no gallop and no friction rub.   No murmur heard. Pulmonary/Chest: No respiratory distress. She has no wheezes. She has rales. She exhibits no tenderness.  Rales in the chest bilaterally.  Abdominal: Bowel sounds are normal. She exhibits no distension and no mass. There is no tenderness. There is no rebound.  Musculoskeletal: Normal range of motion. She exhibits edema. She exhibits no tenderness.  Unstable gait. Using 4 wheel walker with brakes and seat.2+ edema BLE  Lymphadenopathy:    She has no cervical adenopathy.  Neurological: She is alert and oriented to person, place, and time. No cranial nerve deficit. Coordination normal.  Memory loss  10/06/14 MMSE 10/30  Skin: No rash noted. No erythema. No pallor.  Lesion over the left scapula removed by Dermatology.  Chronic venous stasis changes of both lower legs. Scaling of skin. 2 cm umbilical erythematous lesion to right chin, suspicious for squamous cell carcinoma a large abrasion @ the dorsum of the right hand, healed.    Psychiatric:  Confused, agitated, refusal of nurses' assessment.     Labs reviewed:  Recent Labs  03/21/15 09/14/15 12/19/15  NA 141 140 139  K 3.9 4.5 4.8  BUN 21 24* 21  CREATININE 0.7 0.7 0.6    Recent Labs  03/21/15 09/14/15 12/19/15  AST 20 19 26   ALT 8 6* 6*  ALKPHOS 80 73 69    Recent Labs  03/21/15 09/14/15  WBC 4.7 4.8  HGB 14.6 13.5  HCT 45 41  PLT 213 174   Lab Results  Component Value Date   TSH 4.49 09/14/2015   Lab Results  Component Value Date   HGBA1C 5.9 09/14/2015   No results found for: CHOL, HDL, LDLCALC, LDLDIRECT, TRIG, CHOLHDL  Significant Diagnostic Results in last 30 days:  No results found.  Assessment/Plan  Edema Worse, 2+ BLE, stasis dermatitis seen, mild erythema, warmth, and pigmented areas 1/3 lower legs. Gentle diurese with Furosemide 20mg , Spironolactone 25mg  daily, update CBC, CMP, BNP. Apply Mycolog II cream bid.   Essential  hypertension Controlled.   Anemia of chronic disease 03/21/15 Hgb 14.6, 09/14/15 Hgb 13.5. Update CBC  Constipation Stable, continue MiraLax daily prn.   Depression, major ([place]) Questionable in improving her mood, continue to be off Sertraline 50mg , continue Buspirone 7.5mg  bid, observe the patient.   Memory deficit 2/30/15 MMSE 10/30, off Risperdal. Declining in general, weaker, falling, sleepiness during day, persisted combative behaviors and agitation. Continue Buspirone.      Family/ staff Communication: continue to monitor the patient's behaviors and safety.   Labs/tests ordered:  CBC, CMP, BNP

## 2016-01-10 NOTE — Assessment & Plan Note (Signed)
Questionable in improving her mood, continue to be off Sertraline 50mg , continue Buspirone 7.5mg  bid, observe the patient.

## 2016-01-10 NOTE — Assessment & Plan Note (Signed)
Controlled.  

## 2016-01-26 DIAGNOSIS — R296 Repeated falls: Secondary | ICD-10-CM | POA: Diagnosis not present

## 2016-01-26 DIAGNOSIS — R2681 Unsteadiness on feet: Secondary | ICD-10-CM | POA: Diagnosis not present

## 2016-01-26 DIAGNOSIS — R293 Abnormal posture: Secondary | ICD-10-CM | POA: Diagnosis not present

## 2016-01-27 DIAGNOSIS — L602 Onychogryphosis: Secondary | ICD-10-CM | POA: Diagnosis not present

## 2016-01-27 DIAGNOSIS — L84 Corns and callosities: Secondary | ICD-10-CM | POA: Diagnosis not present

## 2016-01-27 DIAGNOSIS — M79671 Pain in right foot: Secondary | ICD-10-CM | POA: Diagnosis not present

## 2016-01-27 DIAGNOSIS — M79672 Pain in left foot: Secondary | ICD-10-CM | POA: Diagnosis not present

## 2016-01-30 DIAGNOSIS — R293 Abnormal posture: Secondary | ICD-10-CM | POA: Diagnosis not present

## 2016-01-30 DIAGNOSIS — R296 Repeated falls: Secondary | ICD-10-CM | POA: Diagnosis not present

## 2016-01-30 DIAGNOSIS — R2681 Unsteadiness on feet: Secondary | ICD-10-CM | POA: Diagnosis not present

## 2016-01-31 DIAGNOSIS — R293 Abnormal posture: Secondary | ICD-10-CM | POA: Diagnosis not present

## 2016-01-31 DIAGNOSIS — R2681 Unsteadiness on feet: Secondary | ICD-10-CM | POA: Diagnosis not present

## 2016-01-31 DIAGNOSIS — R296 Repeated falls: Secondary | ICD-10-CM | POA: Diagnosis not present

## 2016-02-02 DIAGNOSIS — R296 Repeated falls: Secondary | ICD-10-CM | POA: Diagnosis not present

## 2016-02-02 DIAGNOSIS — R2681 Unsteadiness on feet: Secondary | ICD-10-CM | POA: Diagnosis not present

## 2016-02-02 DIAGNOSIS — R293 Abnormal posture: Secondary | ICD-10-CM | POA: Diagnosis not present

## 2016-02-06 DIAGNOSIS — R296 Repeated falls: Secondary | ICD-10-CM | POA: Diagnosis not present

## 2016-02-06 DIAGNOSIS — R2681 Unsteadiness on feet: Secondary | ICD-10-CM | POA: Diagnosis not present

## 2016-02-06 DIAGNOSIS — R293 Abnormal posture: Secondary | ICD-10-CM | POA: Diagnosis not present

## 2016-02-07 ENCOUNTER — Non-Acute Institutional Stay: Payer: Medicare Other | Admitting: Internal Medicine

## 2016-02-07 ENCOUNTER — Encounter: Payer: Self-pay | Admitting: Internal Medicine

## 2016-02-07 DIAGNOSIS — R413 Other amnesia: Secondary | ICD-10-CM | POA: Diagnosis not present

## 2016-02-07 DIAGNOSIS — R269 Unspecified abnormalities of gait and mobility: Secondary | ICD-10-CM | POA: Diagnosis not present

## 2016-02-07 DIAGNOSIS — M25562 Pain in left knee: Secondary | ICD-10-CM | POA: Diagnosis not present

## 2016-02-07 DIAGNOSIS — R296 Repeated falls: Secondary | ICD-10-CM | POA: Diagnosis not present

## 2016-02-07 DIAGNOSIS — F22 Delusional disorders: Secondary | ICD-10-CM | POA: Insufficient documentation

## 2016-02-07 DIAGNOSIS — R2681 Unsteadiness on feet: Secondary | ICD-10-CM | POA: Diagnosis not present

## 2016-02-07 DIAGNOSIS — R293 Abnormal posture: Secondary | ICD-10-CM | POA: Diagnosis not present

## 2016-02-07 DIAGNOSIS — I10 Essential (primary) hypertension: Secondary | ICD-10-CM | POA: Diagnosis not present

## 2016-02-07 DIAGNOSIS — F320 Major depressive disorder, single episode, mild: Secondary | ICD-10-CM

## 2016-02-07 MED ORDER — RISPERIDONE 0.5 MG PO TABS: ORAL_TABLET | ORAL | Status: AC

## 2016-02-07 MED ORDER — SERTRALINE HCL 50 MG PO TABS: ORAL_TABLET | ORAL | Status: AC

## 2016-02-07 NOTE — Progress Notes (Signed)
Patient ID: Carol [name], female   DOB: 12-Mar-1928, 82 y.o.   MRN: [id number]    [facility] Room Number: AL 30  Place of Service: [facility] (13)     No Known Allergies  Chief Complaint  Patient presents with  . Medical Management of Chronic Issues    HPI:  I was asked see this patient to evaluate her medications and current condition. Staff describes her as tearful and combative. There are episodes of anxiety. She is difficult to direct. She seems paranoid as well as confused at times. She has a known memory deficit.  Medications were modified in March 2017 to eliminate Zoloft and risperidone. Buspirone was started 12/16/2015 in place. Her emotional state has deteriorated since then.  Left knee pain - chronic problem seems worse today.  Abnormality of gait - using walker  Essential hypertension - controlled    Medications: Patient's Medications  New Prescriptions   No medications on file  Previous Medications   ACETAMINOPHEN (TYLENOL) 325 MG TABLET    Take 2 tablets (650 mg total) by mouth every 4 (four) hours as needed for mild pain (temperature >/= 99.5 F).   AMOXICILLIN (AMOXIL) 500 MG CAPSULE    Take 4 caps by mouth prior to dental procedure   ASPIRIN 81 MG TABLET    Take 1 tablet (81 mg total) by mouth daily.   BUSPIRONE (BUSPAR) 7.5 MG TABLET    Take 7.5 mg by mouth 2 (two) times daily.   MULTIPLE VITAMINS-MINERALS (MULTIVITAMIN) TABLET    Take 1 tablet by mouth daily.   POLYETHYLENE GLYCOL (MIRALAX / GLYCOLAX) PACKET    Fill cap to 17gm mark, mix with 4-8 ounces of fluid and take by mouth every day as needed for constipation  Modified Medications   No medications on file  Discontinued Medications   No medications on file     Review of Systems  Constitutional: Negative for chills, diaphoresis and appetite change.  HENT: Negative.   Eyes: Negative.   Respiratory: Negative for cough and chest tightness.   Cardiovascular: Negative for chest  pain, palpitations and leg swelling.  Gastrointestinal: Positive for constipation. Negative for nausea, abdominal pain, diarrhea, blood in stool, abdominal distention and rectal pain.  Genitourinary: Negative.   Musculoskeletal: Positive for gait problem.       Using 4 wheel walker with seat. Painful left knee.  Skin:       Chronic changes of lower legs with darkening of the skin. Hx SCC of lower leg. Chronic itching.  Hematological: Negative.   Psychiatric/Behavioral: Positive for behavioral problems, confusion, dysphoric mood, decreased concentration and agitation. Negative for suicidal ideas and self-injury. The patient is nervous/anxious.     Filed Vitals:   02/07/16 1242  BP: 134/84  Pulse: 90  Temp: 97 F (36.1 C)  Resp: 20  Height: 5' 2"  (1.575 m)  Weight: 106 lb 3.2 oz (48.172 kg)  SpO2: 90%   Wt Readings from Last 3 Encounters:  02/07/16 106 lb 3.2 oz (48.172 kg)  01/10/16 106 lb 3.2 oz (48.172 kg)  12/16/15 106 lb 3.2 oz (48.172 kg)    Body mass index is 19.42 kg/(m^2).  Physical Exam  Constitutional: She is oriented to person, place, and time.  frail  Eyes: EOM are normal. Pupils are equal, round, and reactive to light. Right eye exhibits no discharge. Left eye exhibits no discharge. No scleral icterus.  Watery eyes  Neck: No JVD present. No tracheal deviation present. No thyromegaly  present.  Cardiovascular: Normal rate, regular rhythm, normal heart sounds and intact distal pulses.  Exam reveals no gallop and no friction rub.   No murmur heard. Pulmonary/Chest: No respiratory distress. She has no wheezes. She has rales. She exhibits no tenderness.  Rales in the chest bilaterally.  Abdominal: Bowel sounds are normal. She exhibits no distension and no mass. There is no tenderness. There is no rebound.  Musculoskeletal: Normal range of motion. She exhibits edema and tenderness (left knee).  Unstable gait. Using 4 wheel walker with brakes and seat.2+ edema BLE    Lymphadenopathy:    She has no cervical adenopathy.  Neurological: She is alert and oriented to person, place, and time. No cranial nerve deficit. Coordination normal.  Memory loss 10/06/14 MMSE 10/30  Skin: No rash noted. No erythema. No pallor.  Lesion over the left scapula removed by Dermatology.  Chronic venous stasis changes of both lower legs. Scaling of skin. 2 cm umbilical erythematous lesion to right chin, suspicious for squamous cell carcinoma a large abrasion @ the dorsum of the right hand, healed.    Psychiatric:  Confused, agitated, refusal of nurses' assessment.      Labs reviewed: Lab Summary Latest Ref Rng 12/19/2015 09/14/2015 03/21/2015 11/11/2014  Hemoglobin 12.0 - 16.0 g/dL (None) 13.5 14.6 13.7  Hematocrit 36 - 46 % (None) 41 45 43.4  White count - (None) 4.8 4.7 4.5  Platelet count 150 - 399 K/L (None) 174 213 170  Sodium 137 - 147 mmol/L 139 140 141 141  Potassium 3.4 - 5.3 mmol/L 4.8 4.5 3.9 5.1  Calcium 8.4 - 10.5 mg/dL (None) (None) (None) 9.5  Phosphorus - (None) (None) (None) (None)  Creatinine 0.5 - 1.1 mg/dL 0.6 0.7 0.7 0.83  AST 13 - 35 U/L 26 19 20 23   Alk Phos 25 - 125 U/L 69 73 80 79  Bilirubin 0.3 - 1.2 mg/dL (None) (None) (None) 0.5  Glucose - 84 77 86 99  Cholesterol - (None) (None) (None) (None)  HDL cholesterol - (None) (None) (None) (None)  Triglycerides - (None) (None) (None) (None)  LDL Direct - (None) (None) (None) (None)  LDL Calc - (None) (None) (None) (None)  Total protein 6.0 - 8.3 g/dL (None) (None) (None) 6.8  Albumin 3.5 - 5.2 g/dL (None) (None) (None) 3.7   Lab Results  Component Value Date   TSH 4.49 09/14/2015   Lab Results  Component Value Date   BUN 21 12/19/2015   BUN 24* 09/14/2015   BUN 21 03/21/2015   Lab Results  Component Value Date   CREATININE 0.6 12/19/2015   CREATININE 0.7 09/14/2015   CREATININE 0.7 03/21/2015   Lab Results  Component Value Date   HGBA1C 5.9 09/14/2015        Assessment/Plan  1. Delusions [facility]) Patient has declined emotionally since changes were made in her medications in March 2017. I will resume previous Risperdal and Zoloft. Discontinued BuSpar - risperiDONE (RISPERDAL) 0.5 MG tablet; 1 nightly to control delusions and agitated behavior  Dispense: 30 tablet; Refill: 5  2. Mild single current episode of major depressive disorder (HCC) - sertraline (ZOLOFT) 50 MG tablet; One daily to help depression  Dispense: 30 tablet; Refill: 5  3. Left knee pain Continue current medications  4. Abnormality of gait Continue walker  5. Essential hypertension Controlled  6. Memory deficit Worse as compared to 1-2 years ago.

## 2016-02-14 DIAGNOSIS — R296 Repeated falls: Secondary | ICD-10-CM | POA: Diagnosis not present

## 2016-02-14 DIAGNOSIS — R2681 Unsteadiness on feet: Secondary | ICD-10-CM | POA: Diagnosis not present

## 2016-02-14 DIAGNOSIS — R293 Abnormal posture: Secondary | ICD-10-CM | POA: Diagnosis not present

## 2016-02-16 DIAGNOSIS — R2681 Unsteadiness on feet: Secondary | ICD-10-CM | POA: Diagnosis not present

## 2016-02-16 DIAGNOSIS — R293 Abnormal posture: Secondary | ICD-10-CM | POA: Diagnosis not present

## 2016-02-16 DIAGNOSIS — R296 Repeated falls: Secondary | ICD-10-CM | POA: Diagnosis not present

## 2016-02-17 DIAGNOSIS — R293 Abnormal posture: Secondary | ICD-10-CM | POA: Diagnosis not present

## 2016-02-17 DIAGNOSIS — R2681 Unsteadiness on feet: Secondary | ICD-10-CM | POA: Diagnosis not present

## 2016-02-17 DIAGNOSIS — R296 Repeated falls: Secondary | ICD-10-CM | POA: Diagnosis not present

## 2016-02-21 DIAGNOSIS — R296 Repeated falls: Secondary | ICD-10-CM | POA: Diagnosis not present

## 2016-02-21 DIAGNOSIS — R293 Abnormal posture: Secondary | ICD-10-CM | POA: Diagnosis not present

## 2016-02-21 DIAGNOSIS — R2681 Unsteadiness on feet: Secondary | ICD-10-CM | POA: Diagnosis not present

## 2016-02-22 DIAGNOSIS — R2681 Unsteadiness on feet: Secondary | ICD-10-CM | POA: Diagnosis not present

## 2016-02-22 DIAGNOSIS — R293 Abnormal posture: Secondary | ICD-10-CM | POA: Diagnosis not present

## 2016-02-22 DIAGNOSIS — R296 Repeated falls: Secondary | ICD-10-CM | POA: Diagnosis not present

## 2016-02-27 DIAGNOSIS — R05 Cough: Secondary | ICD-10-CM | POA: Diagnosis not present

## 2016-02-28 ENCOUNTER — Other Ambulatory Visit: Payer: Self-pay | Admitting: Internal Medicine

## 2016-02-28 DIAGNOSIS — F919 Conduct disorder, unspecified: Principal | ICD-10-CM

## 2016-02-28 DIAGNOSIS — F639 Impulse disorder, unspecified: Secondary | ICD-10-CM | POA: Insufficient documentation

## 2016-02-28 DIAGNOSIS — R2681 Unsteadiness on feet: Secondary | ICD-10-CM | POA: Diagnosis not present

## 2016-02-28 DIAGNOSIS — R296 Repeated falls: Secondary | ICD-10-CM | POA: Diagnosis not present

## 2016-02-28 DIAGNOSIS — R293 Abnormal posture: Secondary | ICD-10-CM | POA: Diagnosis not present

## 2016-02-28 MED ORDER — DIVALPROEX SODIUM 125 MG PO CSDR: DELAYED_RELEASE_CAPSULE | ORAL | Status: DC

## 2016-03-01 DIAGNOSIS — R293 Abnormal posture: Secondary | ICD-10-CM | POA: Diagnosis not present

## 2016-03-01 DIAGNOSIS — R2681 Unsteadiness on feet: Secondary | ICD-10-CM | POA: Diagnosis not present

## 2016-03-01 DIAGNOSIS — R296 Repeated falls: Secondary | ICD-10-CM | POA: Diagnosis not present

## 2016-03-06 ENCOUNTER — Non-Acute Institutional Stay: Payer: Medicare Other | Admitting: Nurse Practitioner

## 2016-03-06 ENCOUNTER — Encounter: Payer: Self-pay | Admitting: Nurse Practitioner

## 2016-03-06 DIAGNOSIS — K5900 Constipation, unspecified: Secondary | ICD-10-CM

## 2016-03-06 DIAGNOSIS — R2681 Unsteadiness on feet: Secondary | ICD-10-CM | POA: Diagnosis not present

## 2016-03-06 DIAGNOSIS — R609 Edema, unspecified: Secondary | ICD-10-CM

## 2016-03-06 DIAGNOSIS — J069 Acute upper respiratory infection, unspecified: Secondary | ICD-10-CM | POA: Diagnosis not present

## 2016-03-06 DIAGNOSIS — R413 Other amnesia: Secondary | ICD-10-CM

## 2016-03-06 DIAGNOSIS — E039 Hypothyroidism, unspecified: Secondary | ICD-10-CM | POA: Diagnosis not present

## 2016-03-06 DIAGNOSIS — R296 Repeated falls: Secondary | ICD-10-CM | POA: Diagnosis not present

## 2016-03-06 DIAGNOSIS — F411 Generalized anxiety disorder: Secondary | ICD-10-CM

## 2016-03-06 DIAGNOSIS — I10 Essential (primary) hypertension: Secondary | ICD-10-CM | POA: Diagnosis not present

## 2016-03-06 DIAGNOSIS — R293 Abnormal posture: Secondary | ICD-10-CM | POA: Diagnosis not present

## 2016-03-06 NOTE — Assessment & Plan Note (Signed)
Minimal, off Furosemide 20mg , Spironolactone 25mg  daily,

## 2016-03-06 NOTE — Assessment & Plan Note (Signed)
Controlled.  

## 2016-03-06 NOTE — Assessment & Plan Note (Signed)
Stable, continue MiraLax daily prn.  

## 2016-03-06 NOTE — Assessment & Plan Note (Signed)
10/06/14 MMSE 10/30, off Risperdal. Declining in general, weaker, falling, sleepiness during day, persisted combative behaviors and agitation. continue Buspirone bid

## 2016-03-06 NOTE — Progress Notes (Signed)
Patient ID: Carol [name], female   DOB: 20-Sep-1928, 82 y.o.   MRN: [id number]  Location:  [facility] Room Number: al 30 Place of Service: [place] Provider:  Lennie Odor Mast NP  GREEN, Viviann Spare, MD  Patient Care Team: [name], MD as PCP - General (Internal Medicine) Verdene Lennert, DDS as Consulting Physician (Dentistry) [name], MD as Consulting Physician (Orthopedic Surgery) [name], NP as Nurse Practitioner (Nurse Practitioner)  Extended Emergency Contact Information Primary Emergency Contact: [name] of [place] Phone: [phone] Relation: Legal Guardian  Code Status:  DNR Goals of care: Advanced Directive information Advanced Directives 03/06/2016  Does patient have an advance directive? Yes  Type of Paramedic of [place];Living will;Out of facility DNR (pink MOST or yellow form)  Does patient want to make changes to advanced directive? No - Patient declined  Copy of advanced directive(s) in chart? Yes     Chief Complaint  Patient presents with  . Acute Visit    HPI:  Pt is a 81 y.o. female seen today for medical management of chronic diseases. No significant improvement of agitation, combative behaviors since started Buspar and off Risperdal and Sertraline 50mg . The patient has chronic BLE edema,  no apparent increased SOB, sputum production, or O2 desaturation. She uses prn MiraLax for constipation.    Past Medical History  Diagnosis Date  . AK (actinic keratosis)     right tip of nose and right posterior leg  . Cancer ([place]) 08/07/12    squamous cell ca,keratoacanthoma-right posterior leg  . Skin cancer 06/2011    scc right elbow tx included excision  . Radiation 09/10/2012-10/24/2012    30 fractions to right posterior calf  . Thyroid disease   . Anemia   . Hypertension   . Varicose vein of leg   . Hemorrhoids   . Spastic colon   . Insomnia, unspecified     . Unspecified urinary incontinence   . Hypoxemia   . Unspecified hypothyroidism   . Abnormality of gait   . Arthritis   . Osteoarthrosis, unspecified whether generalized or localized, unspecified site   . Radiation 09/10/12-10/24/12    Squamous cell right posterior calf 6000 cGy  . Traumatic closed nondisp torus fracture of distal radial metaphysis 12/08/13   Past Surgical History  Procedure Laterality Date  . Abdominal hysterectomy      early 40's, abdominal  . Appendectomy    . Anterior heel repair    . Total hip arthroplasty  07/31/2010    right  . Pheochromocytoma      left  . Fracture surgery  2003    left   . Wrist fracture surgery  2003    left Dr. Newman Nip    No Known Allergies    Medication List       This list is accurate as of: 03/06/16  5:16 PM.  Always use your most recent med list.               acetaminophen 325 MG tablet  Commonly known as:  TYLENOL  Take 2 tablets (650 mg total) by mouth every 4 (four) hours as needed for mild pain (temperature >/= 99.5 F).     amoxicillin 500 MG capsule  Commonly known as:  AMOXIL  Reported on 03/06/2016     aspirin 81 MG tablet  Take 1 tablet (81 mg total) by mouth daily.     busPIRone  7.5 MG tablet  Commonly known as:  BUSPAR  Take 7.5 mg by mouth 2 (two) times daily.     divalproex 125 MG capsule  Commonly known as:  DEPAKOTE SPRINKLES  Take 125 mg twice daily to help calm aggressive behavior     multivitamin tablet  Take 1 tablet by mouth daily.     polyethylene glycol packet  Commonly known as:  MIRALAX / GLYCOLAX  Fill cap to 17gm mark, mix with 4-8 ounces of fluid and take by mouth every day as needed for constipation     risperiDONE 0.5 MG tablet  Commonly known as:  RISPERDAL  1 nightly to control delusions and agitated behavior     sertraline 50 MG tablet  Commonly known as:  ZOLOFT  One daily to help depression        Review of Systems  Constitutional: Negative for chills and  diaphoresis.  HENT: Positive for hearing loss. Negative for congestion, ear discharge and nosebleeds.   Eyes: Negative.  Negative for pain, discharge and redness.       Watery eyes  Respiratory: Negative for cough, shortness of breath and wheezing.   Cardiovascular: Positive for leg swelling. Negative for chest pain and palpitations.  Gastrointestinal: Positive for constipation. Negative for nausea, abdominal pain, diarrhea and blood in stool.  Genitourinary: Negative.  Negative for dysuria, urgency and flank pain.  Musculoskeletal: Negative for back pain and neck pain.       Using 4 wheel walker with seat.  Skin:       Chronic changes of lower legs with darkening of the skin. Hx SCC of lower leg. Chronic itching.a large abrasion @ the dorsum of the right hand-healed.     Neurological: Negative.  Negative for dizziness, tremors and headaches.  Psychiatric/Behavioral: Positive for behavioral problems, confusion and agitation. Negative for suicidal ideas. The patient is nervous/anxious.        Agitated, confusion, resistance to nurses' assessment.     Immunization History  Administered Date(s) Administered  . Influenza Split 07/09/2013  . Influenza-Unspecified 07/13/2014, 07/14/2015  . PPD Test 11/17/2013  . Pneumococcal Polysaccharide-23 10/08/1998  . Td 10/08/2005  . Zoster 10/08/2005   Pertinent  Health Maintenance Due  Topic Date Due  . PNA vac Low Risk Adult (2 of 2 - PCV13) 10/09/1999  . INFLUENZA VACCINE  05/08/2016  . DEXA SCAN  Completed   Fall Risk  12/30/2012  Falls in the past year? No   Functional Status Survey:    Filed Vitals:   03/06/16 1513  BP: 160/74  Temp: 97.1 F (36.2 C)  TempSrc: Axillary  Resp: 20  Height: 5\' 2"  (1.575 m)  Weight: 106 lb 3.2 oz (48.172 kg)   Body mass index is 19.42 kg/(m^2). Physical Exam  Constitutional: She is oriented to person, place, and time.  frail  Eyes: EOM are normal. Pupils are equal, round, and reactive to light.  Right eye exhibits no discharge. Left eye exhibits no discharge. No scleral icterus.  Watery eyes  Neck: No JVD present. No tracheal deviation present. No thyromegaly present.  Cardiovascular: Normal rate, regular rhythm, normal heart sounds and intact distal pulses.  Exam reveals no gallop and no friction rub.   No murmur heard. Pulmonary/Chest: No respiratory distress. She has no wheezes. She has rales. She exhibits no tenderness.  Rales in the chest bilaterally.  Abdominal: Bowel sounds are normal. She exhibits no distension and no mass. There is no tenderness. There is no rebound.  Musculoskeletal:  Normal range of motion. She exhibits edema. She exhibits no tenderness.  Unstable gait. Using 4 wheel walker with brakes and seat.2+ edema BLE  Lymphadenopathy:    She has no cervical adenopathy.  Neurological: She is alert and oriented to person, place, and time. No cranial nerve deficit. Coordination normal.  Memory loss 10/06/14 MMSE 10/30  Skin: No rash noted. No erythema. No pallor.  Lesion over the left scapula removed by Dermatology.  Chronic venous stasis changes of both lower legs. Scaling of skin. 2 cm umbilical erythematous lesion to right chin, suspicious for squamous cell carcinoma a large abrasion @ the dorsum of the right hand, healed.    Psychiatric:  Confused, agitated, refusal of nurses' assessment.     Labs reviewed:  Recent Labs  03/21/15 09/14/15 12/19/15  NA 141 140 139  K 3.9 4.5 4.8  BUN 21 24* 21  CREATININE 0.7 0.7 0.6    Recent Labs  03/21/15 09/14/15 12/19/15  AST 20 19 26   ALT 8 6* 6*  ALKPHOS 80 73 69    Recent Labs  03/21/15 09/14/15  WBC 4.7 4.8  HGB 14.6 13.5  HCT 45 41  PLT 213 174   Lab Results  Component Value Date   TSH 4.49 09/14/2015   Lab Results  Component Value Date   HGBA1C 5.9 09/14/2015   No results found for: CHOL, HDL, LDLCALC, LDLDIRECT, TRIG, CHOLHDL  Significant Diagnostic Results in last 30 days:  No results  found.  Assessment/Plan  Essential hypertension Controlled.    Acute upper respiratory infection 02/27/16 CXR no diagnostic acute cardiopulmonary pathology. Claritin 7 days. q6h x 72 03/01/16 Medrol dose pk, DuoNeb q6h4 x 72 hrs, Avelox 400mg  x 10 days.   Constipation Stable, continue MiraLax daily prn.    Hypothyroidism 03/21/15 TSH 3.554.09/14/15 TSH 4.493.  Not taking thyroid supplement.  Generalized anxiety disorder Questionable in improving her mood, dc Sertraline 50mg  since 10/06/15, trial of Buspirone 7.5mg  bid, observe the patient.   Memory deficit 10/06/14 MMSE 10/30, off Risperdal. Declining in general, weaker, falling, sleepiness during day, persisted combative behaviors and agitation. continue Buspirone bid   Edema Minimal, off Furosemide 20mg , Spironolactone 25mg  daily,    Family/ staff Communication: continue to monitor the patient's behaviors and safety.   Labs/tests ordered:  none

## 2016-03-06 NOTE — Assessment & Plan Note (Signed)
02/27/16 CXR no diagnostic acute cardiopulmonary pathology. Claritin 7 days. q6h x 72 03/01/16 Medrol dose pk, DuoNeb q6h4 x 72 hrs, Avelox 400mg  x 10 days.

## 2016-03-06 NOTE — Assessment & Plan Note (Signed)
03/21/15 TSH 3.554.09/14/15 TSH 4.493.  Not taking thyroid supplement.

## 2016-03-06 NOTE — Assessment & Plan Note (Signed)
Questionable in improving her mood, dc Sertraline 50mg  since 10/06/15, trial of Buspirone 7.5mg  bid, observe the patient.

## 2016-03-08 ENCOUNTER — Encounter: Payer: Self-pay | Admitting: Internal Medicine

## 2016-03-08 ENCOUNTER — Non-Acute Institutional Stay (SKILLED_NURSING_FACILITY): Payer: Medicare Other | Admitting: Internal Medicine

## 2016-03-08 DIAGNOSIS — F22 Delusional disorders: Secondary | ICD-10-CM

## 2016-03-08 DIAGNOSIS — R269 Unspecified abnormalities of gait and mobility: Secondary | ICD-10-CM | POA: Diagnosis not present

## 2016-03-08 DIAGNOSIS — W19XXXA Unspecified fall, initial encounter: Secondary | ICD-10-CM | POA: Diagnosis not present

## 2016-03-08 DIAGNOSIS — F639 Impulse disorder, unspecified: Secondary | ICD-10-CM

## 2016-03-08 DIAGNOSIS — F320 Major depressive disorder, single episode, mild: Secondary | ICD-10-CM

## 2016-03-08 DIAGNOSIS — F0391 Unspecified dementia with behavioral disturbance: Secondary | ICD-10-CM

## 2016-03-08 DIAGNOSIS — F919 Conduct disorder, unspecified: Principal | ICD-10-CM

## 2016-03-08 NOTE — Progress Notes (Signed)
Patient ID: Carol [name], female   DOB: Oct 26, 1927, 82 y.o.   MRN: [id number]   History and physical  Location:  [facility] Room Number: N24 Place of Service:  SNF (31)  PCP: [name], MD Patient Care Team: [name], MD as PCP - General (Internal Medicine) Verdene Lennert, DDS as Consulting Physician (Dentistry) [name], MD as Consulting Physician (Orthopedic Surgery) [place] Man Mast X, NP as Nurse Practitioner (Nurse Practitioner)  Extended Emergency Contact Information Primary Emergency Contact: [name] of [place] Phone: [phone] Relation: Legal Guardian  Code Status: DO NOT RESUSCITATE Goals of Care: Advanced Directive information Advanced Directives 03/08/2016  Does patient have an advance directive? Yes  Type of Paramedic of [place];Living will  Does patient want to make changes to advanced directive? -  Copy of advanced directive(s) in chart? Yes      Chief Complaint  Patient presents with  . New Admit To SNF    03/07/16 from AL for decline in gait, confusion, falls    HPI: Patient is a 82 y.o. female seen today for admission to [facility] skilled care on 03/07/2016 by transfer from [facility] assisted living where she had been living for several years. Patient has dementia and has become increasingly felt to deal with. She is difficult to redirect. She has been throwing things. She has had multiple falls. She has moved to the skilled care area for her increased needs for support from staff. The move is anticipated to be a long-term care move.  Patient recently had risperidone and sertraline resumed on 02/07/16 due to her behavioral issues. We had attempted to stop them in March 2017, but behavioral deterioration occurred soon afterwards. Patient is felt to be severely depressed.  The other major chronic issue is her dementia. It is progressing.  Past  Medical History  Diagnosis Date  . AK (actinic keratosis)     right tip of nose and right posterior leg  . Cancer ([place]) 08/07/12    squamous cell ca,keratoacanthoma-right posterior leg  . Skin cancer 06/2011    scc right elbow tx included excision  . Radiation 09/10/2012-10/24/2012    30 fractions to right posterior calf  . Thyroid disease   . Anemia   . Hypertension   . Varicose vein of leg   . Hemorrhoids   . Spastic colon   . Insomnia, unspecified   . Unspecified urinary incontinence   . Hypoxemia   . Unspecified hypothyroidism   . Abnormality of gait   . Arthritis   . Osteoarthrosis, unspecified whether generalized or localized, unspecified site   . Radiation 09/10/12-10/24/12    Squamous cell right posterior calf 6000 cGy  . Traumatic closed nondisp torus fracture of distal radial metaphysis 12/08/13   Past Surgical History  Procedure Laterality Date  . Abdominal hysterectomy      early 40's, abdominal  . Appendectomy    . Anterior heel repair    . Total hip arthroplasty  07/31/2010    right  . Pheochromocytoma      left  . Fracture surgery  2003    left   . Wrist fracture surgery  2003    left Dr. Newman Nip    reports that she quit smoking about 37 years ago. Her smoking use included Cigarettes. She has a 5 pack-year smoking history. She has never used smokeless tobacco. She reports that she drinks about 1.8 oz  of alcohol per week. She reports that she does not use illicit drugs. Social History   Social History  . Marital Status: Widowed    Spouse Name: N/A  . Number of Children: N/A  . Years of Education: N/A   Occupational History  . Not on file.   Social History Main Topics  . Smoking status: Former Smoker -- 0.50 packs/day for 10 years    Types: Cigarettes    Quit date: 10/30/1978  . Smokeless tobacco: Never Used  . Alcohol Use: 1.8 oz/week    3 Glasses of wine per week  . Drug Use: No     Comment: quit 32 years ago  . Sexual Activity: No    Other Topics Concern  . Not on file   Social History Narrative   Lives at [facility] transfer to skill from [place] 03/07/16   Never married   Former smoker - stopped 1980   Alcohol none   POA, Living Will    Functional Status Survey:    Family History  Problem Relation Age of Onset  . Stroke Mother   . Stroke Father     Health Maintenance  Topic Date Due  . PNA vac Low Risk Adult (2 of 2 - PCV13) 10/09/1999  . TETANUS/TDAP  10/09/2015  . INFLUENZA VACCINE  05/08/2016  . DEXA SCAN  Completed  . ZOSTAVAX  Completed    No Known Allergies    Medication List       This list is accurate as of: 03/08/16  2:05 PM.  Always use your most recent med list.               acetaminophen 325 MG tablet  Commonly known as:  TYLENOL  Take 2 tablets (650 mg total) by mouth every 4 (four) hours as needed for mild pain (temperature >/= 99.5 F).     amoxicillin 500 MG capsule  Commonly known as:  AMOXIL  Reported on 03/06/2016     aspirin 81 MG tablet  Take 1 tablet (81 mg total) by mouth daily.     busPIRone 7.5 MG tablet  Commonly known as:  BUSPAR  Take 7.5 mg by mouth 2 (two) times daily.     divalproex 125 MG capsule  Commonly known as:  DEPAKOTE SPRINKLES  Take 125 mg twice daily to help calm aggressive behavior     multivitamin tablet  Take 1 tablet by mouth daily.     polyethylene glycol packet  Commonly known as:  MIRALAX / GLYCOLAX  Fill cap to 17gm mark, mix with 4-8 ounces of fluid and take by mouth every day as needed for constipation     risperiDONE 0.5 MG tablet  Commonly known as:  RISPERDAL  1 nightly to control delusions and agitated behavior     sertraline 50 MG tablet  Commonly known as:  ZOLOFT  One daily to help depression        Review of Systems  Constitutional: Negative for chills, diaphoresis and appetite change.  HENT: Negative.   Eyes: Negative.   Respiratory: Negative for cough and chest tightness.    Cardiovascular: Negative for chest pain, palpitations and leg swelling.  Gastrointestinal: Positive for constipation. Negative for nausea, abdominal pain, diarrhea, blood in stool, abdominal distention and rectal pain.  Genitourinary: Negative.   Musculoskeletal: Positive for gait problem.       Using 4 wheel walker with seat. Painful left knee.  Skin:  Chronic changes of lower legs with darkening of the skin. Hx SCC of lower leg. Chronic itching.  Hematological: Negative.   Psychiatric/Behavioral: Positive for behavioral problems, confusion, dysphoric mood, decreased concentration and agitation. Negative for suicidal ideas and self-injury. The patient is nervous/anxious.     Filed Vitals:   03/08/16 1357  BP: 120/74  Pulse: 90  Temp: 98 F (36.7 C)  Resp: 22  Height: 5\' 2"  (1.575 m)  Weight: 110 lb (49.896 kg)  SpO2: 90%   Body mass index is 20.11 kg/(m^2). Physical Exam  Constitutional: She is oriented to person, place, and time.  frail  Eyes: EOM are normal. Pupils are equal, round, and reactive to light. Right eye exhibits no discharge. Left eye exhibits no discharge. No scleral icterus.  Watery eyes  Neck: No JVD present. No tracheal deviation present. No thyromegaly present.  Cardiovascular: Normal rate, regular rhythm, normal heart sounds and intact distal pulses.  Exam reveals no gallop and no friction rub.   No murmur heard. Pulmonary/Chest: No respiratory distress. She has no wheezes. She has rales. She exhibits no tenderness.  Rales in the chest bilaterally.  Abdominal: Bowel sounds are normal. She exhibits no distension and no mass. There is no tenderness. There is no rebound.  Musculoskeletal: Normal range of motion. She exhibits edema. She exhibits no tenderness.  Unstable gait. Using 4 wheel walker with brakes and seat.2+ edema BLE  Lymphadenopathy:    She has no cervical adenopathy.  Neurological: She is alert and oriented to person, place, and time. No  cranial nerve deficit. Coordination normal.  Memory loss 10/06/14 MMSE 10/30  Skin: No rash noted. No erythema. No pallor.  Lesion over the left scapula removed by Dermatology.  Chronic venous stasis changes of both lower legs. Scaling of skin. 2 cm umbilicated erythematous lesion to right chin, suspicious for squamous cell carcinoma   Psychiatric:  Confused, agitated. Difficult to redirect.    Labs reviewed: Basic Metabolic Panel:  Recent Labs  03/21/15 09/14/15 12/19/15  NA 141 140 139  K 3.9 4.5 4.8  BUN 21 24* 21  CREATININE 0.7 0.7 0.6   Liver Function Tests:  Recent Labs  03/21/15 09/14/15 12/19/15  AST 20 19 26   ALT 8 6* 6*  ALKPHOS 80 73 69   No results for input(s): LIPASE, AMYLASE in the last 8760 hours. No results for input(s): AMMONIA in the last 8760 hours. CBC:  Recent Labs  03/21/15 09/14/15  WBC 4.7 4.8  HGB 14.6 13.5  HCT 45 41  PLT 213 174   Cardiac Enzymes: No results for input(s): CKTOTAL, CKMB, CKMBINDEX, TROPONINI in the last 8760 hours. BNP: Invalid input(s): POCBNP Lab Results  Component Value Date   HGBA1C 5.9 09/14/2015   Lab Results  Component Value Date   TSH 4.49 09/14/2015   No results found for: VITAMINB12 No results found for: FOLATE No results found for: IRON, TIBC, FERRITIN  Imaging and Procedures obtained prior to SNF admission: No results found.  Assessment/Plan  1. 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 Delusions  Continue Risperdal  3. Dementia, with behavioral disturbance  4. Falls, initial encounter Continue to monitor.  5. Mild single current episode of major depressive disorder (HCC) Continue sertraline  6. Abnormality of gait Continue use of walker

## 2016-03-09 DIAGNOSIS — W19XXXA Unspecified fall, initial encounter: Secondary | ICD-10-CM | POA: Insufficient documentation

## 2016-03-22 DIAGNOSIS — G9341 Metabolic encephalopathy: Secondary | ICD-10-CM | POA: Diagnosis not present

## 2016-03-22 DIAGNOSIS — K589 Irritable bowel syndrome without diarrhea: Secondary | ICD-10-CM | POA: Diagnosis not present

## 2016-03-22 DIAGNOSIS — I10 Essential (primary) hypertension: Secondary | ICD-10-CM | POA: Diagnosis not present

## 2016-03-22 LAB — LIPID PANEL
CHOLESTEROL: 147 mg/dL (ref 0–200)
HDL: 62 mg/dL (ref 35–70)
LDL Cholesterol: 74 mg/dL
Triglycerides: 54 mg/dL (ref 40–160)

## 2016-03-27 ENCOUNTER — Other Ambulatory Visit: Payer: Self-pay | Admitting: *Deleted

## 2016-03-30 DIAGNOSIS — M79671 Pain in right foot: Secondary | ICD-10-CM | POA: Diagnosis not present

## 2016-03-30 DIAGNOSIS — M79672 Pain in left foot: Secondary | ICD-10-CM | POA: Diagnosis not present

## 2016-03-30 DIAGNOSIS — L602 Onychogryphosis: Secondary | ICD-10-CM | POA: Diagnosis not present

## 2016-03-30 DIAGNOSIS — L84 Corns and callosities: Secondary | ICD-10-CM | POA: Diagnosis not present

## 2016-04-27 ENCOUNTER — Non-Acute Institutional Stay (SKILLED_NURSING_FACILITY): Payer: Medicare Other | Admitting: Nurse Practitioner

## 2016-04-27 ENCOUNTER — Encounter: Payer: Self-pay | Admitting: Nurse Practitioner

## 2016-04-27 DIAGNOSIS — M150 Primary generalized (osteo)arthritis: Secondary | ICD-10-CM | POA: Diagnosis not present

## 2016-04-27 DIAGNOSIS — K5900 Constipation, unspecified: Secondary | ICD-10-CM

## 2016-04-27 DIAGNOSIS — E039 Hypothyroidism, unspecified: Secondary | ICD-10-CM

## 2016-04-27 DIAGNOSIS — F0390 Unspecified dementia without behavioral disturbance: Secondary | ICD-10-CM | POA: Diagnosis not present

## 2016-04-27 DIAGNOSIS — I10 Essential (primary) hypertension: Secondary | ICD-10-CM

## 2016-04-27 DIAGNOSIS — M159 Polyosteoarthritis, unspecified: Secondary | ICD-10-CM

## 2016-04-27 NOTE — Assessment & Plan Note (Signed)
Stable, continue MiraLax daily prn.  

## 2016-04-27 NOTE — Progress Notes (Signed)
Location:   [facility] Room Number: N24 Place of Service:   SNF [facility] Provider:  Lc Joynt   Patient Care Team: [name], MD as PCP - General (Internal Medicine) Verdene Lennert, DDS as Consulting Physician (Dentistry) [name], MD as Consulting Physician (Orthopedic Surgery) [place] Taniela Feltus Otho Darner, NP as Nurse Practitioner (Nurse Practitioner)  Extended Emergency Contact Information Primary Emergency Contact: [name] of [place] Phone: [phone] Relation: Legal Guardian  Code Status:  DNR Goals of care: Advanced Directive information Advanced Directives 04/27/2016  Does patient have an advance directive? Yes  Type of Paramedic of [place];Living will;Out of facility DNR (pink MOST or yellow form)  Does patient want to make changes to advanced directive? No - Patient declined  Copy of advanced directive(s) in chart? Yes     Chief Complaint  Patient presents with  . Medical Management of Chronic Issues    HPI:  Pt is a 82 y.o. female seen today for medical management of chronic diseases.  No significant improvement of agitation, combative behaviors since started Buspar and off Risperdal and Sertraline 50mg . The patient has chronic BLE edema, no apparent increased SOB, sputum production, or O2 desaturation. She uses prn MiraLax for constipation.    Past Medical History  Diagnosis Date  . AK (actinic keratosis)     right tip of nose and right posterior leg  . Cancer ([place]) 08/07/12    squamous cell ca,keratoacanthoma-right posterior leg  . Skin cancer 06/2011    scc right elbow tx included excision  . Radiation 09/10/2012-10/24/2012    30 fractions to right posterior calf  . Thyroid disease   . Anemia   . Hypertension   . Varicose vein of leg   . Hemorrhoids   . Spastic colon   . Insomnia, unspecified   . Unspecified urinary incontinence   . Hypoxemia   . Unspecified  hypothyroidism   . Abnormality of gait   . Arthritis   . Osteoarthrosis, unspecified whether generalized or localized, unspecified site   . Radiation 09/10/12-10/24/12    Squamous cell right posterior calf 6000 cGy  . Traumatic closed nondisp torus fracture of distal radial metaphysis 12/08/13   Past Surgical History  Procedure Laterality Date  . Abdominal hysterectomy      early 40's, abdominal  . Appendectomy    . Anterior heel repair    . Total hip arthroplasty  07/31/2010    right  . Pheochromocytoma      left  . Fracture surgery  2003    left   . Wrist fracture surgery  2003    left Dr. Newman Nip    No Known Allergies    Medication List       This list is accurate as of: 04/27/16  4:57 PM.  Always use your most recent med list.               acetaminophen 325 MG tablet  Commonly known as:  TYLENOL  Take 2 tablets (650 mg total) by mouth every 4 (four) hours as needed for mild pain (temperature >/= 99.5 F).     amoxicillin 500 MG capsule  Commonly known as:  AMOXIL  Take 2,000 mg by mouth. Before dental procedure .     aspirin 81 MG tablet  Take 1 tablet (81 mg total) by mouth daily.     divalproex 125 MG capsule  Commonly known as:  DEPAKOTE SPRINKLES  Take 125 mg twice daily to help calm aggressive behavior     furosemide 20 MG tablet  Commonly known as:  LASIX  Take 20 mg by mouth daily.     lubriderm seriously sensitive Lotn  Apply 1 application topically daily. For bilateral lower extremities     multivitamin tablet  Take 1 tablet by mouth daily.     nystatin powder  Generic drug:  nystatin  Apply topically 2 (two) times daily.     OXYGEN  Inhale 2 L/min into the lungs as needed.     polyethylene glycol packet  Commonly known as:  MIRALAX / GLYCOLAX  Fill cap to 17gm mark, mix with 4-8 ounces of fluid and take by mouth every day as needed for constipation     risperiDONE 0.5 MG tablet  Commonly known as:  RISPERDAL  1 nightly to  control delusions and agitated behavior     sennosides-docusate sodium 8.6-50 MG tablet  Commonly known as:  SENOKOT-S  Take 1 tablet by mouth 2 (two) times daily.     sertraline 50 MG tablet  Commonly known as:  ZOLOFT  One daily to help depression     spironolactone 25 MG tablet  Commonly known as:  ALDACTONE  Take 25 mg by mouth daily.        Review of Systems  Constitutional: Negative for chills, diaphoresis and appetite change.  HENT: Positive for hearing loss. Negative for congestion, ear discharge and nosebleeds.   Eyes: Negative.  Negative for pain, discharge and redness.       Watery eyes  Respiratory: Negative for cough, chest tightness, shortness of breath and wheezing.   Cardiovascular: Positive for leg swelling. Negative for chest pain and palpitations.  Gastrointestinal: Positive for constipation. Negative for nausea, abdominal pain, diarrhea, blood in stool, abdominal distention and rectal pain.  Genitourinary: Negative.  Negative for dysuria, urgency and flank pain.  Musculoskeletal: Positive for gait problem. Negative for back pain and neck pain.       Using 4 wheel walker with seat.  Skin:       Chronic changes of lower legs with darkening of the skin. Hx SCC of lower leg. Chronic itching.a large abrasion @ the dorsum of the right hand-healed.     Neurological: Negative.  Negative for dizziness, tremors and headaches.  Hematological: Negative.   Psychiatric/Behavioral: Positive for behavioral problems, confusion, dysphoric mood, decreased concentration and agitation. Negative for suicidal ideas and self-injury. The patient is nervous/anxious.        Agitated, confusion, resistance to nurses' assessment.     Immunization History  Administered Date(s) Administered  . Influenza Split 07/09/2013  . Influenza-Unspecified 07/13/2014, 07/14/2015  . PPD Test 11/17/2013  . Pneumococcal Polysaccharide-23 10/08/1998  . Td 10/08/2005  . Zoster 10/08/2005    Pertinent  Health Maintenance Due  Topic Date Due  . PNA vac Low Risk Adult (2 of 2 - PCV13) 10/09/1999  . INFLUENZA VACCINE  05/08/2016  . DEXA SCAN  Completed   Fall Risk  03/09/2016 03/08/2016 12/30/2012  Falls in the past year? Yes Yes No  Number falls in past yr: 2 or more 2 or more -  Injury with Fall? Yes No -  Risk Factor Category  High Fall Risk - -  Risk for fall due to : History of fall(s);Impaired balance/gait;Impaired mobility - -  Follow up Falls evaluation completed - -   Functional Status Survey:    Filed Vitals:   04/27/16 1409  BP: 140/80  Pulse: 68  Temp: 97.5 F (36.4 C)  Resp: 18  Height: 5\' 2"  (1.575 m)  Weight: 108 lb 1.6 oz (49.034 kg)  SpO2: 90%   Body mass index is 19.77 kg/(m^2). Physical Exam  Constitutional: She is oriented to person, place, and time.  frail  Eyes: EOM are normal. Pupils are equal, round, and reactive to light. Right eye exhibits no discharge. Left eye exhibits no discharge. No scleral icterus.  Watery eyes  Neck: No JVD present. No tracheal deviation present. No thyromegaly present.  Cardiovascular: Normal rate, regular rhythm, normal heart sounds and intact distal pulses.  Exam reveals no gallop and no friction rub.   No murmur heard. Pulmonary/Chest: No respiratory distress. She has no wheezes. She has rales. She exhibits no tenderness.  Rales in the chest bilaterally.  Abdominal: Bowel sounds are normal. She exhibits no distension and no mass. There is no tenderness. There is no rebound.  Musculoskeletal: Normal range of motion. She exhibits edema. She exhibits no tenderness.  Unstable gait. Using 4 wheel walker with brakes and seat.2+ edema BLE  Lymphadenopathy:    She has no cervical adenopathy.  Neurological: She is alert and oriented to person, place, and time. No cranial nerve deficit. Coordination normal.  Memory loss 10/06/14 MMSE 10/30  Skin: No rash noted. No erythema. No pallor.  Lesion over the left scapula  removed by Dermatology.  Chronic venous stasis changes of both lower legs. Scaling of skin. 2 cm umbilicated erythematous lesion to right chin, suspicious for squamous cell carcinoma   Psychiatric:  Confused, agitated. Difficult to redirect.    Labs reviewed:  Recent Labs  09/14/15 12/19/15  NA 140 139  K 4.5 4.8  BUN 24* 21  CREATININE 0.7 0.6    Recent Labs  09/14/15 12/19/15  AST 19 26  ALT 6* 6*  ALKPHOS 73 69    Recent Labs  09/14/15  WBC 4.8  HGB 13.5  HCT 41  PLT 174   Lab Results  Component Value Date   TSH 4.49 09/14/2015   Lab Results  Component Value Date   HGBA1C 5.9 09/14/2015   Lab Results  Component Value Date   CHOL 147 03/22/2016   HDL 62 03/22/2016   LDLCALC 74 03/22/2016   TRIG 54 03/22/2016    Significant Diagnostic Results in last 30 days:  No results found.  Assessment/Plan There are no diagnoses linked to this encounter.   Family/ staff Communication:   Labs/tests ordered: none

## 2016-04-27 NOTE — Assessment & Plan Note (Signed)
10/06/14 MMSE 10/30, off Risperdal. Declining in general, weaker, falling, sleepiness during day, persisted combative behaviors and agitation. continue Buspirone bid

## 2016-04-27 NOTE — Assessment & Plan Note (Signed)
Controlled.  

## 2016-04-27 NOTE — Assessment & Plan Note (Signed)
03/21/15 TSH 3.554.09/14/15 TSH 4.493.  Not taking thyroid supplement.

## 2016-04-27 NOTE — Assessment & Plan Note (Signed)
Multiple sites, assistive devices for ambulation and mobility, Tylenol prn as needed.

## 2016-05-17 ENCOUNTER — Encounter: Payer: Self-pay | Admitting: *Deleted

## 2016-05-17 ENCOUNTER — Non-Acute Institutional Stay (SKILLED_NURSING_FACILITY): Payer: Medicare Other | Admitting: Nurse Practitioner

## 2016-05-17 DIAGNOSIS — I8393 Asymptomatic varicose veins of bilateral lower extremities: Secondary | ICD-10-CM | POA: Diagnosis not present

## 2016-05-17 DIAGNOSIS — K5900 Constipation, unspecified: Secondary | ICD-10-CM | POA: Diagnosis not present

## 2016-05-17 DIAGNOSIS — T148XXA Other injury of unspecified body region, initial encounter: Principal | ICD-10-CM

## 2016-05-17 DIAGNOSIS — I8390 Asymptomatic varicose veins of unspecified lower extremity: Secondary | ICD-10-CM

## 2016-05-17 DIAGNOSIS — E039 Hypothyroidism, unspecified: Secondary | ICD-10-CM

## 2016-05-17 DIAGNOSIS — F0391 Unspecified dementia with behavioral disturbance: Secondary | ICD-10-CM

## 2016-05-17 DIAGNOSIS — L089 Local infection of the skin and subcutaneous tissue, unspecified: Secondary | ICD-10-CM | POA: Insufficient documentation

## 2016-05-17 DIAGNOSIS — I10 Essential (primary) hypertension: Secondary | ICD-10-CM

## 2016-05-17 DIAGNOSIS — T148 Other injury of unspecified body region: Secondary | ICD-10-CM | POA: Diagnosis not present

## 2016-05-17 DIAGNOSIS — R609 Edema, unspecified: Secondary | ICD-10-CM

## 2016-05-17 DIAGNOSIS — F639 Impulse disorder, unspecified: Secondary | ICD-10-CM

## 2016-05-17 DIAGNOSIS — F411 Generalized anxiety disorder: Secondary | ICD-10-CM

## 2016-05-17 DIAGNOSIS — F919 Conduct disorder, unspecified: Secondary | ICD-10-CM

## 2016-05-17 NOTE — Progress Notes (Signed)
Location:   [facility] Room Number: N 24 Place of Service:  SNF (31) Provider:  Marlana Latus  NP    Patient Care Team: [name], MD as PCP - General (Internal Medicine) Verdene Lennert, DDS as Consulting Physician (Dentistry) [name], MD as Consulting Physician (Orthopedic Surgery) [place] Johanna Matto Otho Darner, NP as Nurse Practitioner (Nurse Practitioner)  Extended Emergency Contact Information Primary Emergency Contact: [name] of [place] Phone: [phone] Relation: Legal Guardian  Code Status:  DNR Goals of care: Advanced Directive information Advanced Directives 05/17/2016  Does patient have an advance directive? Yes  Type of Paramedic of [place];Living will;Out of facility DNR (pink MOST or yellow form)  Does patient want to make changes to advanced directive? No - Patient declined  Copy of advanced directive(s) in chart? Yes  Pre-existing out of facility DNR order (yellow form or pink MOST form) -     Chief Complaint  Patient presents with  . Acute Visit    Leg wound vs cellulitis    HPI:  Pt is a 82 y.o. female seen today for an acute visit for    Past Medical History:  Diagnosis Date  . Abnormality of gait   . AK (actinic keratosis)    right tip of nose and right posterior leg  . Anemia   . Arthritis   . Cancer ([place]) 08/07/12   squamous cell ca,keratoacanthoma-right posterior leg  . Hemorrhoids   . Hypertension   . Hypoxemia   . Insomnia, unspecified   . Osteoarthrosis, unspecified whether generalized or localized, unspecified site   . Radiation 09/10/2012-10/24/2012   30 fractions to right posterior calf  . Radiation 09/10/12-10/24/12   Squamous cell right posterior calf 6000 cGy  . Skin cancer 06/2011   scc right elbow tx included excision  . Spastic colon   . Thyroid disease   . Traumatic closed nondisp torus fracture of distal radial metaphysis 12/08/13  .  Unspecified hypothyroidism   . Unspecified urinary incontinence   . Varicose vein of leg    Past Surgical History:  Procedure Laterality Date  . ABDOMINAL HYSTERECTOMY     early 40's, abdominal  . anterior heel repair    . APPENDECTOMY    . FRACTURE SURGERY  2003   left   . pheochromocytoma     left  . TOTAL HIP ARTHROPLASTY  07/31/2010   right  . WRIST FRACTURE SURGERY  2003   left Dr. Newman Nip    No Known Allergies    Medication List       Accurate as of 05/17/16 12:36 PM. Always use your most recent med list.          acetaminophen 325 MG tablet Commonly known as:  TYLENOL Take 2 tablets (650 mg total) by mouth every 4 (four) hours as needed for mild pain (temperature >/= 99.5 F).   amoxicillin 500 MG capsule Commonly known as:  AMOXIL Take 2,000 mg by mouth. Before dental procedure .   aspirin 81 MG tablet Take 1 tablet (81 mg total) by mouth daily.   divalproex 125 MG capsule Commonly known as:  DEPAKOTE SPRINKLES Take 125 mg twice daily to help calm aggressive behavior   furosemide 20 MG tablet Commonly known as:  LASIX Take 20 mg by mouth daily.   lubriderm seriously sensitive Lotn Apply 1 application topically daily. For bilateral lower extremities   multivitamin tablet Take 1 tablet by mouth  daily.   nystatin powder Generic drug:  nystatin Apply topically 2 (two) times daily.   OXYGEN Inhale 2 L/min into the lungs as needed.   polyethylene glycol packet Commonly known as:  MIRALAX / GLYCOLAX Fill cap to 17gm mark, mix with 4-8 ounces of fluid and take by mouth every day as needed for constipation   risperiDONE 0.5 MG tablet Commonly known as:  RISPERDAL 1 nightly to control delusions and agitated behavior   sennosides-docusate sodium 8.6-50 MG tablet Commonly known as:  SENOKOT-S Take 1 tablet by mouth 2 (two) times daily.   sertraline 50 MG tablet Commonly known as:  ZOLOFT One daily to help depression   spironolactone 25 MG  tablet Commonly known as:  ALDACTONE Take 25 mg by mouth daily.       Review of Systems  Constitutional: Negative for appetite change, chills and diaphoresis.  HENT: Positive for hearing loss. Negative for congestion, ear discharge and nosebleeds.   Eyes: Negative.  Negative for pain, discharge and redness.       Watery eyes  Respiratory: Negative for cough, chest tightness, shortness of breath and wheezing.   Cardiovascular: Positive for leg swelling. Negative for chest pain and palpitations.  Gastrointestinal: Positive for constipation. Negative for abdominal distention, abdominal pain, blood in stool, diarrhea, nausea and rectal pain.  Genitourinary: Negative.  Negative for dysuria, flank pain and urgency.  Musculoskeletal: Positive for gait problem. Negative for back pain and neck pain.       Using 4 wheel walker with seat.  Skin:       Chronic changes of lower legs with darkening of the skin. Hx SCC of lower leg. Left lower leg, open wound, infected.     Neurological: Negative for dizziness, tremors and headaches.  Hematological: Negative.   Psychiatric/Behavioral: Positive for agitation, behavioral problems, confusion, decreased concentration and dysphoric mood. Negative for self-injury and suicidal ideas. The patient is nervous/anxious.        Agitated, confusion, resistance to nurses' assessment.     Immunization History  Administered Date(s) Administered  . Influenza Split 07/09/2013  . Influenza-Unspecified 07/13/2014, 07/14/2015  . PPD Test 11/17/2013  . Pneumococcal Polysaccharide-23 10/08/1998  . Td 10/08/2005  . Zoster 10/08/2005   Pertinent  Health Maintenance Due  Topic Date Due  . PNA vac Low Risk Adult (2 of 2 - PCV13) 10/09/1999  . INFLUENZA VACCINE  05/08/2016  . DEXA SCAN  Completed   Fall Risk  03/09/2016 03/08/2016 12/30/2012  Falls in the past year? Yes Yes No  Number falls in past yr: 2 or more 2 or more -  Injury with Fall? Yes No -  Risk Factor  Category  High Fall Risk - -  Risk for fall due to : History of fall(s);Impaired balance/gait;Impaired mobility - -  Follow up Falls evaluation completed - -   Functional Status Survey:    Vitals:   05/17/16 1107  BP: 128/82  Pulse: 68  Resp: 18  Temp: 98.7 F (37.1 C)  SpO2: 91%  Weight: 110 lb (49.9 kg)  Height: 5\' 2"  (1.575 m)   Body mass index is 20.12 kg/m. Physical Exam  Constitutional: She is oriented to person, place, and time.  frail  Eyes: EOM are normal. Pupils are equal, round, and reactive to light. Right eye exhibits no discharge. Left eye exhibits no discharge. No scleral icterus.  Watery eyes  Neck: No JVD present. No tracheal deviation present. No thyromegaly present.  Cardiovascular: Normal rate, regular rhythm, normal  heart sounds and intact distal pulses.  Exam reveals no gallop and no friction rub.   No murmur heard. Pulmonary/Chest: No respiratory distress. She has no wheezes. She has rales. She exhibits no tenderness.  Rales in the chest bilaterally.  Abdominal: Bowel sounds are normal. She exhibits no distension and no mass. There is no tenderness. There is no rebound.  Musculoskeletal: Normal range of motion. She exhibits edema. She exhibits no tenderness.  Unstable gait. Using 4 wheel walker with brakes and seat.2+ edema BLE  Lymphadenopathy:    She has no cervical adenopathy.  Neurological: She is alert and oriented to person, place, and time. No cranial nerve deficit. Coordination normal.  Memory loss 10/06/14 MMSE 10/30  Skin: No rash noted. No erythema. No pallor.  Lesion over the left scapula removed by Dermatology.  Chronic venous stasis changes of both lower legs. Scaling of skin. Left lower leg, open wound, infected.  2 cm umbilicated erythematous lesion to right chin, suspicious for squamous cell carcinoma   Psychiatric:  Confused, agitated. Difficult to redirect.    Labs reviewed:  Recent Labs  09/14/15 12/19/15  NA 140 139  K 4.5  4.8  BUN 24* 21  CREATININE 0.7 0.6    Recent Labs  09/14/15 12/19/15  AST 19 26  ALT 6* 6*  ALKPHOS 73 69    Recent Labs  09/14/15  WBC 4.8  HGB 13.5  HCT 41  PLT 174   Lab Results  Component Value Date   TSH 4.49 09/14/2015   Lab Results  Component Value Date   HGBA1C 5.9 09/14/2015   Lab Results  Component Value Date   CHOL 147 03/22/2016   HDL 62 03/22/2016   LDLCALC 74 03/22/2016   TRIG 54 03/22/2016    Significant Diagnostic Results in last 30 days:  No results found.  Assessment/Plan There are no diagnoses linked to this encounter.Infected open wound Left lower leg, Doxy 100mg  bid x 7 days, Silvadene cream bid until healed.    Essential hypertension Controlled, continue Furosemide, Spironolactone  Varicose veins of lower extremity Chronic, pigmentation.   Constipation Stable, continue MiraLax and Senna     Hypothyroidism 03/21/15 TSH 3.554.09/14/15 TSH 4.493.  Not taking thyroid supplement.    Dementia 10/06/14 MMSE 10/30, continue Risperdal, Depakote, Sertraline.     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      Family/ staff Communication: continue SNF for care assistance.   Labs/tests ordered:  none

## 2016-05-17 NOTE — Assessment & Plan Note (Signed)
Minimal in BLE, continue Furosemide and Spironolactone 

## 2016-05-17 NOTE — Assessment & Plan Note (Signed)
Controlled, continue Furosemide, Spironolactone 

## 2016-05-17 NOTE — Assessment & Plan Note (Signed)
Left lower leg, Doxy 100mg  bid x 7 days, Silvadene cream bid until healed.

## 2016-05-17 NOTE — Assessment & Plan Note (Signed)
Managed, continue Risperdal, Depakote, Sertraline

## 2016-05-17 NOTE — Assessment & Plan Note (Signed)
Chronic, pigmentation.

## 2016-05-17 NOTE — Assessment & Plan Note (Signed)
10/06/14 MMSE 10/30, continue Risperdal, Depakote, Sertraline.

## 2016-05-17 NOTE — Assessment & Plan Note (Signed)
03/21/15 TSH 3.554.09/14/15 TSH 4.493.  Not taking thyroid supplement.

## 2016-05-17 NOTE — Assessment & Plan Note (Signed)
Stable, continue Risperdal, Depakote, Sertraline

## 2016-05-17 NOTE — Assessment & Plan Note (Signed)
Stable, continue MiraLax and Senna

## 2016-06-12 ENCOUNTER — Encounter: Payer: Self-pay | Admitting: Nurse Practitioner

## 2016-06-12 ENCOUNTER — Non-Acute Institutional Stay (SKILLED_NURSING_FACILITY): Payer: Medicare Other | Admitting: Nurse Practitioner

## 2016-06-12 DIAGNOSIS — I8393 Asymptomatic varicose veins of bilateral lower extremities: Secondary | ICD-10-CM

## 2016-06-12 DIAGNOSIS — E039 Hypothyroidism, unspecified: Secondary | ICD-10-CM | POA: Diagnosis not present

## 2016-06-12 DIAGNOSIS — K5900 Constipation, unspecified: Secondary | ICD-10-CM

## 2016-06-12 DIAGNOSIS — I10 Essential (primary) hypertension: Secondary | ICD-10-CM | POA: Diagnosis not present

## 2016-06-12 DIAGNOSIS — IMO0002 Reserved for concepts with insufficient information to code with codable children: Secondary | ICD-10-CM | POA: Insufficient documentation

## 2016-06-12 DIAGNOSIS — F0391 Unspecified dementia with behavioral disturbance: Secondary | ICD-10-CM

## 2016-06-12 DIAGNOSIS — T148 Other injury of unspecified body region: Secondary | ICD-10-CM

## 2016-06-12 DIAGNOSIS — R609 Edema, unspecified: Secondary | ICD-10-CM

## 2016-06-12 DIAGNOSIS — I8390 Asymptomatic varicose veins of unspecified lower extremity: Secondary | ICD-10-CM

## 2016-06-12 NOTE — Assessment & Plan Note (Signed)
Chronic, pigmentation.

## 2016-06-12 NOTE — Progress Notes (Signed)
Location:   [name] Room Number: 24 Place of Service:  SNF (31) Provider:  Rhylee Nunn, [place]  NP  Jeanmarie Hubert, MD  Patient Care Team: [name], MD as PCP - General (Internal Medicine) Verdene Lennert, DDS as Consulting Physician (Dentistry) [name], MD as Consulting Physician (Orthopedic Surgery) [place] Dorothee Napierkowski Otho Darner, NP as Nurse Practitioner (Nurse Practitioner)  Extended Emergency Contact Information Primary Emergency Contact: [name] of [place] Phone: [phone] Relation: Legal Guardian  Code Status:  DNR Goals of care: Advanced Directive information Advanced Directives 06/12/2016  Does patient have an advance directive? Yes  Type of Paramedic of [place];Living will;Out of facility DNR (pink MOST or yellow form)  Does patient want to make changes to advanced directive? No - Patient declined  Copy of advanced directive(s) in chart? Yes  Pre-existing out of facility DNR order (yellow form or pink MOST form) -     Chief Complaint  Patient presents with  . Acute Visit    Skin tear to right arm    HPI:  Pt is a 82 y.o. female seen today for an acute visit for left upper arm and right forearm skin laceration, well approximated with steri strips, no s/s of infection, no decreased ROM of elbows, shoulders, or wrists.   Hx of agitation, combative behaviors, better in SNF, taking Depakote 125mg  bid, Risperdal 0.5mg  daily, Sertraline 50mg .  The patient has chronic BLE edema, no apparent increased SOB, sputum production, or O2 desaturation. Taking Furosemide 20mg , Spironolactone 25mg  daily. She uses prn Senokot S for constipation.    Past Medical History:  Diagnosis Date  . Abnormality of gait   . AK (actinic keratosis)    right tip of nose and right posterior leg  . Anemia   . Arthritis   . Cancer ([place]) 08/07/12   squamous cell ca,keratoacanthoma-right posterior leg  .  Hemorrhoids   . Hypertension   . Hypoxemia   . Insomnia, unspecified   . Osteoarthrosis, unspecified whether generalized or localized, unspecified site   . Radiation 09/10/2012-10/24/2012   30 fractions to right posterior calf  . Radiation 09/10/12-10/24/12   Squamous cell right posterior calf 6000 cGy  . Skin cancer 06/2011   scc right elbow tx included excision  . Spastic colon   . Thyroid disease   . Traumatic closed nondisp torus fracture of distal radial metaphysis 12/08/13  . Unspecified hypothyroidism   . Unspecified urinary incontinence   . Varicose vein of leg    Past Surgical History:  Procedure Laterality Date  . ABDOMINAL HYSTERECTOMY     early 40's, abdominal  . anterior heel repair    . APPENDECTOMY    . FRACTURE SURGERY  2003   left   . pheochromocytoma     left  . TOTAL HIP ARTHROPLASTY  07/31/2010   right  . WRIST FRACTURE SURGERY  2003   left Dr. Newman Nip    No Known Allergies    Medication List       Accurate as of 06/12/16  4:54 PM. Always use your most recent med list.          acetaminophen 325 MG tablet Commonly known as:  TYLENOL Take 2 tablets (650 mg total) by mouth every 4 (four) hours as needed for mild pain (temperature >/= 99.5 F).   amoxicillin 500 MG capsule Commonly known as:  AMOXIL Take 2,000 mg by mouth. Before dental procedure .  aspirin 81 MG tablet Take 1 tablet (81 mg total) by mouth daily.   divalproex 125 MG capsule Commonly known as:  DEPAKOTE SPRINKLES Take 125 mg twice daily to help calm aggressive behavior   furosemide 20 MG tablet Commonly known as:  LASIX Take 20 mg by mouth daily.   lubriderm seriously sensitive Lotn Apply 1 application topically daily. For bilateral lower extremities   multivitamin tablet Take 1 tablet by mouth daily.   nystatin powder Generic drug:  nystatin Apply topically 2 (two) times daily.   OXYGEN Inhale 2 L/min into the lungs as needed.   risperiDONE 0.5 MG  tablet Commonly known as:  RISPERDAL 1 nightly to control delusions and agitated behavior   sennosides-docusate sodium 8.6-50 MG tablet Commonly known as:  SENOKOT-S Take 1 tablet by mouth 2 (two) times daily.   sertraline 50 MG tablet Commonly known as:  ZOLOFT One daily to help depression   spironolactone 25 MG tablet Commonly known as:  ALDACTONE Take 25 mg by mouth daily.       Review of Systems  Constitutional: Negative for appetite change, chills and diaphoresis.  HENT: Positive for hearing loss. Negative for congestion, ear discharge and nosebleeds.   Eyes: Negative.  Negative for pain, discharge and redness.       Watery eyes  Respiratory: Negative for cough, chest tightness, shortness of breath and wheezing.   Cardiovascular: Positive for leg swelling. Negative for chest pain and palpitations.  Gastrointestinal: Positive for constipation. Negative for abdominal distention, abdominal pain, blood in stool, diarrhea, nausea and rectal pain.  Genitourinary: Negative.  Negative for dysuria, flank pain and urgency.  Musculoskeletal: Positive for gait problem. Negative for back pain and neck pain.       Using 4 wheel walker with seat.  Skin:       Chronic changes of lower legs with darkening of the skin. Hx SCC of lower leg. Large skin lacerations, left upper arm and right forearm, approximated with steri strips, no s/s of infection.     Neurological: Negative for dizziness, tremors and headaches.  Hematological: Negative.   Psychiatric/Behavioral: Positive for agitation, behavioral problems, confusion, decreased concentration and dysphoric mood. Negative for self-injury and suicidal ideas. The patient is nervous/anxious.        Agitated, confusion, resistance to nurses' assessment.     Immunization History  Administered Date(s) Administered  . Influenza Split 07/09/2013  . Influenza-Unspecified 07/13/2014, 07/14/2015  . PPD Test 11/17/2013  . Pneumococcal  Polysaccharide-23 10/08/1998  . Td 10/08/2005  . Zoster 10/08/2005   Pertinent  Health Maintenance Due  Topic Date Due  . PNA vac Low Risk Adult (2 of 2 - PCV13) 10/09/1999  . INFLUENZA VACCINE  05/08/2016  . DEXA SCAN  Completed   Fall Risk  03/09/2016 03/08/2016 12/30/2012  Falls in the past year? Yes Yes No  Number falls in past yr: 2 or more 2 or more -  Injury with Fall? Yes No -  Risk Factor Category  High Fall Risk - -  Risk for fall due to : History of fall(s);Impaired balance/gait;Impaired mobility - -  Follow up Falls evaluation completed - -   Functional Status Survey:    Vitals:   06/12/16 1332  BP: 124/77  Pulse: 82  Resp: 18  Temp: 97.7 F (36.5 C)  SpO2: 96%  Weight: 110 lb (49.9 kg)  Height: 5\' 2"  (1.575 m)   Body mass index is 20.12 kg/m. Physical Exam  Constitutional: She is oriented to  person, place, and time.  frail  Eyes: EOM are normal. Pupils are equal, round, and reactive to light. Right eye exhibits no discharge. Left eye exhibits no discharge. No scleral icterus.  Watery eyes  Neck: No JVD present. No tracheal deviation present. No thyromegaly present.  Cardiovascular: Normal rate, regular rhythm, normal heart sounds and intact distal pulses.  Exam reveals no gallop and no friction rub.   No murmur heard. Pulmonary/Chest: No respiratory distress. She has no wheezes. She has rales. She exhibits no tenderness.  Rales in the chest bilaterally.  Abdominal: Bowel sounds are normal. She exhibits no distension and no mass. There is no tenderness. There is no rebound.  Musculoskeletal: Normal range of motion. She exhibits edema. She exhibits no tenderness.  Unstable gait. Using 4 wheel walker with brakes and seat.2+ edema BLE  Lymphadenopathy:    She has no cervical adenopathy.  Neurological: She is alert and oriented to person, place, and time. No cranial nerve deficit. Coordination normal.  Memory loss 10/06/14 MMSE 10/30  Skin: No rash noted. No  erythema. No pallor.  Chronic changes of lower legs with darkening of the skin. Hx SCC of lower leg. Large skin lacerations, left upper arm and right forearm, approximated with steri strips, no s/s of infection.   Psychiatric:  Confused, agitated. Difficult to redirect.    Labs reviewed:  Recent Labs  09/14/15 12/19/15  NA 140 139  K 4.5 4.8  BUN 24* 21  CREATININE 0.7 0.6    Recent Labs  09/14/15 12/19/15  AST 19 26  ALT 6* 6*  ALKPHOS 73 69    Recent Labs  09/14/15  WBC 4.8  HGB 13.5  HCT 41  PLT 174   Lab Results  Component Value Date   TSH 4.49 09/14/2015   Lab Results  Component Value Date   HGBA1C 5.9 09/14/2015   Lab Results  Component Value Date   CHOL 147 03/22/2016   HDL 62 03/22/2016   LDLCALC 74 03/22/2016   TRIG 54 03/22/2016    Significant Diagnostic Results in last 30 days:  No results found.  Assessment/Plan There are no diagnoses linked to this encounter.Essential hypertension Controlled, continue Furosemide, Spironolactone   Varicose veins of lower extremity Chronic, pigmentation.    Constipation Stable, continue Senokot S   Hypothyroidism 03/21/15 TSH 3.554.09/14/15 TSH 4.493.  Not taking thyroid supplement.     Dementia 10/06/14 MMSE 10/30, continue Risperdal 0.5mg  daily, Depakote 125mg  bid,  Sertraline 50mg  daily.      Edema Minimal in BLE, continue Furosemide and Spironolactone.    Skin laceration Upper left arm and right forearm, well approximated with steri strips, no s/s of infection, continue protective sleeves. Observe.      Family/ staff Communication: continue SNF for care assistance.   Labs/tests ordered:  none

## 2016-06-12 NOTE — Assessment & Plan Note (Signed)
Controlled, continue Furosemide, Spironolactone 

## 2016-06-12 NOTE — Assessment & Plan Note (Signed)
Upper left arm and right forearm, well approximated with steri strips, no s/s of infection, continue protective sleeves. Observe.

## 2016-06-12 NOTE — Assessment & Plan Note (Signed)
03/21/15 TSH 3.554.09/14/15 TSH 4.493.  Not taking thyroid supplement.

## 2016-06-12 NOTE — Assessment & Plan Note (Signed)
Stable, continue Senokot S 

## 2016-06-12 NOTE — Assessment & Plan Note (Signed)
10/06/14 MMSE 10/30, continue Risperdal 0.5mg  daily, Depakote 125mg  bid,  Sertraline 50mg  daily.

## 2016-06-12 NOTE — Assessment & Plan Note (Signed)
Minimal in BLE, continue Furosemide and Spironolactone 

## 2016-06-18 ENCOUNTER — Non-Acute Institutional Stay (SKILLED_NURSING_FACILITY): Payer: Medicare Other | Admitting: Internal Medicine

## 2016-06-18 DIAGNOSIS — C4492 Squamous cell carcinoma of skin, unspecified: Secondary | ICD-10-CM | POA: Diagnosis not present

## 2016-06-18 DIAGNOSIS — I10 Essential (primary) hypertension: Secondary | ICD-10-CM | POA: Diagnosis not present

## 2016-06-18 DIAGNOSIS — F639 Impulse disorder, unspecified: Secondary | ICD-10-CM

## 2016-06-18 DIAGNOSIS — R269 Unspecified abnormalities of gait and mobility: Secondary | ICD-10-CM

## 2016-06-18 DIAGNOSIS — E039 Hypothyroidism, unspecified: Secondary | ICD-10-CM | POA: Diagnosis not present

## 2016-06-18 DIAGNOSIS — F919 Conduct disorder, unspecified: Secondary | ICD-10-CM

## 2016-06-18 DIAGNOSIS — F0391 Unspecified dementia with behavioral disturbance: Secondary | ICD-10-CM | POA: Diagnosis not present

## 2016-06-18 NOTE — Progress Notes (Signed)
Progress Note    Location:   [facility] Room Number: [id number] Place of Service:  SNF [phone]) Provider:  Jeanmarie Hubert, MD  Patient Care Team: [name], MD as PCP - General (Internal Medicine) Verdene Lennert, DDS as Consulting Physician (Dentistry) [name], MD as Consulting Physician (Orthopedic Surgery) [place] Man Otho Darner, NP as Nurse Practitioner (Nurse Practitioner)  Extended Emergency Contact Information Primary Emergency Contact: [name] of [place] Phone: [phone] Relation: Legal Guardian  Code Status:  DNR Goals of care: Advanced Directive information Advanced Directives 06/18/2016  Does patient have an advance directive? Yes  Type of Paramedic of [place];Living will;Out of facility DNR (pink MOST or yellow form)  Does patient want to make changes to advanced directive? -  Copy of advanced directive(s) in chart? Yes  Pre-existing out of facility DNR order (yellow form or pink MOST form) Yellow form placed in chart (order not valid for inpatient use);Pink MOST form placed in chart (order not valid for inpatient use)     Chief Complaint  Patient presents with  . Medical Management of Chronic Issues    routine    HPI:  Pt is a 82 y.o. female seen today for medical management of chronic diseases.    Dementia, with behavioral disturbance - Difficult to redirect at times.  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 Remains difficult to redirect at times.  Abnormality of gait - using walker regularly. Benefits from the assistance of one to stabilize.  Essential hypertension - controlled  Hypothyroidism, unspecified hypothyroidism type - compensated  Squamous cell skin cancer - no recurrence of lesion on the right calf which was removed in 2013     Past Medical History:  Diagnosis Date  .  Abnormality of gait   . AK (actinic keratosis)    right tip of nose and right posterior leg  . Anemia   . Arthritis   . Cancer ([place]) 08/07/12   squamous cell ca,keratoacanthoma-right posterior leg  . Hemorrhoids   . Hypertension   . Hypoxemia   . Insomnia, unspecified   . Osteoarthrosis, unspecified whether generalized or localized, unspecified site   . Radiation 09/10/2012-10/24/2012   30 fractions to right posterior calf  . Radiation 09/10/12-10/24/12   Squamous cell right posterior calf 6000 cGy  . Skin cancer 06/2011   scc right elbow tx included excision  . Spastic colon   . Thyroid disease   . Traumatic closed nondisp torus fracture of distal radial metaphysis 12/08/13  . Unspecified hypothyroidism   . Unspecified urinary incontinence   . Varicose vein of leg    Past Surgical History:  Procedure Laterality Date  . ABDOMINAL HYSTERECTOMY     early 40's, abdominal  . anterior heel repair    . APPENDECTOMY    . FRACTURE SURGERY  2003   left   . pheochromocytoma     left  . TOTAL HIP ARTHROPLASTY  07/31/2010   right  . WRIST FRACTURE SURGERY  2003   left Dr. Newman Nip    No Known Allergies    Medication List       Accurate as of 06/18/16  1:50 PM. Always use your most recent med list.          acetaminophen 325 MG tablet Commonly known as:  TYLENOL Take 2 tablets (650 mg total) by mouth every 4 (four)  hours as needed for mild pain (temperature >/= 99.5 F).   amoxicillin 500 MG capsule Commonly known as:  AMOXIL Take 2,000 mg by mouth. Before dental procedure .   aspirin 81 MG tablet Take 1 tablet (81 mg total) by mouth daily.   divalproex 125 MG capsule Commonly known as:  DEPAKOTE SPRINKLES Take 125 mg twice daily to help calm aggressive behavior   furosemide 20 MG tablet Commonly known as:  LASIX Take 20 mg by mouth daily.   lubriderm seriously sensitive Lotn Apply 1 application topically daily. For bilateral lower extremities   multivitamin  tablet Take 1 tablet by mouth daily.   nystatin powder Generic drug:  nystatin Apply topically 2 (two) times daily.   OXYGEN Inhale 2 L/min into the lungs as needed.   risperiDONE 0.5 MG tablet Commonly known as:  RISPERDAL 1 nightly to control delusions and agitated behavior   sennosides-docusate sodium 8.6-50 MG tablet Commonly known as:  SENOKOT-S Take 1 tablet by mouth 2 (two) times daily.   sertraline 50 MG tablet Commonly known as:  ZOLOFT One daily to help depression   silver sulfADIAZINE 1 % cream Commonly known as:  SILVADENE Apply 1 application topically. Apply to ble open wounds daily   spironolactone 25 MG tablet Commonly known as:  ALDACTONE Take 25 mg by mouth daily.       Review of Systems  Constitutional: Negative for appetite change, chills and diaphoresis.  HENT: Negative.   Eyes: Negative.   Respiratory: Negative for cough and chest tightness.   Cardiovascular: Negative for chest pain, palpitations and leg swelling.  Gastrointestinal: Positive for constipation. Negative for abdominal distention, abdominal pain, blood in stool, diarrhea, nausea and rectal pain.  Genitourinary: Negative.   Musculoskeletal: Positive for gait problem.       Using 4 wheel walker with seat. Painful left knee.  Skin:       Chronic changes of lower legs with darkening of the skin. Hx SCC of lower leg. Chronic itching.  Hematological: Negative.   Psychiatric/Behavioral: Positive for agitation, behavioral problems, confusion, decreased concentration and dysphoric mood. Negative for self-injury and suicidal ideas. The patient is nervous/anxious.     Immunization History  Administered Date(s) Administered  . Influenza Split 07/09/2013  . Influenza-Unspecified 07/13/2014, 07/14/2015  . PPD Test 11/17/2013  . Pneumococcal Polysaccharide-23 10/08/1998  . Td 10/08/2005  . Zoster 10/08/2005   Pertinent  Health Maintenance Due  Topic Date Due  . PNA vac Low Risk Adult (2  of 2 - PCV13) 10/09/1999  . INFLUENZA VACCINE  05/08/2016  . DEXA SCAN  Completed   Fall Risk  03/09/2016 03/08/2016 12/30/2012  Falls in the past year? Yes Yes No  Number falls in past yr: 2 or more 2 or more -  Injury with Fall? Yes No -  Risk Factor Category  High Fall Risk - -  Risk for fall due to : History of fall(s);Impaired balance/gait;Impaired mobility - -  Follow up Falls evaluation completed - -   Functional Status Survey:    Vitals:   06/18/16 1341  BP: 124/77  Pulse: 82  Resp: 18  Temp: 97.7 F (36.5 C)  SpO2: 96%  Weight: 108 lb 9.6 oz (49.3 kg)  Height: 5\' 2"  (1.575 m)   Body mass index is 19.86 kg/m. Physical Exam  Constitutional: She is oriented to person, place, and time.  frail  Eyes: EOM are normal. Pupils are equal, round, and reactive to light. Right eye exhibits no discharge.  Left eye exhibits no discharge. No scleral icterus.  Watery eyes  Neck: No JVD present. No tracheal deviation present. No thyromegaly present.  Cardiovascular: Normal rate, regular rhythm, normal heart sounds and intact distal pulses.  Exam reveals no gallop and no friction rub.   No murmur heard. Pulmonary/Chest: No respiratory distress. She has no wheezes. She has rales. She exhibits no tenderness.  Rales in the chest bilaterally.  Abdominal: Bowel sounds are normal. She exhibits no distension and no mass. There is no tenderness. There is no rebound.  Musculoskeletal: Normal range of motion. She exhibits edema. She exhibits no tenderness.  Unstable gait. Using 4 wheel walker with brakes and seat.2+ edema BLE  Lymphadenopathy:    She has no cervical adenopathy.  Neurological: She is alert and oriented to person, place, and time. No cranial nerve deficit. Coordination normal.  Memory loss 10/06/14 MMSE 10/30  Skin: No rash noted. No erythema. No pallor.  Prior lesion over the left scapula removed by Dermatology.  Chronic venous stasis changes of both lower legs. Scaling of  skin. 2 cm umbilicated erythematous lesion to right chin, suspicious for squamous cell carcinoma   Psychiatric:  Confused, agitated. Difficult to redirect.    Labs reviewed:  Recent Labs  09/14/15 12/19/15  NA 140 139  K 4.5 4.8  BUN 24* 21  CREATININE 0.7 0.6    Recent Labs  09/14/15 12/19/15  AST 19 26  ALT 6* 6*  ALKPHOS 73 69    Recent Labs  09/14/15  WBC 4.8  HGB 13.5  HCT 41  PLT 174   Lab Results  Component Value Date   TSH 4.49 09/14/2015   Lab Results  Component Value Date   HGBA1C 5.9 09/14/2015   Lab Results  Component Value Date   CHOL 147 03/22/2016   HDL 62 03/22/2016   LDLCALC 74 03/22/2016   TRIG 54 03/22/2016    Assessment/Plan 1. Dementia, with behavioral disturbance unchanged  2. 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 Abnormality of gait Use walker  4. Essential hypertension controlled  5. Hypothyroidism, unspecified hypothyroidism type compensated  6. Squamous cell skin cancer No relapse of prior cancer of the posterior aspect of the right calf

## 2016-06-28 DIAGNOSIS — I10 Essential (primary) hypertension: Secondary | ICD-10-CM | POA: Diagnosis not present

## 2016-06-28 LAB — HEMOGLOBIN A1C: HEMOGLOBIN A1C: 5.5

## 2016-07-03 ENCOUNTER — Other Ambulatory Visit: Payer: Self-pay | Admitting: *Deleted

## 2016-07-05 DIAGNOSIS — R296 Repeated falls: Secondary | ICD-10-CM | POA: Diagnosis not present

## 2016-07-05 DIAGNOSIS — R1312 Dysphagia, oropharyngeal phase: Secondary | ICD-10-CM | POA: Diagnosis not present

## 2016-07-05 DIAGNOSIS — R29898 Other symptoms and signs involving the musculoskeletal system: Secondary | ICD-10-CM | POA: Diagnosis not present

## 2016-07-12 DIAGNOSIS — R29898 Other symptoms and signs involving the musculoskeletal system: Secondary | ICD-10-CM | POA: Diagnosis not present

## 2016-07-12 DIAGNOSIS — R296 Repeated falls: Secondary | ICD-10-CM | POA: Diagnosis not present

## 2016-07-12 DIAGNOSIS — M6281 Muscle weakness (generalized): Secondary | ICD-10-CM | POA: Diagnosis not present

## 2016-07-16 DIAGNOSIS — R29898 Other symptoms and signs involving the musculoskeletal system: Secondary | ICD-10-CM | POA: Diagnosis not present

## 2016-07-16 DIAGNOSIS — R296 Repeated falls: Secondary | ICD-10-CM | POA: Diagnosis not present

## 2016-07-16 DIAGNOSIS — M6281 Muscle weakness (generalized): Secondary | ICD-10-CM | POA: Diagnosis not present

## 2016-07-17 DIAGNOSIS — M6281 Muscle weakness (generalized): Secondary | ICD-10-CM | POA: Diagnosis not present

## 2016-07-17 DIAGNOSIS — R296 Repeated falls: Secondary | ICD-10-CM | POA: Diagnosis not present

## 2016-07-17 DIAGNOSIS — R29898 Other symptoms and signs involving the musculoskeletal system: Secondary | ICD-10-CM | POA: Diagnosis not present

## 2016-07-19 ENCOUNTER — Non-Acute Institutional Stay (SKILLED_NURSING_FACILITY): Payer: Medicare Other | Admitting: Internal Medicine

## 2016-07-19 ENCOUNTER — Encounter: Payer: Self-pay | Admitting: Internal Medicine

## 2016-07-19 DIAGNOSIS — S91002A Unspecified open wound, left ankle, initial encounter: Secondary | ICD-10-CM

## 2016-07-19 DIAGNOSIS — R296 Repeated falls: Secondary | ICD-10-CM | POA: Diagnosis not present

## 2016-07-19 DIAGNOSIS — S81802A Unspecified open wound, left lower leg, initial encounter: Secondary | ICD-10-CM

## 2016-07-19 DIAGNOSIS — M6281 Muscle weakness (generalized): Secondary | ICD-10-CM | POA: Diagnosis not present

## 2016-07-19 DIAGNOSIS — S81002A Unspecified open wound, left knee, initial encounter: Secondary | ICD-10-CM | POA: Diagnosis not present

## 2016-07-19 DIAGNOSIS — R29898 Other symptoms and signs involving the musculoskeletal system: Secondary | ICD-10-CM | POA: Diagnosis not present

## 2016-07-19 NOTE — Progress Notes (Signed)
Progress Note    Location:  [facility] Room Number: [id number] Place of Service:  SNF [phone]) Provider:  Jeanmarie Hubert, MD  Patient Care Team: [name], MD as PCP - General (Internal Medicine) Verdene Lennert, DDS as Consulting Physician (Dentistry) [name], MD as Consulting Physician (Orthopedic Surgery) [place] Man Otho Darner, NP as Nurse Practitioner (Nurse Practitioner)  Extended Emergency Contact Information Primary Emergency Contact: [name] of [place] Phone: [phone] Relation: Legal Guardian  Code Status:  DNR Goals of care: Advanced Directive information Advanced Directives 07/19/2016  Does patient have an advance directive? Yes  Type of Paramedic of [place];Living will;Out of facility DNR (pink MOST or yellow form)  Does patient want to make changes to advanced directive? -  Copy of advanced directive(s) in chart? Yes  Pre-existing out of facility DNR order (yellow form or pink MOST form) Yellow form placed in chart (order not valid for inpatient use)     Chief Complaint  Patient presents with  . Acute Visit    skin tear to left lower leg has a foul odor, Sederick Jacobsen drainage    HPI:  Pt is a 82 y.o. female seen today for an acute visit for evaluation o f acute skin tear of the left lower leg. Staff reports a Jarissa Sheriff drainage and foul odor. The area is tender when bandage is removed for inspection of the wound. Afebrile.   Past Medical History:  Diagnosis Date  . Abnormality of gait   . AK (actinic keratosis)    right tip of nose and right posterior leg  . Anemia   . Arthritis   . Cancer ([place]) 08/07/12   squamous cell ca,keratoacanthoma-right posterior leg  . Hemorrhoids   . Hypertension   . Hypoxemia   . Insomnia, unspecified   . Osteoarthrosis, unspecified whether generalized or localized, unspecified site   . Radiation 09/10/2012-10/24/2012   30 fractions to right  posterior calf  . Radiation 09/10/12-10/24/12   Squamous cell right posterior calf 6000 cGy  . Skin cancer 06/2011   scc right elbow tx included excision  . Spastic colon   . Thyroid disease   . Traumatic closed nondisp torus fracture of distal radial metaphysis 12/08/13  . Unspecified hypothyroidism   . Unspecified urinary incontinence   . Varicose vein of leg    Past Surgical History:  Procedure Laterality Date  . ABDOMINAL HYSTERECTOMY     early 40's, abdominal  . anterior heel repair    . APPENDECTOMY    . FRACTURE SURGERY  2003   left   . pheochromocytoma     left  . TOTAL HIP ARTHROPLASTY  07/31/2010   right  . WRIST FRACTURE SURGERY  2003   left Dr. Newman Nip    No Known Allergies    Medication List       Accurate as of 07/19/16 10:33 AM. Always use your most recent med list.          acetaminophen 325 MG tablet Commonly known as:  TYLENOL Take 2 tablets (650 mg total) by mouth every 4 (four) hours as needed for mild pain (temperature >/= 99.5 F).   amoxicillin 500 MG capsule Commonly known as:  AMOXIL Take 2,000 mg by mouth. Before dental procedure .   aspirin 81 MG tablet Take 1 tablet (81 mg total) by mouth daily.   divalproex 125 MG capsule Commonly known as:  DEPAKOTE SPRINKLES Take 125 mg  twice daily to help calm aggressive behavior   furosemide 20 MG tablet Commonly known as:  LASIX Take 20 mg by mouth daily.   lubriderm seriously sensitive Lotn Apply 1 application topically daily. For bilateral lower extremities   multivitamin tablet Take 1 tablet by mouth daily.   OXYGEN Inhale 2 L/min into the lungs as needed.   risperiDONE 0.5 MG tablet Commonly known as:  RISPERDAL 1 nightly to control delusions and agitated behavior   sennosides-docusate sodium 8.6-50 MG tablet Commonly known as:  SENOKOT-S Take 1 tablet by mouth 2 (two) times daily.   sertraline 50 MG tablet Commonly known as:  ZOLOFT One daily to help depression     spironolactone 25 MG tablet Commonly known as:  ALDACTONE Take 25 mg by mouth daily.       Review of Systems  Constitutional: Negative for appetite change, chills and diaphoresis.  HENT: Negative.   Eyes: Negative.   Respiratory: Negative for cough and chest tightness.   Cardiovascular: Negative for chest pain, palpitations and leg swelling.  Gastrointestinal: Positive for constipation. Negative for abdominal distention, abdominal pain, blood in stool, diarrhea, nausea and rectal pain.  Genitourinary: Negative.   Musculoskeletal: Positive for gait problem.       Using 4 wheel walker with seat. Painful left knee.  Skin:       Chronic changes of lower legs with darkening of the skin. Hx SCC of lower leg. Chronic itching. Recent skin tear of the left lower leg anterolaterally. Some bloody drainage.   Hematological: Negative.   Psychiatric/Behavioral: Positive for agitation, behavioral problems, confusion, decreased concentration and dysphoric mood. Negative for self-injury and suicidal ideas. The patient is nervous/anxious.     Immunization History  Administered Date(s) Administered  . Influenza Split 07/09/2013  . Influenza-Unspecified 07/13/2014, 07/14/2015  . PPD Test 11/17/2013  . Pneumococcal Polysaccharide-23 10/08/1998  . Td 10/08/2005  . Zoster 10/08/2005   Pertinent  Health Maintenance Due  Topic Date Due  . PNA vac Low Risk Adult (2 of 2 - PCV13) 10/09/1999  . INFLUENZA VACCINE  05/08/2016  . DEXA SCAN  Completed   Fall Risk  03/09/2016 03/08/2016 12/30/2012  Falls in the past year? Yes Yes No  Number falls in past yr: 2 or more 2 or more -  Injury with Fall? Yes No -  Risk Factor Category  High Fall Risk - -  Risk for fall due to : History of fall(s);Impaired balance/gait;Impaired mobility - -  Follow up Falls evaluation completed - -   Functional Status Survey:    Vitals:   07/19/16 1018  BP: 120/62  Pulse: 84  Resp: (!) 84  Temp: 97.9 F (36.6 C)   SpO2: (!) 83%  Weight: 107 lb (48.5 kg)  Height: 5\' 2"  (1.575 m)   Body mass index is 19.57 kg/m. Physical Exam  Constitutional: She is oriented to person, place, and time.  frail  Eyes: EOM are normal. Pupils are equal, round, and reactive to light. Right eye exhibits no discharge. Left eye exhibits no discharge. No scleral icterus.  Watery eyes  Neck: No JVD present. No tracheal deviation present. No thyromegaly present.  Cardiovascular: Normal rate, regular rhythm, normal heart sounds and intact distal pulses.  Exam reveals no gallop and no friction rub.   No murmur heard. Pulmonary/Chest: No respiratory distress. She has no wheezes. She has rales. She exhibits no tenderness.  Rales in the chest bilaterally.  Abdominal: Bowel sounds are normal. She exhibits no distension and  no mass. There is no tenderness. There is no rebound.  Musculoskeletal: Normal range of motion. She exhibits edema. She exhibits no tenderness.  Unstable gait. Using 4 wheel walker with brakes and seat.2+ edema BLE  Lymphadenopathy:    She has no cervical adenopathy.  Neurological: She is alert and oriented to person, place, and time. No cranial nerve deficit. Coordination normal.  Memory loss 10/06/14 MMSE 10/30  Skin: No rash noted. No erythema. No pallor.  Prior lesion over the left scapula removed by Dermatology.  Chronic venous stasis changes of both lower legs. Scaling of skin. 2 cm umbilicated erythematous lesion to right chin, suspicious for squamous cell carcinoma  Acute skin tear of the left lower leg with some bloody drainage.  Psychiatric:  Confused, agitated. Difficult to redirect.    Labs reviewed:  Recent Labs  09/14/15 12/19/15  NA 140 139  K 4.5 4.8  BUN 24* 21  CREATININE 0.7 0.6    Recent Labs  09/14/15 12/19/15  AST 19 26  ALT 6* 6*  ALKPHOS 73 69    Recent Labs  09/14/15  WBC 4.8  HGB 13.5  HCT 41  PLT 174   Lab Results  Component Value Date   TSH 4.49  09/14/2015   Lab Results  Component Value Date   HGBA1C 5.5 06/28/2016   Lab Results  Component Value Date   CHOL 147 03/22/2016   HDL 62 03/22/2016   LDLCALC 74 03/22/2016   TRIG 54 03/22/2016    Significant Diagnostic Results in last 30 days:  No results found.  Assessment/Plan 1. Open wound of left knee, leg, and ankle with complication, initial encounter -culture -cephalexin 50 mg tid x 7d

## 2016-07-24 DIAGNOSIS — R29898 Other symptoms and signs involving the musculoskeletal system: Secondary | ICD-10-CM | POA: Diagnosis not present

## 2016-07-24 DIAGNOSIS — R296 Repeated falls: Secondary | ICD-10-CM | POA: Diagnosis not present

## 2016-07-24 DIAGNOSIS — M6281 Muscle weakness (generalized): Secondary | ICD-10-CM | POA: Diagnosis not present

## 2016-08-21 ENCOUNTER — Non-Acute Institutional Stay (SKILLED_NURSING_FACILITY): Payer: Medicare Other | Admitting: Nurse Practitioner

## 2016-08-21 ENCOUNTER — Encounter: Payer: Self-pay | Admitting: Nurse Practitioner

## 2016-08-21 DIAGNOSIS — K5900 Constipation, unspecified: Secondary | ICD-10-CM

## 2016-08-21 DIAGNOSIS — I8390 Asymptomatic varicose veins of unspecified lower extremity: Secondary | ICD-10-CM

## 2016-08-21 DIAGNOSIS — E039 Hypothyroidism, unspecified: Secondary | ICD-10-CM | POA: Diagnosis not present

## 2016-08-21 DIAGNOSIS — F411 Generalized anxiety disorder: Secondary | ICD-10-CM

## 2016-08-21 DIAGNOSIS — F22 Delusional disorders: Secondary | ICD-10-CM

## 2016-08-21 DIAGNOSIS — R609 Edema, unspecified: Secondary | ICD-10-CM | POA: Diagnosis not present

## 2016-08-21 DIAGNOSIS — F0391 Unspecified dementia with behavioral disturbance: Secondary | ICD-10-CM | POA: Diagnosis not present

## 2016-08-21 DIAGNOSIS — I10 Essential (primary) hypertension: Secondary | ICD-10-CM

## 2016-08-21 NOTE — Progress Notes (Signed)
Location:  [facility] Room Number: 24 Place of Service:  SNF (31) Provider: Sherronda Sweigert, [place]  NP  Jeanmarie Hubert, MD  Patient Care Team: [name], MD as PCP - General (Internal Medicine) Verdene Lennert, DDS as Consulting Physician (Dentistry) [name], MD as Consulting Physician (Orthopedic Surgery) [place] Don Giarrusso Otho Darner, NP as Nurse Practitioner (Nurse Practitioner)  Extended Emergency Contact Information Primary Emergency Contact: [name] of [place] Phone: [phone] Relation: Legal Guardian  Code Status:  DNR Goals of care: Advanced Directive information Advanced Directives 08/21/2016  Does patient have an advance directive? Yes  Type of Paramedic of [place];Living will;Out of facility DNR (pink MOST or yellow form)  Does patient want to make changes to advanced directive? No - Patient declined  Copy of advanced directive(s) in chart? Yes  Pre-existing out of facility DNR order (yellow form or pink MOST form) -     Chief Complaint  Patient presents with  . Medical Management of Chronic Issues    HPI:  Pt is a 82 y.o. female seen today for medical management of chronic diseases.     Hx of agitation, combative behaviors, better in SNF, taking Depakote 125mg  bid, Risperdal 0.25mg  daily, Sertraline 50mg .  The patient has chronic BLE edema, no apparent increased SOB, sputum production, or O2 desaturation. Taking Furosemide 20mg , Spironolactone 25mg  daily. She uses bid Senokot S for constipation.    Past Medical History:  Diagnosis Date  . Abnormality of gait   . AK (actinic keratosis)    right tip of nose and right posterior leg  . Anemia   . Arthritis   . Cancer ([place]) 08/07/12   squamous cell ca,keratoacanthoma-right posterior leg  . Hemorrhoids   . Hypertension   . Hypoxemia   . Insomnia, unspecified   . Osteoarthrosis, unspecified whether generalized or localized,  unspecified site   . Radiation 09/10/2012-10/24/2012   30 fractions to right posterior calf  . Radiation 09/10/12-10/24/12   Squamous cell right posterior calf 6000 cGy  . Skin cancer 06/2011   scc right elbow tx included excision  . Spastic colon   . Thyroid disease   . Traumatic closed nondisp torus fracture of distal radial metaphysis 12/08/13  . Unspecified hypothyroidism   . Unspecified urinary incontinence   . Varicose vein of leg    Past Surgical History:  Procedure Laterality Date  . ABDOMINAL HYSTERECTOMY     early 40's, abdominal  . anterior heel repair    . APPENDECTOMY    . FRACTURE SURGERY  2003   left   . pheochromocytoma     left  . TOTAL HIP ARTHROPLASTY  07/31/2010   right  . WRIST FRACTURE SURGERY  2003   left Dr. Newman Nip    No Known Allergies    Medication List       Accurate as of 08/21/16  2:16 PM. Always use your most recent med list.          acetaminophen 325 MG tablet Commonly known as:  TYLENOL Take 2 tablets (650 mg total) by mouth every 4 (four) hours as needed for mild pain (temperature >/= 99.5 F).   amoxicillin 500 MG capsule Commonly known as:  AMOXIL Take 2,000 mg by mouth. Before dental procedure .   aspirin 81 MG tablet Take 1 tablet (81 mg total) by mouth daily.   divalproex 125 MG capsule Commonly known as:  DEPAKOTE SPRINKLES Take 125 mg twice  daily to help calm aggressive behavior   furosemide 20 MG tablet Commonly known as:  LASIX Take 20 mg by mouth daily.   lubriderm seriously sensitive Lotn Apply 1 application topically daily. For bilateral lower extremities   multivitamin tablet Take 1 tablet by mouth daily.   OXYGEN Inhale 2 L/min into the lungs as needed.   risperiDONE 0.5 MG tablet Commonly known as:  RISPERDAL 1 nightly to control delusions and agitated behavior   sennosides-docusate sodium 8.6-50 MG tablet Commonly known as:  SENOKOT-S Take 1 tablet by mouth 2 (two) times daily.   sertraline  50 MG tablet Commonly known as:  ZOLOFT One daily to help depression   spironolactone 25 MG tablet Commonly known as:  ALDACTONE Take 25 mg by mouth daily.       Review of Systems  Constitutional: Negative for appetite change, chills and diaphoresis.  HENT: Negative.   Eyes: Negative.   Respiratory: Negative for cough and chest tightness.   Cardiovascular: Negative for chest pain, palpitations and leg swelling.  Gastrointestinal: Positive for constipation. Negative for abdominal distention, abdominal pain, blood in stool, diarrhea, nausea and rectal pain.  Genitourinary: Negative.   Musculoskeletal: Positive for gait problem.       Using 4 wheel walker with seat. Painful left knee.  Skin:       Chronic changes of lower legs with darkening of the skin. Hx SCC of lower leg. Chronic itching. Recent skin tear of the left lower leg anterolaterally. Some bloody drainage.   Hematological: Negative.   Psychiatric/Behavioral: Positive for agitation, behavioral problems, confusion, decreased concentration and dysphoric mood. Negative for self-injury and suicidal ideas. The patient is nervous/anxious.     Immunization History  Administered Date(s) Administered  . Influenza Split 07/09/2013  . Influenza-Unspecified 07/13/2014, 07/14/2015, 07/20/2016  . PPD Test 11/17/2013, 03/10/2016  . Pneumococcal Polysaccharide-23 10/08/1998  . Td 10/08/2005  . Zoster 10/08/2005   Pertinent  Health Maintenance Due  Topic Date Due  . PNA vac Low Risk Adult (2 of 2 - PCV13) 10/09/1999  . INFLUENZA VACCINE  Completed  . DEXA SCAN  Completed   Fall Risk  03/09/2016 03/08/2016 12/30/2012  Falls in the past year? Yes Yes No  Number falls in past yr: 2 or more 2 or more -  Injury with Fall? Yes No -  Risk Factor Category  High Fall Risk - -  Risk for fall due to : History of fall(s);Impaired balance/gait;Impaired mobility - -  Follow up Falls evaluation completed - -   Functional Status Survey:     Vitals:   08/21/16 1035  BP: 120/80  Pulse: 93  Resp: 18  Temp: 97.8 F (36.6 C)  SpO2: 92%  Weight: 106 lb 12.8 oz (48.4 kg)  Height: 5\' 2"  (1.575 m)   Body mass index is 19.53 kg/m. Physical Exam  Constitutional: She is oriented to person, place, and time.  frail  Eyes: EOM are normal. Pupils are equal, round, and reactive to light. Right eye exhibits no discharge. Left eye exhibits no discharge. No scleral icterus.  Watery eyes  Neck: No JVD present. No tracheal deviation present. No thyromegaly present.  Cardiovascular: Normal rate, regular rhythm, normal heart sounds and intact distal pulses.  Exam reveals no gallop and no friction rub.   No murmur heard. Pulmonary/Chest: No respiratory distress. She has no wheezes. She has rales. She exhibits no tenderness.  Rales in the chest bilaterally.  Abdominal: Bowel sounds are normal. She exhibits no distension and  no mass. There is no tenderness. There is no rebound.  Musculoskeletal: Normal range of motion. She exhibits edema. She exhibits no tenderness.  Unstable gait. Using 4 wheel walker with brakes and seat.2+ edema BLE  Lymphadenopathy:    She has no cervical adenopathy.  Neurological: She is alert and oriented to person, place, and time. No cranial nerve deficit. Coordination normal.  Memory loss 10/06/14 MMSE 10/30  Skin: No rash noted. No erythema. No pallor.  Prior lesion over the left scapula removed by Dermatology.  Chronic venous stasis changes of both lower legs. Scaling of skin. 2 cm umbilicated erythematous lesion to right chin, suspicious for squamous cell carcinoma  Acute skin tear RUE, well approximated, no s/s of cellulitis.   Psychiatric:  Confused, agitated. Difficult to redirect.    Labs reviewed:  Recent Labs  09/14/15 12/19/15  NA 140 139  K 4.5 4.8  BUN 24* 21  CREATININE 0.7 0.6    Recent Labs  09/14/15 12/19/15  AST 19 26  ALT 6* 6*  ALKPHOS 73 69    Recent Labs  09/14/15  WBC  4.8  HGB 13.5  HCT 41  PLT 174   Lab Results  Component Value Date   TSH 4.49 09/14/2015   Lab Results  Component Value Date   HGBA1C 5.5 06/28/2016   Lab Results  Component Value Date   CHOL 147 03/22/2016   HDL 62 03/22/2016   LDLCALC 74 03/22/2016   TRIG 54 03/22/2016    Significant Diagnostic Results in last 30 days:  No results found.  Assessment/Plan Essential hypertension Controlled, continue Furosemide, Spironolactone   Varicose veins of lower extremity Chronic, pigmentation.    Constipation Stable, continue Senokot S  Hypothyroidism 03/21/15 TSH 3.554.09/14/15 TSH 4.493.  Not taking thyroid supplement.  Dementia 10/06/14 MMSE 10/30, continue Risperdal 0.25mg  daily, Depakote 125mg  bid,  Sertraline 50mg  daily.   Generalized anxiety disorder Managed, continue Risperdal, Depakote, Sertraline  Delusions (HCC) Stable, continue Risperdal, Depakote, Sertraline   Edema Minimal in BLE, continue Furosemide and Spironolactone.        Family/ staff Communication: continue SNF  Labs/tests ordered:  none

## 2016-08-21 NOTE — Assessment & Plan Note (Signed)
Chronic, pigmentation.

## 2016-08-21 NOTE — Assessment & Plan Note (Signed)
03/21/15 TSH 3.554.09/14/15 TSH 4.493.  Not taking thyroid supplement.

## 2016-08-21 NOTE — Assessment & Plan Note (Signed)
Minimal in BLE, continue Furosemide and Spironolactone 

## 2016-08-21 NOTE — Assessment & Plan Note (Signed)
Controlled, continue Furosemide, Spironolactone 

## 2016-08-21 NOTE — Assessment & Plan Note (Signed)
10/06/14 MMSE 10/30, continue Risperdal 0.25mg  daily, Depakote 125mg  bid,  Sertraline 50mg  daily.

## 2016-08-21 NOTE — Assessment & Plan Note (Signed)
Stable, continue Senokot S

## 2016-08-21 NOTE — Assessment & Plan Note (Signed)
Managed, continue Risperdal, Depakote, Sertraline

## 2016-08-21 NOTE — Assessment & Plan Note (Signed)
Stable, continue Risperdal, Depakote, Sertraline

## 2016-09-13 ENCOUNTER — Telehealth: Payer: Self-pay

## 2016-09-13 NOTE — Telephone Encounter (Signed)
Notice of denial of medicare prescription drug coverage for Nuedexta received via fax.  Why request was denied: Based on information about patients health conditions we are unable to approve Nuedexta.  Fax was placed in [place] review and sign folder

## 2016-09-14 ENCOUNTER — Non-Acute Institutional Stay (SKILLED_NURSING_FACILITY): Payer: Medicare Other | Admitting: Nurse Practitioner

## 2016-09-14 ENCOUNTER — Encounter: Payer: Self-pay | Admitting: Nurse Practitioner

## 2016-09-14 DIAGNOSIS — R609 Edema, unspecified: Secondary | ICD-10-CM | POA: Diagnosis not present

## 2016-09-14 DIAGNOSIS — I10 Essential (primary) hypertension: Secondary | ICD-10-CM | POA: Diagnosis not present

## 2016-09-14 DIAGNOSIS — E039 Hypothyroidism, unspecified: Secondary | ICD-10-CM

## 2016-09-14 DIAGNOSIS — R634 Abnormal weight loss: Secondary | ICD-10-CM

## 2016-09-14 DIAGNOSIS — I872 Venous insufficiency (chronic) (peripheral): Secondary | ICD-10-CM | POA: Diagnosis not present

## 2016-09-14 DIAGNOSIS — F411 Generalized anxiety disorder: Secondary | ICD-10-CM

## 2016-09-14 DIAGNOSIS — F482 Pseudobulbar affect: Secondary | ICD-10-CM

## 2016-09-14 DIAGNOSIS — E785 Hyperlipidemia, unspecified: Secondary | ICD-10-CM | POA: Diagnosis not present

## 2016-09-14 DIAGNOSIS — F0150 Vascular dementia without behavioral disturbance: Secondary | ICD-10-CM | POA: Diagnosis not present

## 2016-09-14 DIAGNOSIS — F320 Major depressive disorder, single episode, mild: Secondary | ICD-10-CM

## 2016-09-14 NOTE — Assessment & Plan Note (Signed)
Persisted behaviors, increase Depakote 250mg  bid, continue Sertraline and Risperdal.

## 2016-09-14 NOTE — Assessment & Plan Note (Signed)
Minimal in BLE, continue Furosemide and Spironolactone. Update CMP

## 2016-09-14 NOTE — Assessment & Plan Note (Signed)
Persisted emotional outburst, continue Sertraline 50mg , Risperdal 0.125mg , increase Depakote to 250mg  bid

## 2016-09-14 NOTE — Assessment & Plan Note (Signed)
Behavioral issues, continue SNF, increase Depakote to 250mg  bid, continue Sertraline and Risperdal.

## 2016-09-14 NOTE — Assessment & Plan Note (Signed)
Will update TSH CBC Hgb a1c CMP Lipid panel Valproic acid Ammonia

## 2016-09-14 NOTE — Assessment & Plan Note (Signed)
Update TSH, last TSH 4.493 12/71/16

## 2016-09-14 NOTE — Assessment & Plan Note (Signed)
BLE, no open wounds presently.

## 2016-09-14 NOTE — Progress Notes (Signed)
Location:  [facility] Room Number: 24 Place of Service:  SNF (31) Provider:  Sylis Ketchum, Manxie  NP  Jeanmarie Hubert, MD  Patient Care Team: [name], MD as PCP - General (Internal Medicine) Verdene Lennert, DDS as Consulting Physician (Dentistry) [name], MD as Consulting Physician (Orthopedic Surgery) Dale Syriah Delisi Otho Darner, NP as Nurse Practitioner (Nurse Practitioner)  Extended Emergency Contact Information Primary Emergency Contact: [name] of [place] Phone: [phone] Relation: Legal Guardian  Code Status: DNR  Goals of care: Advanced Directive information Advanced Directives 09/14/2016  Does Patient Have a Medical Advance Directive? Yes  Type of Paramedic of [place];Living will;Out of facility DNR (pink MOST or yellow form)  Does patient want to make changes to medical advance directive? No - Patient declined  Copy of [place] in Chart? Yes  Pre-existing out of facility DNR order (yellow form or pink MOST form) -     Chief Complaint  Patient presents with  . Acute Visit    decrease in weight with increased  behaviors    HPI:  Pt is a 82 y.o. female seen today for an acute visit for weight loss, escalated irritability, refusal of care, striking out, hostile, inappropriate emotions, taking Risperdal 0.125mg , Depakote 125mg  bid, Sertraline 50mg .     Hx ofagitation, combative behaviors,  taking Depakote 125mg  bid, Risperdal 0.125mg  daily, Sertraline 50mg . The patient has chronic BLE edema, no apparent increased SOB, sputum production, or O2 desaturation. Taking Furosemide 20mg , Spironolactone 25mg  daily. She uses bid Senokot S for constipation.   Past Medical History:  Diagnosis Date  . Abnormality of gait   . AK (actinic keratosis)    right tip of nose and right posterior leg  . Anemia   . Arthritis   . Cancer ([place]) 08/07/12   squamous cell  ca,keratoacanthoma-right posterior leg  . Hemorrhoids   . Hypertension   . Hypoxemia   . Insomnia, unspecified   . Osteoarthrosis, unspecified whether generalized or localized, unspecified site   . Radiation 09/10/2012-10/24/2012   30 fractions to right posterior calf  . Radiation 09/10/12-10/24/12   Squamous cell right posterior calf 6000 cGy  . Skin cancer 06/2011   scc right elbow tx included excision  . Spastic colon   . Thyroid disease   . Traumatic closed nondisp torus fracture of distal radial metaphysis 12/08/13  . Unspecified hypothyroidism   . Unspecified urinary incontinence   . Varicose vein of leg    Past Surgical History:  Procedure Laterality Date  . ABDOMINAL HYSTERECTOMY     early 40's, abdominal  . anterior heel repair    . APPENDECTOMY    . FRACTURE SURGERY  2003   left   . pheochromocytoma     left  . TOTAL HIP ARTHROPLASTY  07/31/2010   right  . WRIST FRACTURE SURGERY  2003   left Dr. Newman Nip    No Known Allergies    Medication List       Accurate as of 09/14/16  2:38 PM. Always use your most recent med list.          acetaminophen 325 MG tablet Commonly known as:  TYLENOL Take 2 tablets (650 mg total) by mouth every 4 (four) hours as needed for mild pain (temperature >/= 99.5 F).   amoxicillin 500 MG capsule Commonly known as:  AMOXIL Take 2,000 mg by mouth. Before dental procedure .   aspirin 81 MG  tablet Take 1 tablet (81 mg total) by mouth daily.   divalproex 125 MG capsule Commonly known as:  DEPAKOTE SPRINKLES Take 125 mg twice daily to help calm aggressive behavior   feeding supplement Liqd Take 1 Container by mouth 3 (three) times daily between meals.   furosemide 20 MG tablet Commonly known as:  LASIX Take 20 mg by mouth daily.   lubriderm seriously sensitive Lotn Apply 1 application topically daily. For bilateral lower extremities   magnesium hydroxide 400 MG/5ML suspension Commonly known as:  MILK OF MAGNESIA Take  30 mLs by mouth daily as needed for mild constipation.   multivitamin tablet Take 1 tablet by mouth daily.   NUEDEXTA 20-10 MG Caps Generic drug:  Dextromethorphan-Quinidine Take 1 capsule by mouth at bedtime.   OXYGEN Inhale 2 L/min into the lungs as needed.   risperiDONE 0.5 MG tablet Commonly known as:  RISPERDAL 1 nightly to control delusions and agitated behavior   sennosides-docusate sodium 8.6-50 MG tablet Commonly known as:  SENOKOT-S Take 1 tablet by mouth 2 (two) times daily.   sertraline 50 MG tablet Commonly known as:  ZOLOFT One daily to help depression   spironolactone 25 MG tablet Commonly known as:  ALDACTONE Take 25 mg by mouth daily.       Review of Systems  Constitutional: Negative for appetite change, chills and diaphoresis.  HENT: Negative.   Eyes: Negative.   Respiratory: Negative for cough and chest tightness.   Cardiovascular: Negative for chest pain, palpitations and leg swelling.  Gastrointestinal: Positive for constipation. Negative for abdominal distention, abdominal pain, blood in stool, diarrhea, nausea and rectal pain.  Genitourinary: Negative.   Musculoskeletal: Positive for gait problem.       Using 4 wheel walker with seat. Painful left knee.  Skin:       Chronic changes of lower legs with darkening of the skin. Hx SCC of lower leg. Chronic itching.   Hematological: Negative.   Psychiatric/Behavioral: Positive for agitation, behavioral problems, confusion, decreased concentration and dysphoric mood. Negative for self-injury and suicidal ideas. The patient is nervous/anxious.     Immunization History  Administered Date(s) Administered  . Influenza Split 07/09/2013  . Influenza-Unspecified 07/13/2014, 07/14/2015, 07/20/2016  . PPD Test 11/17/2013, 03/10/2016  . Pneumococcal Polysaccharide-23 10/08/1998  . Td 10/08/2005  . Zoster 10/08/2005   Pertinent  Health Maintenance Due  Topic Date Due  . PNA vac Low Risk Adult (2 of 2 -  PCV13) 10/09/1999  . INFLUENZA VACCINE  Completed  . DEXA SCAN  Completed   Fall Risk  03/09/2016 03/08/2016 12/30/2012  Falls in the past year? Yes Yes No  Number falls in past yr: 2 or more 2 or more -  Injury with Fall? Yes No -  Risk Factor Category  High Fall Risk - -  Risk for fall due to : History of fall(s);Impaired balance/gait;Impaired mobility - -  Follow up Falls evaluation completed - -   Functional Status Survey:    Vitals:   09/14/16 1408  Pulse: 78  Resp: 16  SpO2: 97%  Weight: 103 lb 9.6 oz (47 kg)   Body mass index is 18.95 kg/m. Physical Exam  Constitutional: She is oriented to person, place, and time.  frail  Eyes: EOM are normal. Pupils are equal, round, and reactive to light. Right eye exhibits no discharge. Left eye exhibits no discharge. No scleral icterus.  Watery eyes  Neck: No JVD present. No tracheal deviation present. No thyromegaly present.  Cardiovascular:  Normal rate, regular rhythm, normal heart sounds and intact distal pulses.  Exam reveals no gallop and no friction rub.   No murmur heard. Pulmonary/Chest: No respiratory distress. She has no wheezes. She has rales. She exhibits no tenderness.  Rales in the chest bilaterally.  Abdominal: Bowel sounds are normal. She exhibits no distension and no mass. There is no tenderness. There is no rebound.  Musculoskeletal: Normal range of motion. She exhibits edema. She exhibits no tenderness.  Unstable gait. Using 4 wheel walker with brakes and seat.2+ edema BLE  Lymphadenopathy:    She has no cervical adenopathy.  Neurological: She is alert and oriented to person, place, and time. No cranial nerve deficit. Coordination normal.  Memory loss 10/06/14 MMSE 10/30  Skin: No rash noted. No erythema. No pallor.  Prior lesion over the left scapula removed by Dermatology.  Chronic venous stasis changes of both lower legs. Scaling of skin. 2 cm umbilicated erythematous lesion to right chin, suspicious for  squamous cell carcinoma    Psychiatric:  Confused, agitated. Difficult to redirect.    Labs reviewed:  Recent Labs  12/19/15  NA 139  K 4.8  BUN 21  CREATININE 0.6    Recent Labs  12/19/15  AST 26  ALT 6*  ALKPHOS 69   No results for input(s): WBC, NEUTROABS, HGB, HCT, MCV, PLT in the last 8760 hours. Lab Results  Component Value Date   TSH 4.49 09/14/2015   Lab Results  Component Value Date   HGBA1C 5.5 06/28/2016   Lab Results  Component Value Date   CHOL 147 03/22/2016   HDL 62 03/22/2016   LDLCALC 74 03/22/2016   TRIG 54 03/22/2016    Significant Diagnostic Results in last 30 days:  No results found.  Assessment/Plan Edema Minimal in BLE, continue Furosemide and Spironolactone. Update CMP  Loss of weight Will update TSH CBC Hgb a1c CMP Lipid panel Valproic acid Ammonia  PBA (pseudobulbar affect) Persisted emotional incontinent  Depression, major Persisted emotional outburst, continue Sertraline 50mg , Risperdal 0.125mg , increase Depakote to 250mg  bid   Generalized anxiety disorder Persisted behaviors, increase Depakote 250mg  bid, continue Sertraline and Risperdal.   Venous stasis dermatitis BLE, no open wounds presently.   Dementia Behavioral issues, continue SNF, increase Depakote to 250mg  bid, continue Sertraline and Risperdal.   Hypothyroidism Update TSH, last TSH 4.493 12/71/16     Family/ staff Communication: SNF  Labs: CBC CMP Hgb a1c, lipid, valproic acid, ammonia TSH

## 2016-09-14 NOTE — Assessment & Plan Note (Signed)
Persisted emotional incontinent

## 2016-09-17 DIAGNOSIS — E440 Moderate protein-calorie malnutrition: Secondary | ICD-10-CM | POA: Diagnosis not present

## 2016-09-17 DIAGNOSIS — R0902 Hypoxemia: Secondary | ICD-10-CM | POA: Diagnosis not present

## 2016-09-17 DIAGNOSIS — K589 Irritable bowel syndrome without diarrhea: Secondary | ICD-10-CM | POA: Diagnosis not present

## 2016-09-17 DIAGNOSIS — E0789 Other specified disorders of thyroid: Secondary | ICD-10-CM | POA: Diagnosis not present

## 2016-09-17 LAB — HEPATIC FUNCTION PANEL
ALT: 5 U/L — AB (ref 7–35)
AST: 15 U/L (ref 13–35)
Alkaline Phosphatase: 66 U/L (ref 25–125)
Bilirubin, Total: 0.5 mg/dL

## 2016-09-17 LAB — CBC AND DIFFERENTIAL
HEMATOCRIT: 40 % (ref 36–46)
Hemoglobin: 13 g/dL (ref 12.0–16.0)
PLATELETS: 204 10*3/uL (ref 150–399)
WBC: 5.2 10*3/mL

## 2016-09-17 LAB — TSH: TSH: 4.15 u[IU]/mL (ref <=5.90)

## 2016-09-17 LAB — BASIC METABOLIC PANEL
BUN: 21 mg/dL (ref 4–21)
Creatinine: 0.8 mg/dL (ref <=1.1)
Glucose: 79 mg/dL
Potassium: 4.8 mmol/L (ref 3.4–5.3)
Sodium: 140 mmol/L (ref 137–147)

## 2016-09-17 LAB — LIPID PANEL
CHOLESTEROL: 151 mg/dL (ref 0–200)
HDL: 50 mg/dL (ref 35–70)
LDL CALC: 81 mg/dL
TRIGLYCERIDES: 100 mg/dL (ref 40–160)

## 2016-09-17 LAB — HEMOGLOBIN A1C: Hemoglobin A1C: 5.7

## 2016-09-18 ENCOUNTER — Other Ambulatory Visit: Payer: Self-pay | Admitting: *Deleted

## 2016-10-03 NOTE — Telephone Encounter (Signed)
I do not think she needs the Nuedexta at this time.

## 2016-10-04 NOTE — Telephone Encounter (Signed)
Discussed with [facility] Headings that patient is not to take Nuedexta. Thayer Headings said they were already informed

## 2016-10-05 ENCOUNTER — Telehealth: Payer: Self-pay

## 2016-10-05 DIAGNOSIS — R0602 Shortness of breath: Secondary | ICD-10-CM | POA: Diagnosis not present

## 2016-10-05 DIAGNOSIS — R0902 Hypoxemia: Secondary | ICD-10-CM | POA: Diagnosis not present

## 2016-10-05 DIAGNOSIS — R05 Cough: Secondary | ICD-10-CM | POA: Diagnosis not present

## 2016-10-05 NOTE — Telephone Encounter (Signed)
Message left on clinical intake voicemail: Dr.Reed is covering [facility]. Please call to discuss request for Chest Xray order, patient with congestion, deep cough with greenish phlegm   I called Dorian at [facility] and informed him to have nurse call Dr.Reed on cell phone for future inquires

## 2016-10-05 NOTE — Telephone Encounter (Signed)
2 view chest xray was ordered thru [facility] (sp?) Pt was afebrile.

## 2016-10-09 ENCOUNTER — Non-Acute Institutional Stay (SKILLED_NURSING_FACILITY): Payer: Medicare Other | Admitting: Nurse Practitioner

## 2016-10-09 ENCOUNTER — Encounter: Payer: Self-pay | Admitting: Nurse Practitioner

## 2016-10-09 DIAGNOSIS — F0150 Vascular dementia without behavioral disturbance: Secondary | ICD-10-CM | POA: Diagnosis not present

## 2016-10-09 DIAGNOSIS — R609 Edema, unspecified: Secondary | ICD-10-CM | POA: Diagnosis not present

## 2016-10-09 DIAGNOSIS — I10 Essential (primary) hypertension: Secondary | ICD-10-CM

## 2016-10-09 DIAGNOSIS — J189 Pneumonia, unspecified organism: Secondary | ICD-10-CM | POA: Insufficient documentation

## 2016-10-09 DIAGNOSIS — E039 Hypothyroidism, unspecified: Secondary | ICD-10-CM

## 2016-10-09 DIAGNOSIS — K5900 Constipation, unspecified: Secondary | ICD-10-CM | POA: Diagnosis not present

## 2016-10-09 DIAGNOSIS — F411 Generalized anxiety disorder: Secondary | ICD-10-CM

## 2016-10-09 NOTE — Progress Notes (Signed)
Location:  [place] Room Number: 24 Place of Service:  SNF (31) Provider:  Kashten Gowin, Manxie  NP  Jeanmarie Hubert, MD  Patient Care Team: [name], MD as PCP - General (Internal Medicine) Verdene Lennert, DDS as Consulting Physician (Dentistry) [name], MD as Consulting Physician (Orthopedic Surgery) [place] Tmya [place] Otho Darner, NP as Nurse Practitioner (Nurse Practitioner)  Extended Emergency Contact Information Primary Emergency Contact: [name] of [place] Phone: [phone] Relation: Legal Guardian  Code Status:  DNR Goals of care: Advanced Directive information Advanced Directives 10/09/2016  Does Patient Have a Medical Advance Directive? Yes  Type of Paramedic of [place];Living will;Out of facility DNR (pink MOST or yellow form)  Does patient want to make changes to medical advance directive? No - Patient declined  Copy of [place] in Chart? Yes  Pre-existing out of facility DNR order (yellow form or pink MOST form) Yellow form placed in chart (order not valid for inpatient use)     Chief Complaint  Patient presents with  . Acute Visit    cold, green sputum    HPI:  Pt is a 83 y.o. female seen today for an acute visit for productive cough and malaise, CXR 10/05/16 PNA right lower lung, completed 5 day course of Avelox 400mg  daily, improved, but not healed. She is afebrile, no O2 desaturation today.   Hx ofagitation, combative behaviors,  better with Depakote,  Risperdal, and Sertraline 50mg . The patient has chronic BLE edema, no apparent increased SOB, sputum production, or O2 desaturation. Taking Furosemide 20mg , Spironolactone 25mg  daily. She uses bidSenokot S for constipation.   Past Medical History:  Diagnosis Date  . Abnormality of gait   . AK (actinic keratosis)    right tip of nose and right posterior leg  . Anemia   . Arthritis   . Cancer ([place])  08/07/12   squamous cell ca,keratoacanthoma-right posterior leg  . Hemorrhoids   . Hypertension   . Hypoxemia   . Insomnia, unspecified   . Osteoarthrosis, unspecified whether generalized or localized, unspecified site   . Radiation 09/10/2012-10/24/2012   30 fractions to right posterior calf  . Radiation 09/10/12-10/24/12   Squamous cell right posterior calf 6000 cGy  . Skin cancer 06/2011   scc right elbow tx included excision  . Spastic colon   . Thyroid disease   . Traumatic closed nondisp torus fracture of distal radial metaphysis 12/08/13  . Unspecified hypothyroidism   . Unspecified urinary incontinence   . Varicose vein of leg    Past Surgical History:  Procedure Laterality Date  . ABDOMINAL HYSTERECTOMY     early 40's, abdominal  . anterior heel repair    . APPENDECTOMY    . FRACTURE SURGERY  2003   left   . pheochromocytoma     left  . TOTAL HIP ARTHROPLASTY  07/31/2010   right  . WRIST FRACTURE SURGERY  2003   left Dr. Newman Nip    No Known Allergies  Allergies as of 10/09/2016   No Known Allergies     Medication List       Accurate as of 10/09/16  4:37 PM. Always use your most recent med list.          acetaminophen 325 MG tablet Commonly known as:  TYLENOL Take 2 tablets (650 mg total) by mouth every 4 (four) hours as needed for mild pain (temperature >/= 99.5 F).   amoxicillin  500 MG capsule Commonly known as:  AMOXIL Take 2,000 mg by mouth. Before dental procedure .   aspirin 81 MG tablet Take 1 tablet (81 mg total) by mouth daily.   bisacodyl 10 MG suppository Commonly known as:  DULCOLAX Place 10 mg rectally as needed for moderate constipation.   divalproex 125 MG capsule Commonly known as:  DEPAKOTE SPRINKLES Take 125 mg twice daily to help calm aggressive behavior   feeding supplement Liqd Take 1 Container by mouth 3 (three) times daily between meals.   furosemide 20 MG tablet Commonly known as:  LASIX Take 20 mg by mouth daily.    guaifenesin 100 MG/5ML syrup Commonly known as:  ROBITUSSIN Take 200 mg by mouth 3 (three) times daily as needed for cough.   lubriderm seriously sensitive Lotn Apply 1 application topically daily. For bilateral lower extremities   magnesium hydroxide 400 MG/5ML suspension Commonly known as:  MILK OF MAGNESIA Take 30 mLs by mouth daily as needed for mild constipation.   moxifloxacin 400 MG tablet Commonly known as:  AVELOX Take 400 mg by mouth daily at 8 pm.   multivitamin tablet Take 1 tablet by mouth daily.   OXYGEN Inhale 2 L/min into the lungs as needed.   risperiDONE 0.5 MG tablet Commonly known as:  RISPERDAL 1 nightly to control delusions and agitated behavior   sennosides-docusate sodium 8.6-50 MG tablet Commonly known as:  SENOKOT-S Take 1 tablet by mouth 2 (two) times daily.   sertraline 50 MG tablet Commonly known as:  ZOLOFT One daily to help depression   spironolactone 25 MG tablet Commonly known as:  ALDACTONE Take 25 mg by mouth daily.       Review of Systems  Constitutional: Negative for appetite change, chills and diaphoresis.  HENT: Negative.   Eyes: Negative.   Respiratory: Positive for cough. Negative for chest tightness.   Cardiovascular: Negative for chest pain, palpitations and leg swelling.  Gastrointestinal: Positive for constipation. Negative for abdominal distention, abdominal pain, blood in stool, diarrhea, nausea and rectal pain.  Genitourinary: Negative.   Musculoskeletal: Positive for gait problem.       Using 4 wheel walker with seat. Painful left knee.  Skin:       Chronic changes of lower legs with darkening of the skin. Hx SCC of lower leg. Chronic itching.   Hematological: Negative.   Psychiatric/Behavioral: Positive for agitation, behavioral problems, confusion, decreased concentration and dysphoric mood. Negative for self-injury and suicidal ideas. The patient is nervous/anxious.     Immunization History  Administered  Date(s) Administered  . Influenza Split 07/09/2013  . Influenza-Unspecified 07/13/2014, 07/14/2015, 07/20/2016  . PPD Test 11/17/2013, 03/10/2016  . Pneumococcal Polysaccharide-23 10/08/1998  . Td 10/08/2005  . Zoster 10/08/2005   Pertinent  Health Maintenance Due  Topic Date Due  . PNA vac Low Risk Adult (2 of 2 - PCV13) 10/09/1999  . INFLUENZA VACCINE  Completed  . DEXA SCAN  Completed   Fall Risk  03/09/2016 03/08/2016 12/30/2012  Falls in the past year? Yes Yes No  Number falls in past yr: 2 or more 2 or more -  Injury with Fall? Yes No -  Risk Factor Category  High Fall Risk - -  Risk for fall due to : History of fall(s);Impaired balance/gait;Impaired mobility - -  Follow up Falls evaluation completed - -   Functional Status Survey:    Vitals:   10/09/16 1504  BP: 110/80  Pulse: 86  Resp: 20  Temp: 97.5  F (36.4 C)  SpO2: 90%  Weight: 103 lb 4.8 oz (46.9 kg)  Height: 5\' 2"  (1.575 m)   Body mass index is 18.89 kg/m. Physical Exam  Constitutional: She is oriented to person, place, and time.  frail  Eyes: EOM are normal. Pupils are equal, round, and reactive to light. Right eye exhibits no discharge. Left eye exhibits no discharge. No scleral icterus.  Watery eyes  Neck: No JVD present. No tracheal deviation present. No thyromegaly present.  Cardiovascular: Normal rate, regular rhythm, normal heart sounds and intact distal pulses.  Exam reveals no gallop and no friction rub.   No murmur heard. Pulmonary/Chest: No respiratory distress. She has no wheezes. She has rales. She exhibits no tenderness.  Rales in the chest bilaterally.  Abdominal: Bowel sounds are normal. She exhibits no distension and no mass. There is no tenderness. There is no rebound.  Musculoskeletal: Normal range of motion. She exhibits edema. She exhibits no tenderness.  Unstable gait. Using 4 wheel walker with brakes and seat.2+ edema BLE  Lymphadenopathy:    She has no cervical adenopathy.    Neurological: She is alert and oriented to person, place, and time. No cranial nerve deficit. Coordination normal.  Memory loss 10/06/14 MMSE 10/30  Skin: No rash noted. No erythema. No pallor.  Prior lesion over the left scapula removed by Dermatology.  Chronic venous stasis changes of both lower legs. Scaling of skin. 2 cm umbilicated erythematous lesion to right chin, suspicious for squamous cell carcinoma    Psychiatric:  Confused, agitated. Difficult to redirect.    Labs reviewed:  Recent Labs  12/19/15 09/17/16  NA 139 140  K 4.8 4.8  BUN 21 21  CREATININE 0.6 0.8    Recent Labs  12/19/15 09/17/16  AST 26 15  ALT 6* 5*  ALKPHOS 69 66    Recent Labs  09/17/16  WBC 5.2  HGB 13.0  HCT 40  PLT 204   Lab Results  Component Value Date   TSH 4.15 09/17/2016   Lab Results  Component Value Date   HGBA1C 5.7 09/17/2016   Lab Results  Component Value Date   CHOL 151 09/17/2016   HDL 50 09/17/2016   LDLCALC 81 09/17/2016   TRIG 100 09/17/2016    Significant Diagnostic Results in last 30 days:  No results found.  Assessment/Plan Pneumonia involving right lung  productive cough and malaise, CXR 10/05/16 PNA right lower lung, completed 5 day course of Avelox 400mg  daily, improved, but not healed. She is afebrile, no O2 desaturation today.  Continue 5 more days of Avelox 400mg  daily. Observe.   Essential hypertension Controlled, continue Furosemide, Spironolactone   Constipation Stable, continue Senokot S  Hypothyroidism last TSH 4.493 12/71/16  Dementia Behavioral issues, continue SNF, continue Depakote, Sertraline,  and Risperdal  Generalized anxiety disorder Persisted behaviors, continue  Depakote,  Sertraline,  and Risperdal.   Edema Minimal in BLE, continue Furosemide and Spironolactone     Family/ staff Communication: SNF  Labs/tests ordered:  CXR done 10/05/16

## 2016-10-09 NOTE — Assessment & Plan Note (Signed)
Stable, continue Senokot S

## 2016-10-09 NOTE — Assessment & Plan Note (Signed)
Minimal in BLE, continue Furosemide and Spironolactone 

## 2016-10-09 NOTE — Assessment & Plan Note (Signed)
Controlled, continue Furosemide, Spironolactone 

## 2016-10-09 NOTE — Assessment & Plan Note (Addendum)
Behavioral issues, continue SNF, continue Depakote, Sertraline,  and Risperdal 

## 2016-10-09 NOTE — Assessment & Plan Note (Signed)
last TSH 4.493 12/71/16

## 2016-10-09 NOTE — Assessment & Plan Note (Signed)
Persisted behaviors, continue  Depakote,  Sertraline,  and Risperdal.

## 2016-10-09 NOTE — Assessment & Plan Note (Signed)
productive cough and malaise, CXR 10/05/16 PNA right lower lung, completed 5 day course of Avelox 400mg  daily, improved, but not healed. She is afebrile, no O2 desaturation today.  Continue 5 more days of Avelox 400mg  daily. Observe.

## 2016-10-10 DIAGNOSIS — J189 Pneumonia, unspecified organism: Secondary | ICD-10-CM | POA: Diagnosis not present

## 2016-10-10 DIAGNOSIS — R1312 Dysphagia, oropharyngeal phase: Secondary | ICD-10-CM | POA: Diagnosis not present

## 2016-11-01 ENCOUNTER — Encounter: Payer: Self-pay | Admitting: Internal Medicine

## 2016-11-01 ENCOUNTER — Non-Acute Institutional Stay (SKILLED_NURSING_FACILITY): Payer: Medicare Other | Admitting: Internal Medicine

## 2016-11-01 DIAGNOSIS — F0150 Vascular dementia without behavioral disturbance: Secondary | ICD-10-CM | POA: Diagnosis not present

## 2016-11-01 DIAGNOSIS — R1312 Dysphagia, oropharyngeal phase: Secondary | ICD-10-CM | POA: Diagnosis not present

## 2016-11-01 DIAGNOSIS — F639 Impulse disorder, unspecified: Secondary | ICD-10-CM | POA: Diagnosis not present

## 2016-11-01 DIAGNOSIS — N3000 Acute cystitis without hematuria: Secondary | ICD-10-CM

## 2016-11-01 DIAGNOSIS — J189 Pneumonia, unspecified organism: Secondary | ICD-10-CM | POA: Diagnosis not present

## 2016-11-01 NOTE — Progress Notes (Signed)
Progress Note    Location:  [facility] Room Number: [id number] Place of Service:  SNF [phone]) Provider:  Jeanmarie Hubert, MD  Patient Care Team: [name], MD as PCP - General (Internal Medicine) Verdene Lennert, DDS as Consulting Physician (Dentistry) [name], MD as Consulting Physician (Orthopedic Surgery) [place] Otho Darner, NP as Nurse Practitioner (Nurse Practitioner)  Extended Emergency Contact Information Primary Emergency Contact: [name] of [place] Phone: [phone] Relation: Legal Guardian  Code Status:  DNR Goals of care: Advanced Directive information Advanced Directives 11/01/2016  Does Patient Have a Medical Advance Directive? Yes  Type of Paramedic of [place];Living will;Out of facility DNR (pink MOST or yellow form)  Does patient want to make changes to medical advance directive? -  Copy of [place] in Chart? Yes  Pre-existing out of facility DNR order (yellow form or pink MOST form) Yellow form placed in chart (order not valid for inpatient use);Pink MOST form placed in chart (order not valid for inpatient use)     Chief Complaint  Patient presents with  . Medical Management of Chronic Issues    routine visit    HPI:  Pt is a 83 y.o. female seen today for medical management of chronic diseases.    She has had some increase in agitation recently. Urine culture showed E.coli infection. Started Septra DS per C&S.   Past Medical History:  Diagnosis Date  . Abnormality of gait   . AK (actinic keratosis)    right tip of nose and right posterior leg  . Anemia   . Arthritis   . Cancer ([place]) 08/07/12   squamous cell ca,keratoacanthoma-right posterior leg  . Hemorrhoids   . Hypertension   . Hypoxemia   . Insomnia, unspecified   . Osteoarthrosis, unspecified whether generalized or localized, unspecified site   . Radiation 09/10/2012-10/24/2012   30 fractions to right posterior calf  . Radiation 09/10/12-10/24/12   Squamous cell right posterior calf 6000 cGy  . Skin cancer 06/2011   scc right elbow tx included excision  . Spastic colon   . Thyroid disease   . Traumatic closed nondisp torus fracture of distal radial metaphysis 12/08/13  . Unspecified hypothyroidism   . Unspecified urinary incontinence   . Varicose vein of leg    Past Surgical History:  Procedure Laterality Date  . ABDOMINAL HYSTERECTOMY     early 40's, abdominal  . anterior heel repair    . APPENDECTOMY    . FRACTURE SURGERY  2003   left   . pheochromocytoma     left  . TOTAL HIP ARTHROPLASTY  07/31/2010   right  . WRIST FRACTURE SURGERY  2003   left Dr. Newman Nip    No Known Allergies  Allergies as of 11/01/2016   No Known Allergies     Medication List       Accurate as of 11/01/16  2:13 PM. Always use your most recent med list.          acetaminophen 325 MG tablet Commonly known as:  TYLENOL Take 2 tablets (650 mg total) by mouth every 4 (four) hours as needed for mild pain (temperature >/= 99.5 F).   amoxicillin 500 MG capsule Commonly known as:  AMOXIL Take 2,000 mg by mouth. Before dental procedure .   aspirin 81 MG tablet Take 1 tablet (81 mg total) by mouth daily.   bisacodyl 10 MG suppository Commonly  known as:  DULCOLAX Place 10 mg rectally as needed for moderate constipation.   divalproex 125 MG capsule Commonly known as:  DEPAKOTE SPRINKLES Take 125 mg twice daily to help calm aggressive behavior   feeding supplement Liqd Take 1 Container by mouth 3 (three) times daily between meals.   furosemide 20 MG tablet Commonly known as:  LASIX Take 20 mg by mouth daily.   magnesium hydroxide 400 MG/5ML suspension Commonly known as:  MILK OF MAGNESIA Take 30 mLs by mouth daily as needed for mild constipation.   multivitamin tablet Take 1 tablet by mouth daily.   OXYGEN Inhale 2 L/min into the lungs as needed.     risperiDONE 0.5 MG tablet Commonly known as:  RISPERDAL 1 nightly to control delusions and agitated behavior   sennosides-docusate sodium 8.6-50 MG tablet Commonly known as:  SENOKOT-S Take 1 tablet by mouth 2 (two) times daily.   sertraline 50 MG tablet Commonly known as:  ZOLOFT One daily to help depression   spironolactone 25 MG tablet Commonly known as:  ALDACTONE Take 25 mg by mouth daily.       Review of Systems  Constitutional: Negative for appetite change, chills and diaphoresis.  HENT: Negative.   Eyes: Negative.   Respiratory: Negative for cough and chest tightness.   Cardiovascular: Negative for chest pain, palpitations and leg swelling.  Gastrointestinal: Positive for constipation. Negative for abdominal distention, abdominal pain, blood in stool, diarrhea, nausea and rectal pain.  Genitourinary: Negative.   Musculoskeletal: Positive for gait problem.       Using 4 wheel walker with seat. Painful left knee.  Skin:       Chronic changes of lower legs with darkening of the skin. Hx SCC of lower leg. Chronic itching.  Hematological: Negative.   Psychiatric/Behavioral: Positive for agitation, behavioral problems, confusion, decreased concentration and dysphoric mood. Negative for self-injury and suicidal ideas. The patient is nervous/anxious.        Poor impulse control.    Immunization History  Administered Date(s) Administered  . Influenza Split 07/09/2013  . Influenza-Unspecified 07/13/2014, 07/14/2015, 07/20/2016  . PPD Test 11/17/2013, 03/10/2016  . Pneumococcal Polysaccharide-23 10/08/1998  . Td 10/08/2005  . Zoster 10/08/2005   Pertinent  Health Maintenance Due  Topic Date Due  . PNA vac Low Risk Adult (2 of 2 - PCV13) 10/09/1999  . INFLUENZA VACCINE  Completed  . DEXA SCAN  Completed   Fall Risk  03/09/2016 03/08/2016 12/30/2012  Falls in the past year? Yes Yes No  Number falls in past yr: 2 or more 2 or more -  Injury with Fall? Yes No -  Risk  Factor Category  High Fall Risk - -  Risk for fall due to : History of fall(s);Impaired balance/gait;Impaired mobility - -  Follow up Falls evaluation completed - -   Functional Status Survey:    Vitals:   11/01/16 1405  BP: 100/62  Pulse: 90  Resp: 14  Temp: 98.2 F (36.8 C)  SpO2: 90%  Weight: 101 lb 12.8 oz (46.2 kg)  Height: 5\' 2"  (1.575 m)   Body mass index is 18.62 kg/m. Physical Exam  Constitutional: She is oriented to person, place, and time.  frail  Eyes: EOM are normal. Pupils are equal, round, and reactive to light. Right eye exhibits no discharge. Left eye exhibits no discharge. No scleral icterus.  Watery eyes  Neck: No JVD present. No tracheal deviation present. No thyromegaly present.  Cardiovascular: Normal rate, regular rhythm, normal  heart sounds and intact distal pulses.  Exam reveals no gallop and no friction rub.   No murmur heard. Pulmonary/Chest: No respiratory distress. She has no wheezes. She has rales. She exhibits no tenderness.  Rales in the chest bilaterally.  Abdominal: Bowel sounds are normal. She exhibits no distension and no mass. There is no tenderness. There is no rebound.  Musculoskeletal: Normal range of motion. She exhibits edema. She exhibits no tenderness.  Unstable gait. Using 4 wheel walker with brakes and seat.2+ edema BLE  Lymphadenopathy:    She has no cervical adenopathy.  Neurological: She is alert and oriented to person, place, and time. No cranial nerve deficit. Coordination normal.  Memory loss 10/06/14 MMSE 10/30  Skin: No rash noted. No erythema. No pallor.  Prior lesion over the left scapula removed by Dermatology.  Chronic venous stasis changes of both lower legs. Scaling of skin. 2 cm umbilicated erythematous lesion to right chin, suspicious for squamous cell carcinoma   Psychiatric:  Confused, agitated. Difficult to redirect.    Labs reviewed:  Recent Labs  12/19/15 09/17/16  NA 139 140  K 4.8 4.8  BUN 21 21    CREATININE 0.6 0.8    Recent Labs  12/19/15 09/17/16  AST 26 15  ALT 6* 5*  ALKPHOS 69 66    Recent Labs  09/17/16  WBC 5.2  HGB 13.0  HCT 40  PLT 204   Lab Results  Component Value Date   TSH 4.15 09/17/2016   Lab Results  Component Value Date   HGBA1C 5.7 09/17/2016   Lab Results  Component Value Date   CHOL 151 09/17/2016   HDL 50 09/17/2016   LDLCALC 81 09/17/2016   TRIG 100 09/17/2016    10/30/16 Urine culture: E.coli  Assessment/Plan  1. Vascular dementia without behavioral disturbance Worse agitatio recently. May be induced by UTI diagnosed today.  2. Impulse control disease Aggressive to staff and other residents  3. Acute cystitis without hematuria -SeptraDS bid x 7 d

## 2016-11-02 ENCOUNTER — Encounter: Payer: Self-pay | Admitting: Nurse Practitioner

## 2016-11-02 NOTE — Progress Notes (Signed)
This encounter was created in error - please disregard.

## 2016-11-05 DIAGNOSIS — R1312 Dysphagia, oropharyngeal phase: Secondary | ICD-10-CM | POA: Diagnosis not present

## 2016-11-05 DIAGNOSIS — J189 Pneumonia, unspecified organism: Secondary | ICD-10-CM | POA: Diagnosis not present

## 2016-11-07 DIAGNOSIS — R1312 Dysphagia, oropharyngeal phase: Secondary | ICD-10-CM | POA: Diagnosis not present

## 2016-11-07 DIAGNOSIS — J189 Pneumonia, unspecified organism: Secondary | ICD-10-CM | POA: Diagnosis not present

## 2016-11-09 DIAGNOSIS — R1312 Dysphagia, oropharyngeal phase: Secondary | ICD-10-CM | POA: Diagnosis not present

## 2016-11-09 DIAGNOSIS — J189 Pneumonia, unspecified organism: Secondary | ICD-10-CM | POA: Diagnosis not present

## 2016-11-13 DIAGNOSIS — J189 Pneumonia, unspecified organism: Secondary | ICD-10-CM | POA: Diagnosis not present

## 2016-11-13 DIAGNOSIS — R1312 Dysphagia, oropharyngeal phase: Secondary | ICD-10-CM | POA: Diagnosis not present

## 2016-11-16 DIAGNOSIS — R1312 Dysphagia, oropharyngeal phase: Secondary | ICD-10-CM | POA: Diagnosis not present

## 2016-11-16 DIAGNOSIS — J189 Pneumonia, unspecified organism: Secondary | ICD-10-CM | POA: Diagnosis not present

## 2016-11-19 DIAGNOSIS — R1312 Dysphagia, oropharyngeal phase: Secondary | ICD-10-CM | POA: Diagnosis not present

## 2016-11-19 DIAGNOSIS — J189 Pneumonia, unspecified organism: Secondary | ICD-10-CM | POA: Diagnosis not present

## 2016-11-21 DIAGNOSIS — R1312 Dysphagia, oropharyngeal phase: Secondary | ICD-10-CM | POA: Diagnosis not present

## 2016-11-21 DIAGNOSIS — J189 Pneumonia, unspecified organism: Secondary | ICD-10-CM | POA: Diagnosis not present

## 2016-11-23 DIAGNOSIS — J189 Pneumonia, unspecified organism: Secondary | ICD-10-CM | POA: Diagnosis not present

## 2016-11-23 DIAGNOSIS — R1312 Dysphagia, oropharyngeal phase: Secondary | ICD-10-CM | POA: Diagnosis not present

## 2016-12-04 ENCOUNTER — Non-Acute Institutional Stay (SKILLED_NURSING_FACILITY): Payer: Medicare Other | Admitting: Nurse Practitioner

## 2016-12-04 ENCOUNTER — Encounter: Payer: Self-pay | Admitting: Nurse Practitioner

## 2016-12-04 DIAGNOSIS — I10 Essential (primary) hypertension: Secondary | ICD-10-CM

## 2016-12-04 DIAGNOSIS — E039 Hypothyroidism, unspecified: Secondary | ICD-10-CM

## 2016-12-04 DIAGNOSIS — F639 Impulse disorder, unspecified: Secondary | ICD-10-CM

## 2016-12-04 DIAGNOSIS — F0150 Vascular dementia without behavioral disturbance: Secondary | ICD-10-CM

## 2016-12-04 DIAGNOSIS — K5900 Constipation, unspecified: Secondary | ICD-10-CM

## 2016-12-04 NOTE — Assessment & Plan Note (Signed)
Behavioral issues, continue SNF, continue Depakote, Sertraline,  and Risperdal 

## 2016-12-04 NOTE — Assessment & Plan Note (Signed)
last TSH 4.493 12/71/16

## 2016-12-04 NOTE — Progress Notes (Signed)
Location:  [facility] Room Number: 24 Place of Service:  SNF (31) Provider:  Mast, Manxie  NP  Jeanmarie Hubert, MD  Patient Care Team: [name], MD as PCP - General (Internal Medicine) Verdene Lennert, DDS as Consulting Physician (Dentistry) [name], MD as Consulting Physician (Orthopedic Surgery) [place] Man Otho Darner, NP as Nurse Practitioner (Nurse Practitioner)  Extended Emergency Contact Information Primary Emergency Contact: [name] of [place] Phone: [phone] Relation: Legal Guardian  Code Status:  DNR Goals of care: Advanced Directive information Advanced Directives 12/04/2016  Does Patient Have a Medical Advance Directive? Yes  Type of Paramedic of [place];Living will;Out of facility DNR (pink MOST or yellow form)  Does patient want to make changes to medical advance directive? No - Patient declined  Copy of [place] in Chart? Yes  Pre-existing out of facility DNR order (yellow form or pink MOST form) Yellow form placed in chart (order not valid for inpatient use)     Chief Complaint  Patient presents with  . Medical Management of Chronic Issues    HPI:  Pt is a 83 y.o. female seen today for medical management of chronic diseases.    Hx ofagitation, combative behaviors, better with Depakote,  Risperdal, and Sertraline 50mg . The patient has chronic BLE edema, no apparent increased SOB, sputum production, or O2 desaturation. Taking Furosemide 20mg , Spironolactone 25mg  daily. She uses bidSenokot S for constipation.   Past Medical History:  Diagnosis Date  . Abnormality of gait   . AK (actinic keratosis)    right tip of nose and right posterior leg  . Anemia   . Arthritis   . Cancer ([place]) 08/07/12   squamous cell ca,keratoacanthoma-right posterior leg  . Hemorrhoids   . Hypertension   . Hypoxemia   . Insomnia, unspecified   . Osteoarthrosis,  unspecified whether generalized or localized, unspecified site   . Radiation 09/10/2012-10/24/2012   30 fractions to right posterior calf  . Radiation 09/10/12-10/24/12   Squamous cell right posterior calf 6000 cGy  . Skin cancer 06/2011   scc right elbow tx included excision  . Spastic colon   . Thyroid disease   . Traumatic closed nondisp torus fracture of distal radial metaphysis 12/08/13  . Unspecified hypothyroidism   . Unspecified urinary incontinence   . Varicose vein of leg    Past Surgical History:  Procedure Laterality Date  . ABDOMINAL HYSTERECTOMY     early 40's, abdominal  . anterior heel repair    . APPENDECTOMY    . FRACTURE SURGERY  2003   left   . pheochromocytoma     left  . TOTAL HIP ARTHROPLASTY  07/31/2010   right  . WRIST FRACTURE SURGERY  2003   left Dr. Newman Nip    No Known Allergies  Allergies as of 12/04/2016   No Known Allergies     Medication List       Accurate as of 12/04/16  1:28 PM. Always use your most recent med list.          acetaminophen 325 MG tablet Commonly known as:  TYLENOL Take 2 tablets (650 mg total) by mouth every 4 (four) hours as needed for mild pain (temperature >/= 99.5 F).   amoxicillin 500 MG capsule Commonly known as:  AMOXIL Take 2,000 mg by mouth. Before dental procedure .   aspirin 81 MG tablet Take 1 tablet (81 mg total) by mouth  daily.   bisacodyl 10 MG suppository Commonly known as:  DULCOLAX Place 10 mg rectally as needed for moderate constipation.   divalproex 125 MG capsule Commonly known as:  DEPAKOTE SPRINKLES Take 125 mg twice daily to help calm aggressive behavior   feeding supplement Liqd Take 1 Container by mouth 3 (three) times daily between meals.   furosemide 20 MG tablet Commonly known as:  LASIX Take 20 mg by mouth daily.   magnesium hydroxide 400 MG/5ML suspension Commonly known as:  MILK OF MAGNESIA Take 30 mLs by mouth daily as needed for mild constipation.   multivitamin  tablet Take 1 tablet by mouth daily.   OXYGEN Inhale 2 L/min into the lungs as needed.   risperiDONE 0.5 MG tablet Commonly known as:  RISPERDAL 1 nightly to control delusions and agitated behavior   sennosides-docusate sodium 8.6-50 MG tablet Commonly known as:  SENOKOT-S Take 1 tablet by mouth 2 (two) times daily.   sertraline 50 MG tablet Commonly known as:  ZOLOFT One daily to help depression   spironolactone 25 MG tablet Commonly known as:  ALDACTONE Take 25 mg by mouth daily.       Review of Systems  Constitutional: Negative for appetite change, chills and diaphoresis.  HENT: Negative.   Eyes: Negative.   Respiratory: Negative for cough and chest tightness.   Cardiovascular: Negative for chest pain, palpitations and leg swelling.  Gastrointestinal: Positive for constipation. Negative for abdominal distention, abdominal pain, blood in stool, diarrhea, nausea and rectal pain.  Genitourinary: Negative.   Musculoskeletal: Positive for gait problem.       Using 4 wheel walker with seat. Painful left knee.  Skin:       Chronic changes of lower legs with darkening of the skin. Hx SCC of lower leg. Chronic itching.  Hematological: Negative.   Psychiatric/Behavioral: Positive for agitation, behavioral problems, confusion, decreased concentration and dysphoric mood. Negative for self-injury and suicidal ideas. The patient is nervous/anxious.        Poor impulse control.    Immunization History  Administered Date(s) Administered  . Influenza Split 07/09/2013  . Influenza-Unspecified 07/13/2014, 07/14/2015, 07/20/2016  . PPD Test 11/17/2013, 03/10/2016  . Pneumococcal Polysaccharide-23 10/08/1998  . Td 10/08/2005  . Zoster 10/08/2005   Pertinent  Health Maintenance Due  Topic Date Due  . PNA vac Low Risk Adult (2 of 2 - PCV13) 10/09/1999  . INFLUENZA VACCINE  Completed  . DEXA SCAN  Completed   Fall Risk  03/09/2016 03/08/2016 12/30/2012  Falls in the past year? Yes  Yes No  Number falls in past yr: 2 or more 2 or more -  Injury with Fall? Yes No -  Risk Factor Category  High Fall Risk - -  Risk for fall due to : History of fall(s);Impaired balance/gait;Impaired mobility - -  Follow up Falls evaluation completed - -   Functional Status Survey:    Vitals:   12/04/16 1254  BP: 119/70  Pulse: 79  Resp: 13  Temp: 97.9 F (36.6 C)  SpO2: 91%  Weight: 102 lb 1.6 oz (46.3 kg)  Height: 5\' 2"  (1.575 m)   Body mass index is 18.67 kg/m. Physical Exam  Constitutional: She is oriented to person, place, and time.  frail  Eyes: EOM are normal. Pupils are equal, round, and reactive to light. Right eye exhibits no discharge. Left eye exhibits no discharge. No scleral icterus.  Watery eyes  Neck: No JVD present. No tracheal deviation present. No thyromegaly present.  Cardiovascular: Normal rate, regular rhythm, normal heart sounds and intact distal pulses.  Exam reveals no gallop and no friction rub.   No murmur heard. Pulmonary/Chest: No respiratory distress. She has no wheezes. She has rales. She exhibits no tenderness.  Rales in the chest bilaterally.  Abdominal: Bowel sounds are normal. She exhibits no distension and no mass. There is no tenderness. There is no rebound.  Musculoskeletal: Normal range of motion. She exhibits edema. She exhibits no tenderness.  Unstable gait. Using 4 wheel walker with brakes and seat.2+ edema BLE  Lymphadenopathy:    She has no cervical adenopathy.  Neurological: She is alert and oriented to person, place, and time. No cranial nerve deficit. Coordination normal.  Memory loss 10/06/14 MMSE 10/30  Skin: No rash noted. No erythema. No pallor.  Prior lesion over the left scapula removed by Dermatology.  Chronic venous stasis changes of both lower legs. Scaling of skin. 2 cm umbilicated erythematous lesion to right chin, suspicious for squamous cell carcinoma   Psychiatric:  Confused, agitated. Difficult to redirect.     Labs reviewed:  Recent Labs  12/19/15 09/17/16  NA 139 140  K 4.8 4.8  BUN 21 21  CREATININE 0.6 0.8    Recent Labs  12/19/15 09/17/16  AST 26 15  ALT 6* 5*  ALKPHOS 69 66    Recent Labs  09/17/16  WBC 5.2  HGB 13.0  HCT 40  PLT 204   Lab Results  Component Value Date   TSH 4.15 09/17/2016   Lab Results  Component Value Date   HGBA1C 5.7 09/17/2016   Lab Results  Component Value Date   CHOL 151 09/17/2016   HDL 50 09/17/2016   LDLCALC 81 09/17/2016   TRIG 100 09/17/2016    Significant Diagnostic Results in last 30 days:  No results found.  Assessment/Plan Essential hypertension Controlled, continue Furosemide, Spironolactone  Constipation Stable, continue Senokot S I bid, prn MOM and Bisacodyl suppository.    Hypothyroidism last TSH 4.493 12/71/16   Dementia Behavioral issues, continue SNF, continue Depakote, Sertraline,  and Risperdal  Impulse control disease Managed, continue Risperdal, Depakote, Sertraline.      Family/ staff Communication: SNF  Labs/tests ordered:  none

## 2016-12-04 NOTE — Assessment & Plan Note (Signed)
Managed, continue Risperdal, Depakote, Sertraline.

## 2016-12-04 NOTE — Assessment & Plan Note (Signed)
Stable, continue Senokot S I bid, prn MOM and Bisacodyl suppository.  

## 2016-12-04 NOTE — Assessment & Plan Note (Signed)
Controlled, continue Furosemide, Spironolactone 

## 2017-01-01 ENCOUNTER — Encounter: Payer: Self-pay | Admitting: Nurse Practitioner

## 2017-01-01 ENCOUNTER — Non-Acute Institutional Stay (SKILLED_NURSING_FACILITY): Payer: Medicare Other | Admitting: Nurse Practitioner

## 2017-01-01 DIAGNOSIS — R609 Edema, unspecified: Secondary | ICD-10-CM

## 2017-01-01 DIAGNOSIS — E039 Hypothyroidism, unspecified: Secondary | ICD-10-CM | POA: Diagnosis not present

## 2017-01-01 DIAGNOSIS — F411 Generalized anxiety disorder: Secondary | ICD-10-CM

## 2017-01-01 DIAGNOSIS — K5900 Constipation, unspecified: Secondary | ICD-10-CM

## 2017-01-01 DIAGNOSIS — F0150 Vascular dementia without behavioral disturbance: Secondary | ICD-10-CM | POA: Diagnosis not present

## 2017-01-01 DIAGNOSIS — I10 Essential (primary) hypertension: Secondary | ICD-10-CM | POA: Diagnosis not present

## 2017-01-01 DIAGNOSIS — F639 Impulse disorder, unspecified: Secondary | ICD-10-CM

## 2017-01-01 NOTE — Assessment & Plan Note (Signed)
Controlled, continue Furosemide, Spironolactone

## 2017-01-01 NOTE — Assessment & Plan Note (Signed)
last TSH 4.15 09/17/16

## 2017-01-01 NOTE — Assessment & Plan Note (Signed)
12/07/16 CP desires to increase Risperdal to 0.5mg  qd

## 2017-01-01 NOTE — Assessment & Plan Note (Signed)
Stable, continue Senokot S I bid, prn MOM and Bisacodyl suppository.

## 2017-01-01 NOTE — Assessment & Plan Note (Signed)
Persisted behaviors, continue  Depakote 125mg  bid,  Sertraline 50mg  qd,  and Risperdal 0.5mg  qd

## 2017-01-01 NOTE — Assessment & Plan Note (Signed)
Behavioral issues, continue SNF, continue Depakote, Sertraline,  and Risperdal

## 2017-01-01 NOTE — Assessment & Plan Note (Signed)
Minimal in BLE, continue Furosemide and Spironolactone

## 2017-01-01 NOTE — Progress Notes (Signed)
Location:  [facility] Room Number: 24 Place of Service:  SNF (31) Provider:  Josee Speece, Manxie  NP  Jeanmarie Hubert, MD  Patient Care Team: [name], MD as PCP - General (Internal Medicine) Verdene Lennert, DDS as Consulting Physician (Dentistry) [name], MD as Consulting Physician (Orthopedic Surgery) [place] Benjimen Kelley Otho Darner, NP as Nurse Practitioner (Nurse Practitioner)  Extended Emergency Contact Information Primary Emergency Contact: [name] of [place] Phone: [phone] Relation: Legal Guardian  Code Status: DNR Goals of care: Advanced Directive information Advanced Directives 01/01/2017  Does Patient Have a Medical Advance Directive? Yes  Type of Paramedic of [place];Living will;Out of facility DNR (pink MOST or yellow form)  Does patient want to make changes to medical advance directive? No - Patient declined  Copy of [place] in Chart? Yes  Pre-existing out of facility DNR order (yellow form or pink MOST form) Yellow form placed in chart (order not valid for inpatient use)     Chief Complaint  Patient presents with  . Medical Management of Chronic Issues    HPI:  Pt is a 83 y.o. female seen today for medical management of chronic diseases.      Hx ofagitation, combative behaviors, better with Depakote 125mg  bid, Risperdal 0.5mg  qd and Sertraline 50mg . The patient has chronic BLE edema, no apparent increased SOB, sputum production, or O2 desaturation. Taking Furosemide 20mg , Spironolactone 25mg  daily. She uses bidSenokot S, prn MOM, prn DulcoLax  for constipation.  Past Medical History:  Diagnosis Date  . Abnormality of gait   . AK (actinic keratosis)    right tip of nose and right posterior leg  . Anemia   . Arthritis   . Cancer ([place]) 08/07/12   squamous cell ca,keratoacanthoma-right posterior leg  . Hemorrhoids   . Hypertension   . Hypoxemia   .  Insomnia, unspecified   . Osteoarthrosis, unspecified whether generalized or localized, unspecified site   . Radiation 09/10/2012-10/24/2012   30 fractions to right posterior calf  . Radiation 09/10/12-10/24/12   Squamous cell right posterior calf 6000 cGy  . Skin cancer 06/2011   scc right elbow tx included excision  . Spastic colon   . Thyroid disease   . Traumatic closed nondisp torus fracture of distal radial metaphysis 12/08/13  . Unspecified hypothyroidism   . Unspecified urinary incontinence   . Varicose vein of leg    Past Surgical History:  Procedure Laterality Date  . ABDOMINAL HYSTERECTOMY     early 40's, abdominal  . anterior heel repair    . APPENDECTOMY    . FRACTURE SURGERY  2003   left   . pheochromocytoma     left  . TOTAL HIP ARTHROPLASTY  07/31/2010   right  . WRIST FRACTURE SURGERY  2003   left Dr. Newman Nip    No Known Allergies  Allergies as of 01/01/2017   No Known Allergies     Medication List       Accurate as of 01/01/17  1:37 PM. Always use your most recent med list.          acetaminophen 325 MG tablet Commonly known as:  TYLENOL Take 2 tablets (650 mg total) by mouth every 4 (four) hours as needed for mild pain (temperature >/= 99.5 F).   amoxicillin 500 MG capsule Commonly known as:  AMOXIL Take 2,000 mg by mouth. Before dental procedure .   aspirin 81 MG tablet  Take 1 tablet (81 mg total) by mouth daily.   bisacodyl 10 MG suppository Commonly known as:  DULCOLAX Place 10 mg rectally as needed for moderate constipation.   divalproex 125 MG capsule Commonly known as:  DEPAKOTE SPRINKLES Take 125 mg twice daily to help calm aggressive behavior   feeding supplement Liqd Take 1 Container by mouth 3 (three) times daily between meals.   furosemide 20 MG tablet Commonly known as:  LASIX Take 20 mg by mouth daily.   magnesium hydroxide 400 MG/5ML suspension Commonly known as:  MILK OF MAGNESIA Take 30 mLs by mouth daily as  needed for mild constipation.   multivitamin tablet Take 1 tablet by mouth daily.   OXYGEN Inhale 2 L/min into the lungs as needed.   risperiDONE 0.5 MG tablet Commonly known as:  RISPERDAL 1 nightly to control delusions and agitated behavior   sennosides-docusate sodium 8.6-50 MG tablet Commonly known as:  SENOKOT-S Take 1 tablet by mouth 2 (two) times daily.   sertraline 50 MG tablet Commonly known as:  ZOLOFT One daily to help depression   spironolactone 25 MG tablet Commonly known as:  ALDACTONE Take 25 mg by mouth daily.       Review of Systems  Constitutional: Negative for appetite change, chills and diaphoresis.  HENT: Negative.   Eyes: Negative.   Respiratory: Negative for cough and chest tightness.   Cardiovascular: Negative for chest pain, palpitations and leg swelling.  Gastrointestinal: Positive for constipation. Negative for abdominal distention, abdominal pain, blood in stool, diarrhea, nausea and rectal pain.  Genitourinary: Negative.   Musculoskeletal: Positive for gait problem.       Using 4 wheel walker with seat. Painful left knee.  Skin:       Chronic changes of lower legs with darkening of the skin. Hx SCC of lower leg. Chronic itching.  Hematological: Negative.   Psychiatric/Behavioral: Positive for agitation, behavioral problems, confusion, decreased concentration and dysphoric mood. Negative for self-injury and suicidal ideas. The patient is nervous/anxious.        Poor impulse control.    Immunization History  Administered Date(s) Administered  . Influenza Split 07/09/2013  . Influenza-Unspecified 07/13/2014, 07/14/2015, 07/20/2016  . PPD Test 11/17/2013, 03/10/2016  . Pneumococcal Polysaccharide-23 10/08/1998  . Td 10/08/2005  . Zoster 10/08/2005   Pertinent  Health Maintenance Due  Topic Date Due  . PNA vac Low Risk Adult (2 of 2 - PCV13) 10/09/1999  . INFLUENZA VACCINE  Completed  . DEXA SCAN  Completed   Fall Risk  03/09/2016  03/08/2016 12/30/2012  Falls in the past year? Yes Yes No  Number falls in past yr: 2 or more 2 or more -  Injury with Fall? Yes No -  Risk Factor Category  High Fall Risk - -  Risk for fall due to : History of fall(s);Impaired balance/gait;Impaired mobility - -  Follow up Falls evaluation completed - -   Functional Status Survey:    Vitals:   01/01/17 1221  BP: 120/75  Pulse: 93  Resp: 15  Temp: 97.6 F (36.4 C)  SpO2: 90%  Weight: 101 lb 4.8 oz (45.9 kg)  Height: 5\' 2"  (1.575 m)   Body mass index is 18.53 kg/m. Physical Exam  Constitutional: She is oriented to person, place, and time.  frail  Eyes: EOM are normal. Pupils are equal, round, and reactive to light. Right eye exhibits no discharge. Left eye exhibits no discharge. No scleral icterus.  Watery eyes  Neck: No JVD  present. No tracheal deviation present. No thyromegaly present.  Cardiovascular: Normal rate, regular rhythm, normal heart sounds and intact distal pulses.  Exam reveals no gallop and no friction rub.   No murmur heard. Pulmonary/Chest: No respiratory distress. She has no wheezes. She has rales. She exhibits no tenderness.  Rales in the chest bilaterally.  Abdominal: Bowel sounds are normal. She exhibits no distension and no mass. There is no tenderness. There is no rebound.  Musculoskeletal: Normal range of motion. She exhibits edema. She exhibits no tenderness.  Unstable gait. Using 4 wheel walker with brakes and seat.2+ edema BLE  Lymphadenopathy:    She has no cervical adenopathy.  Neurological: She is alert and oriented to person, place, and time. No cranial nerve deficit. Coordination normal.  Memory loss 10/06/14 MMSE 10/30  Skin: No rash noted. No erythema. No pallor.  Prior lesion over the left scapula removed by Dermatology.  Chronic venous stasis changes of both lower legs. Scaling of skin. 2 cm umbilicated erythematous lesion to right chin, suspicious for squamous cell carcinoma   Psychiatric:   Confused, agitated. Difficult to redirect.    Labs reviewed:  Recent Labs  09/17/16  NA 140  K 4.8  BUN 21  CREATININE 0.8    Recent Labs  09/17/16  AST 15  ALT 5*  ALKPHOS 66    Recent Labs  09/17/16  WBC 5.2  HGB 13.0  HCT 40  PLT 204   Lab Results  Component Value Date   TSH 4.15 09/17/2016   Lab Results  Component Value Date   HGBA1C 5.7 09/17/2016   Lab Results  Component Value Date   CHOL 151 09/17/2016   HDL 50 09/17/2016   LDLCALC 81 09/17/2016   TRIG 100 09/17/2016    Significant Diagnostic Results in last 30 days:  No results found.  Assessment/Plan Essential hypertension Controlled, continue Furosemide, Spironolactone  Constipation Stable, continue Senokot S I bid, prn MOM and Bisacodyl suppository.   Hypothyroidism last TSH 4.15 09/17/16  Dementia Behavioral issues, continue SNF, continue Depakote, Sertraline,  and Risperdal  Generalized anxiety disorder Persisted behaviors, continue  Depakote 125mg  bid,  Sertraline 50mg  qd,  and Risperdal 0.5mg  qd  Impulse control disease 12/07/16 CP desires to increase Risperdal to 0.5mg  qd  Edema Minimal in BLE, continue Furosemide and Spironolactone    Family/ staff Communication: SNF  Labs/tests ordered:  none

## 2017-01-07 DIAGNOSIS — M25551 Pain in right hip: Secondary | ICD-10-CM | POA: Diagnosis not present

## 2017-01-07 DIAGNOSIS — M25561 Pain in right knee: Secondary | ICD-10-CM | POA: Diagnosis not present

## 2017-01-11 ENCOUNTER — Non-Acute Institutional Stay (SKILLED_NURSING_FACILITY): Payer: Medicare Other | Admitting: Nurse Practitioner

## 2017-01-11 ENCOUNTER — Encounter: Payer: Self-pay | Admitting: Nurse Practitioner

## 2017-01-11 DIAGNOSIS — E039 Hypothyroidism, unspecified: Secondary | ICD-10-CM

## 2017-01-11 DIAGNOSIS — F639 Impulse disorder, unspecified: Secondary | ICD-10-CM

## 2017-01-11 DIAGNOSIS — I10 Essential (primary) hypertension: Secondary | ICD-10-CM

## 2017-01-11 DIAGNOSIS — F0150 Vascular dementia without behavioral disturbance: Secondary | ICD-10-CM

## 2017-01-11 DIAGNOSIS — R609 Edema, unspecified: Secondary | ICD-10-CM | POA: Diagnosis not present

## 2017-01-11 DIAGNOSIS — N3000 Acute cystitis without hematuria: Secondary | ICD-10-CM | POA: Diagnosis not present

## 2017-01-11 DIAGNOSIS — F320 Major depressive disorder, single episode, mild: Secondary | ICD-10-CM

## 2017-01-11 DIAGNOSIS — K5900 Constipation, unspecified: Secondary | ICD-10-CM | POA: Diagnosis not present

## 2017-01-11 DIAGNOSIS — F411 Generalized anxiety disorder: Secondary | ICD-10-CM

## 2017-01-11 NOTE — Assessment & Plan Note (Addendum)
pacing,  agitation, combative behaviors, unable to obtain UA C/S or labs, Lorazepam 1mg  x1, too soon to eval, Hx of UTI with similar presentation, will start 7 day course of Septra DS, will obtain labs when able. Observe the patient for now.  CBC CMP UA C/S TSH Hgb a1c 12/07/16 CP desires to increase Risperdal to 0.5mg  qd Hx of throwing things. Shoving walker at staff. Taking Depakote, Risperdal, Sertraline. 06/26/16 pharm reduce Risperdal 0.25mg  qd 12/07/16 CP desires to increase Risperdal to 0.5mg  qd

## 2017-01-11 NOTE — Progress Notes (Signed)
Location:  [facility] Room Number: 24 Place of Service:  SNF (31) Provider: Vonna Brabson, [place]  NP  Jeanmarie Hubert, MD  Patient Care Team: [name], MD as PCP - General (Internal Medicine) Verdene Lennert, DDS as Consulting Physician (Dentistry) [name], MD as Consulting Physician (Orthopedic Surgery) [place] Kiandra Sanguinetti Otho Darner, NP as Nurse Practitioner (Nurse Practitioner)  Extended Emergency Contact Information Primary Emergency Contact: [name] of [place] Phone: [phone] Relation: Legal Guardian  Code Status: DNR Goals of care: Advanced Directive information Advanced Directives 01/11/2017  Does Patient Have a Medical Advance Directive? Yes  Type of Paramedic of [place];Living will;Out of facility DNR (pink MOST or yellow form)  Does patient want to make changes to medical advance directive? No - Patient declined  Copy of [place] in Chart? Yes  Pre-existing out of facility DNR order (yellow form or pink MOST form) Yellow form placed in chart (order not valid for inpatient use)     Chief Complaint  Patient presents with  . Acute Visit    behaviors daily    HPI:  Pt is a 83 y.o. female seen today for an acute visit for pacing,  agitation, combative behaviors, unable to obtain UA C/S or labs, Lorazepam 1mg  x1, too soon to eval, Hx of UTI with similar presentation, will start 7 day course of Septra DS, will obtain labs when able. Observe the patient for now.   Hx ofagitation, combative behaviors, relapsed, taking Depakote 125mg  bid, Risperdal 0.5mg  qd and Sertraline 50mg . The patient has chronic BLE edema, no apparent increased SOB, sputum production, or O2 desaturation. Taking Furosemide 20mg , Spironolactone 25mg  daily. She uses bidSenokot S, prn MOM, prn DulcoLax  for constipation.   Past Medical History:  Diagnosis Date  . Abnormality of gait   . AK (actinic  keratosis)    right tip of nose and right posterior leg  . Anemia   . Arthritis   . Cancer ([place]) 08/07/12   squamous cell ca,keratoacanthoma-right posterior leg  . Hemorrhoids   . Hypertension   . Hypoxemia   . Insomnia, unspecified   . Osteoarthrosis, unspecified whether generalized or localized, unspecified site   . Radiation 09/10/2012-10/24/2012   30 fractions to right posterior calf  . Radiation 09/10/12-10/24/12   Squamous cell right posterior calf 6000 cGy  . Skin cancer 06/2011   scc right elbow tx included excision  . Spastic colon   . Thyroid disease   . Traumatic closed nondisp torus fracture of distal radial metaphysis 12/08/13  . Unspecified hypothyroidism   . Unspecified urinary incontinence   . Varicose vein of leg    Past Surgical History:  Procedure Laterality Date  . ABDOMINAL HYSTERECTOMY     early 40's, abdominal  . anterior heel repair    . APPENDECTOMY    . FRACTURE SURGERY  2003   left   . pheochromocytoma     left  . TOTAL HIP ARTHROPLASTY  07/31/2010   right  . WRIST FRACTURE SURGERY  2003   left Dr. Newman Nip    No Known Allergies  Allergies as of 01/11/2017   No Known Allergies     Medication List       Accurate as of 01/11/17  3:49 PM. Always use your most recent med list.          acetaminophen 325 MG tablet Commonly known as:  TYLENOL Take 2 tablets (650 mg total)  by mouth every 4 (four) hours as needed for mild pain (temperature >/= 99.5 F).   amoxicillin 500 MG capsule Commonly known as:  AMOXIL Take 2,000 mg by mouth. Before dental procedure .   aspirin 81 MG tablet Take 1 tablet (81 mg total) by mouth daily.   bisacodyl 10 MG suppository Commonly known as:  DULCOLAX Place 10 mg rectally as needed for moderate constipation.   ciprofloxacin 500 MG tablet Commonly known as:  CIPRO Take 500 mg by mouth 2 (two) times daily.   divalproex 125 MG capsule Commonly known as:  DEPAKOTE SPRINKLES Take 125 mg twice daily to  help calm aggressive behavior   feeding supplement Liqd Take 1 Container by mouth 3 (three) times daily between meals.   furosemide 20 MG tablet Commonly known as:  LASIX Take 20 mg by mouth daily.   magnesium hydroxide 400 MG/5ML suspension Commonly known as:  MILK OF MAGNESIA Take 30 mLs by mouth daily as needed for mild constipation.   multivitamin tablet Take 1 tablet by mouth daily.   OXYGEN Inhale 2 L/min into the lungs as needed.   risperiDONE 0.5 MG tablet Commonly known as:  RISPERDAL 1 nightly to control delusions and agitated behavior   saccharomyces boulardii 250 MG capsule Commonly known as:  FLORASTOR Take 250 mg by mouth 2 (two) times daily.   sennosides-docusate sodium 8.6-50 MG tablet Commonly known as:  SENOKOT-S Take 1 tablet by mouth 2 (two) times daily.   sertraline 50 MG tablet Commonly known as:  ZOLOFT One daily to help depression   spironolactone 25 MG tablet Commonly known as:  ALDACTONE Take 25 mg by mouth daily.       Review of Systems  Constitutional: Negative for appetite change, chills and diaphoresis.  HENT: Negative.   Eyes: Negative.   Respiratory: Negative for cough and chest tightness.   Cardiovascular: Negative for chest pain, palpitations and leg swelling.  Gastrointestinal: Positive for constipation. Negative for abdominal distention, abdominal pain, blood in stool, diarrhea, nausea and rectal pain.  Genitourinary: Negative.   Musculoskeletal: Positive for gait problem.       Using 4 wheel walker with seat. Painful left knee.  Skin:       Chronic changes of lower legs with darkening of the skin. Hx SCC of lower leg. Chronic itching.  Hematological: Negative.   Psychiatric/Behavioral: Positive for agitation, behavioral problems, confusion, decreased concentration and dysphoric mood. Negative for self-injury and suicidal ideas. The patient is nervous/anxious.        Poor impulse control.    Immunization History    Administered Date(s) Administered  . Influenza Split 07/09/2013  . Influenza-Unspecified 07/13/2014, 07/14/2015, 07/20/2016  . PPD Test 11/17/2013, 03/10/2016  . Pneumococcal Polysaccharide-23 10/08/1998  . Td 10/08/2005  . Zoster 10/08/2005   Pertinent  Health Maintenance Due  Topic Date Due  . PNA vac Low Risk Adult (2 of 2 - PCV13) 10/09/1999  . INFLUENZA VACCINE  05/08/2017  . DEXA SCAN  Completed   Fall Risk  03/09/2016 03/08/2016 12/30/2012  Falls in the past year? Yes Yes No  Number falls in past yr: 2 or more 2 or more -  Injury with Fall? Yes No -  Risk Factor Category  High Fall Risk - -  Risk for fall due to : History of fall(s);Impaired balance/gait;Impaired mobility - -  Follow up Falls evaluation completed - -   Functional Status Survey:    Vitals:   01/11/17 1446  BP: (!) 99/59  Pulse: 92  Resp: 20  Temp: (!) 96.6 F (35.9 C)  SpO2: 96%  Weight: 101 lb 9.6 oz (46.1 kg)   Body mass index is 18.58 kg/m. Physical Exam  Constitutional: She is oriented to person, place, and time.  frail  Eyes: EOM are normal. Pupils are equal, round, and reactive to light. Right eye exhibits no discharge. Left eye exhibits no discharge. No scleral icterus.  Watery eyes  Neck: No JVD present. No tracheal deviation present. No thyromegaly present.  Cardiovascular: Normal rate, regular rhythm, normal heart sounds and intact distal pulses.  Exam reveals no gallop and no friction rub.   No murmur heard. Pulmonary/Chest: No respiratory distress. She has no wheezes. She has rales. She exhibits no tenderness.  Rales in the chest bilaterally.  Abdominal: Bowel sounds are normal. She exhibits no distension and no mass. There is no tenderness. There is no rebound.  Musculoskeletal: Normal range of motion. She exhibits edema. She exhibits no tenderness.  Unstable gait. Using 4 wheel walker with brakes and seat.2+ edema BLE  Lymphadenopathy:    She has no cervical adenopathy.   Neurological: She is alert and oriented to person, place, and time. No cranial nerve deficit. Coordination normal.  Memory loss 10/06/14 MMSE 10/30  Skin: No rash noted. No erythema. No pallor.  Prior lesion over the left scapula removed by Dermatology.  Chronic venous stasis changes of both lower legs. Scaling of skin. 2 cm umbilicated erythematous lesion to right chin, suspicious for squamous cell carcinoma   Psychiatric:  Confused, agitated. Difficult to redirect.    Labs reviewed:  Recent Labs  09/17/16  NA 140  K 4.8  BUN 21  CREATININE 0.8    Recent Labs  09/17/16  AST 15  ALT 5*  ALKPHOS 66    Recent Labs  09/17/16  WBC 5.2  HGB 13.0  HCT 40  PLT 204   Lab Results  Component Value Date   TSH 4.15 09/17/2016   Lab Results  Component Value Date   HGBA1C 5.7 09/17/2016   Lab Results  Component Value Date   CHOL 151 09/17/2016   HDL 50 09/17/2016   LDLCALC 81 09/17/2016   TRIG 100 09/17/2016    Significant Diagnostic Results in last 30 days:  No results found.  Assessment/Plan There are no diagnoses linked to this encounter.   Family/ staff Communication:   Labs/tests ordered:

## 2017-01-11 NOTE — Assessment & Plan Note (Signed)
pacing,  agitation, combative behaviors, unable to obtain UA C/S or labs, Lorazepam 1mg  x1, too soon to eval, Hx of UTI with similar presentation, will start 7 day course of Septra DS, will obtain labs when able. Observe the patient for now.  CBC CMP UA C/S TSH Hgb a1c

## 2017-01-11 NOTE — Assessment & Plan Note (Signed)
Stable, continue Senokot S I bid, prn MOM and Bisacodyl suppository.

## 2017-01-11 NOTE — Assessment & Plan Note (Signed)
last TSH 4.15 09/17/16, update TSH

## 2017-01-11 NOTE — Assessment & Plan Note (Signed)
Controlled, hold Furosemide, Spironolactone in setting of well controlled edema, relapsed restless, decreased oral intake, possible UTI.

## 2017-01-11 NOTE — Assessment & Plan Note (Addendum)
Escalated anxiety, agitation, restlessness, prn Lorazepam 1mg  q6h prn x 24hrs, empirical treating ? UTI, observe the patient. Continue Depakote, Risperdal, Sertraline.

## 2017-01-11 NOTE — Assessment & Plan Note (Signed)
Behavioral issues, continue SNF, continue Depakote, Sertraline,  and Risperdal

## 2017-01-11 NOTE — Assessment & Plan Note (Signed)
Persisted behaviors, continue  Depakote 125mg  bid,  Sertraline 50mg  qd,  and Risperdal 0.5mg  qd. Worse. Lorazepam 1mg  q6h prn x 72 hrs, update labs, empirical tx UTI

## 2017-01-11 NOTE — Assessment & Plan Note (Addendum)
Minimal in BLE, hold Furosemide and Spironolactone, update CMP

## 2017-01-14 DIAGNOSIS — I10 Essential (primary) hypertension: Secondary | ICD-10-CM | POA: Diagnosis not present

## 2017-01-14 DIAGNOSIS — N390 Urinary tract infection, site not specified: Secondary | ICD-10-CM | POA: Diagnosis not present

## 2017-01-14 DIAGNOSIS — E0789 Other specified disorders of thyroid: Secondary | ICD-10-CM | POA: Diagnosis not present

## 2017-01-14 DIAGNOSIS — E440 Moderate protein-calorie malnutrition: Secondary | ICD-10-CM | POA: Diagnosis not present

## 2017-01-14 LAB — HEPATIC FUNCTION PANEL
ALT: 4 U/L — AB (ref 7–35)
AST: 17 U/L (ref 13–35)
Alkaline Phosphatase: 64 U/L (ref 25–125)
Bilirubin, Total: 0.4 mg/dL

## 2017-01-14 LAB — CBC AND DIFFERENTIAL
HCT: 39 % (ref 36–46)
HEMOGLOBIN: 12.8 g/dL (ref 12.0–16.0)
Platelets: 167 10*3/uL (ref 150–399)
WBC: 6.1 10^3/mL

## 2017-01-14 LAB — HEMOGLOBIN A1C: Hemoglobin A1C: 5.5

## 2017-01-14 LAB — TSH: TSH: 3.82 u[IU]/mL (ref 0.41–5.90)

## 2017-01-14 LAB — BASIC METABOLIC PANEL
BUN: 33 mg/dL — AB (ref 4–21)
CREATININE: 0.7 mg/dL (ref 0.5–1.1)
GLUCOSE: 76 mg/dL
POTASSIUM: 4.6 mmol/L (ref 3.4–5.3)
SODIUM: 142 mmol/L (ref 137–147)

## 2017-01-28 ENCOUNTER — Non-Acute Institutional Stay (SKILLED_NURSING_FACILITY): Payer: Medicare Other | Admitting: Internal Medicine

## 2017-01-28 ENCOUNTER — Encounter: Payer: Self-pay | Admitting: Internal Medicine

## 2017-01-28 DIAGNOSIS — I10 Essential (primary) hypertension: Secondary | ICD-10-CM

## 2017-01-28 DIAGNOSIS — R269 Unspecified abnormalities of gait and mobility: Secondary | ICD-10-CM

## 2017-01-28 DIAGNOSIS — F0150 Vascular dementia without behavioral disturbance: Secondary | ICD-10-CM

## 2017-01-28 DIAGNOSIS — R634 Abnormal weight loss: Secondary | ICD-10-CM

## 2017-01-28 DIAGNOSIS — F320 Major depressive disorder, single episode, mild: Secondary | ICD-10-CM

## 2017-01-28 DIAGNOSIS — E039 Hypothyroidism, unspecified: Secondary | ICD-10-CM

## 2017-01-28 DIAGNOSIS — N3000 Acute cystitis without hematuria: Secondary | ICD-10-CM

## 2017-01-28 NOTE — Progress Notes (Signed)
Progress Note    Location:  [facility] Room Number: U20 Place of Service:  SNF [phone]) Provider:  Jeanmarie Hubert, MD  Patient Care Team: [name], MD as PCP - General (Internal Medicine) Verdene Lennert, DDS as Consulting Physician (Dentistry) [name], MD as Consulting Physician (Orthopedic Surgery) [place] Man Otho Darner, NP as Nurse Practitioner (Nurse Practitioner)  Extended Emergency Contact Information Primary Emergency Contact: [name] of [place] Phone: [phone] Relation: Legal Guardian Code Status:  DNR Goals of care: Advanced Directive information Advanced Directives 01/28/2017  Does Patient Have a Medical Advance Directive? Yes  Type of Paramedic of [place];Living will;Out of facility DNR (pink MOST or yellow form)  Does patient want to make changes to medical advance directive? -  Copy of [place] in Chart? Yes  Pre-existing out of facility DNR order (yellow form or pink MOST form) Yellow form placed in chart (order not valid for inpatient use);Pink MOST form placed in chart (order not valid for inpatient use)     Chief Complaint  Patient presents with  . Medical Management of Chronic Issues    routine visit    HPI:  Pt is a 83 y.o. female seen today for medical management of chronic diseases.    Vascular dementia without behavioral disturbance - progressive loss of memory. Sometimes speaks in [place]. I think she is losing verbal skills.  Current mild episode of major depressive disorder without prior episode ([place]) - remains socially disruptive.  Essential hypertension - controlled  Abnormality of gait - generally uses wheelchair  Hypothyroidism, unspecified type - compensated  Acute cystitis without hematuria - recurrent problem. Culture 01/17/17 showe 10-50,000 colonies. No suprapubic tenderness.   Loss of weight - stable at 101# for last 3  months.    Past Medical History:  Diagnosis Date  . Abnormality of gait   . AK (actinic keratosis)    right tip of nose and right posterior leg  . Anemia   . Arthritis   . Cancer ([place]) 08/07/12   squamous cell ca,keratoacanthoma-right posterior leg  . Hemorrhoids   . Hypertension   . Hypoxemia   . Insomnia, unspecified   . Osteoarthrosis, unspecified whether generalized or localized, unspecified site   . Radiation 09/10/2012-10/24/2012   30 fractions to right posterior calf  . Radiation 09/10/12-10/24/12   Squamous cell right posterior calf 6000 cGy  . Skin cancer 06/2011   scc right elbow tx included excision  . Spastic colon   . Thyroid disease   . Traumatic closed nondisp torus fracture of distal radial metaphysis 12/08/13  . Unspecified hypothyroidism   . Unspecified urinary incontinence   . Varicose vein of leg    Past Surgical History:  Procedure Laterality Date  . ABDOMINAL HYSTERECTOMY     early 40's, abdominal  . anterior heel repair    . APPENDECTOMY    . FRACTURE SURGERY  2003   left   . pheochromocytoma     left  . TOTAL HIP ARTHROPLASTY  07/31/2010   right  . WRIST FRACTURE SURGERY  2003   left Dr. Newman Nip    No Known Allergies  Outpatient Encounter Prescriptions as of 01/28/2017  Medication Sig  . acetaminophen (TYLENOL) 325 MG tablet Take 2 tablets (650 mg total) by mouth every 4 (four) hours as needed for mild pain (temperature >/= 99.5 F).  Marland Kitchen amoxicillin (AMOXIL) 500 MG capsule Take 2,000 mg by  mouth. Before dental procedure .  Marland Kitchen aspirin 81 MG tablet Take 1 tablet (81 mg total) by mouth daily.  . bisacodyl (DULCOLAX) 10 MG suppository Place 10 mg rectally as needed for moderate constipation.  . divalproex (DEPAKOTE SPRINKLE) 125 MG capsule Take by mouth. Give one capsule once a day. Give 2 to equal 250 mg twice a day  . feeding supplement (BOOST / RESOURCE BREEZE) LIQD Take 1 Container by mouth 3 (three) times daily between meals.  Marland Kitchen LORazepam  (ATIVAN) 2 MG/ML concentrated solution Take 1 mg by mouth. Give 1 mg Ativan gel topically every 6 hours as needed for anxiety/agitation  . magnesium hydroxide (MILK OF MAGNESIA) 400 MG/5ML suspension Take 30 mLs by mouth daily as needed for mild constipation.  . Multiple Vitamins-Minerals (MULTIVITAMIN) tablet Take 1 tablet by mouth daily.  . OXYGEN Inhale 2 L/min into the lungs as needed.  . risperiDONE (RISPERDAL) 0.5 MG tablet 1 nightly to control delusions and agitated behavior  . sennosides-docusate sodium (SENOKOT-S) 8.6-50 MG tablet Take 1 tablet by mouth 2 (two) times daily.  . sertraline (ZOLOFT) 50 MG tablet One daily to help depression  . [DISCONTINUED] divalproex (DEPAKOTE SPRINKLES) 125 MG capsule Take 125 mg twice daily to help calm aggressive behavior (Patient taking differently: Take two to equal 250mg   twice daily to help calm aggressive behavior)  . [DISCONTINUED] furosemide (LASIX) 20 MG tablet Take 20 mg by mouth daily.  . [DISCONTINUED] spironolactone (ALDACTONE) 25 MG tablet Take 25 mg by mouth daily.   No facility-administered encounter medications on file as of 01/28/2017.     Review of Systems  Constitutional: Negative for appetite change, chills and diaphoresis.  HENT: Negative.   Eyes: Negative.   Respiratory: Negative for cough and chest tightness.   Cardiovascular: Negative for chest pain, palpitations and leg swelling.  Gastrointestinal: Positive for constipation. Negative for abdominal distention, abdominal pain, blood in stool, diarrhea, nausea and rectal pain.  Genitourinary:       Recurrent UTI  Musculoskeletal: Positive for gait problem.       Using 4 wheel walker with seat. Painful left knee.  Skin:       Chronic changes of lower legs with darkening of the skin. Hx SCC of lower leg. Chronic itching.  Hematological: Negative.   Psychiatric/Behavioral: Positive for agitation, behavioral problems, confusion, decreased concentration and dysphoric mood.  Negative for self-injury and suicidal ideas. The patient is nervous/anxious.        Poor impulse control. Non verbal today.    Immunization History  Administered Date(s) Administered  . Influenza Split 07/09/2013  . Influenza-Unspecified 07/13/2014, 07/14/2015, 07/20/2016  . PPD Test 11/17/2013, 03/10/2016  . Pneumococcal Polysaccharide-23 10/08/1998  . Td 10/08/2005  . Zoster 10/08/2005   Pertinent  Health Maintenance Due  Topic Date Due  . PNA vac Low Risk Adult (2 of 2 - PCV13) 10/09/1999  . INFLUENZA VACCINE  05/08/2017  . DEXA SCAN  Completed   Fall Risk  03/09/2016 03/08/2016 12/30/2012  Falls in the past year? Yes Yes No  Number falls in past yr: 2 or more 2 or more -  Injury with Fall? Yes No -  Risk Factor Category  High Fall Risk - -  Risk for fall due to : History of fall(s);Impaired balance/gait;Impaired mobility - -  Follow up Falls evaluation completed - -     Vitals:   01/28/17 1053  BP: 131/78  Pulse: 82  Resp: 18  Temp: 97.5 F (36.4 C)  SpO2:  90%  Weight: 101 lb 9.6 oz (46.1 kg)  Height: 5\' 2"  (1.575 m)   Body mass index is 18.58 kg/m.  Wt Readings from Last 3 Encounters:  01/28/17 101 lb 9.6 oz (46.1 kg)  01/11/17 101 lb 9.6 oz (46.1 kg)  01/01/17 101 lb 4.8 oz (45.9 kg)    Physical Exam  Constitutional: She is oriented to person, place, and time.  frail  Eyes: EOM are normal. Pupils are equal, round, and reactive to light. Right eye exhibits no discharge. Left eye exhibits no discharge. No scleral icterus.  Watery eyes  Neck: No JVD present. No tracheal deviation present. No thyromegaly present.  Cardiovascular: Normal rate, regular rhythm, normal heart sounds and intact distal pulses.  Exam reveals no gallop and no friction rub.   No murmur heard. Pulmonary/Chest: No respiratory distress. She has no wheezes. She has rales. She exhibits no tenderness.  Rales in the chest bilaterally.  Abdominal: Bowel sounds are normal. She exhibits no  distension and no mass. There is no tenderness. There is no rebound.  Musculoskeletal: Normal range of motion. She exhibits edema. She exhibits no tenderness.  Unstable gait. Using 4 wheel walker with brakes and seat. Trace edema BLE  Lymphadenopathy:    She has no cervical adenopathy.  Neurological: She is alert and oriented to person, place, and time. No cranial nerve deficit. Coordination normal.  Memory loss 10/06/14 MMSE 10/30  Skin: No rash noted. No erythema. No pallor.  Prior lesion over the left scapula removed by Dermatology.  Chronic venous stasis changes of both lower legs. Scaling of skin. 2 cm umbilicated erythematous lesion to right chin, suspicious for squamous cell carcinoma   Psychiatric:  Confused, agitated. Difficult to redirect.    Labs reviewed:  Recent Labs  09/17/16 01/14/17  NA 140 142  K 4.8 4.6  BUN 21 33*  CREATININE 0.8 0.7    Recent Labs  09/17/16 01/14/17  AST 15 17  ALT 5* 4*  ALKPHOS 66 64    Recent Labs  09/17/16 01/14/17  WBC 5.2 6.1  HGB 13.0 12.8  HCT 40 39  PLT 204 167   Lab Results  Component Value Date   TSH 3.82 01/14/2017   Lab Results  Component Value Date   HGBA1C 5.5 01/14/2017   Lab Results  Component Value Date   CHOL 151 09/17/2016   HDL 50 09/17/2016   LDLCALC 81 09/17/2016   TRIG 100 09/17/2016    Assessment/Plan 1. Vascular dementia without behavioral disturbance   2. Current mild episode of major depressive disorder without prior episode [facility]) The current medical regimen is effective;  continue present plan and medications.  3. Essential hypertension The current medical regimen is effective;  continue present plan and medications.  4. Abnormality of gait I do not expect her to be safely capable of walking again.    5. Hypothyroidism, unspecified type compensated  6. Acute cystitis without hematuria High potential for further infections  7. Loss of weight Weigh stable for several  months

## 2017-01-31 ENCOUNTER — Encounter (HOSPITAL_COMMUNITY): Payer: Self-pay | Admitting: Emergency Medicine

## 2017-01-31 ENCOUNTER — Emergency Department (HOSPITAL_COMMUNITY)
Admission: EM | Admit: 2017-01-31 | Discharge: 2017-02-01 | Disposition: A | Discharge status: Home / Self Care | Payer: Medicare Other | Attending: Emergency Medicine | Admitting: Emergency Medicine

## 2017-01-31 DIAGNOSIS — R531 Weakness: Secondary | ICD-10-CM | POA: Insufficient documentation

## 2017-01-31 DIAGNOSIS — I252 Old myocardial infarction: Secondary | ICD-10-CM | POA: Diagnosis not present

## 2017-01-31 DIAGNOSIS — R404 Transient alteration of awareness: Secondary | ICD-10-CM | POA: Diagnosis not present

## 2017-01-31 DIAGNOSIS — Z96641 Presence of right artificial hip joint: Secondary | ICD-10-CM | POA: Insufficient documentation

## 2017-01-31 DIAGNOSIS — N390 Urinary tract infection, site not specified: Secondary | ICD-10-CM | POA: Diagnosis not present

## 2017-01-31 DIAGNOSIS — E039 Hypothyroidism, unspecified: Secondary | ICD-10-CM | POA: Diagnosis not present

## 2017-01-31 DIAGNOSIS — Z7982 Long term (current) use of aspirin: Secondary | ICD-10-CM | POA: Diagnosis not present

## 2017-01-31 DIAGNOSIS — Z79899 Other long term (current) drug therapy: Secondary | ICD-10-CM | POA: Insufficient documentation

## 2017-01-31 DIAGNOSIS — D649 Anemia, unspecified: Secondary | ICD-10-CM | POA: Diagnosis not present

## 2017-01-31 DIAGNOSIS — I10 Essential (primary) hypertension: Secondary | ICD-10-CM | POA: Insufficient documentation

## 2017-01-31 DIAGNOSIS — Z87891 Personal history of nicotine dependence: Secondary | ICD-10-CM | POA: Insufficient documentation

## 2017-01-31 DIAGNOSIS — Z85828 Personal history of other malignant neoplasm of skin: Secondary | ICD-10-CM | POA: Insufficient documentation

## 2017-01-31 DIAGNOSIS — R0602 Shortness of breath: Secondary | ICD-10-CM | POA: Diagnosis not present

## 2017-01-31 DIAGNOSIS — R627 Adult failure to thrive: Secondary | ICD-10-CM | POA: Insufficient documentation

## 2017-01-31 LAB — HEPATIC FUNCTION PANEL
ALK PHOS: 131 U/L — AB (ref 25–125)
ALT: 12 U/L (ref 7–35)
AST: 38 U/L — AB (ref 13–35)
BILIRUBIN, TOTAL: 0.3 mg/dL

## 2017-01-31 LAB — BASIC METABOLIC PANEL
BUN: 153 mg/dL — AB (ref 4–21)
Creatinine: 2.6 mg/dL — AB (ref <=1.1)
Glucose: 149 mg/dL
POTASSIUM: 4.6 mmol/L (ref 3.4–5.3)
SODIUM: 165 mmol/L — AB (ref 137–147)

## 2017-01-31 LAB — CBC AND DIFFERENTIAL
HEMATOCRIT: 51 % — AB (ref 36–46)
HEMOGLOBIN: 15.9 g/dL (ref 12.0–16.0)
Platelets: 157 10*3/uL (ref 150–399)
WBC: 8.7 10^3/mL

## 2017-01-31 NOTE — ED Notes (Signed)
[facility] to discuss pt being discharged.  Staff reports that pt's POA (state appointed) made decision for pt to be sent to ED after her new unresponsiveness.  Pt's MOST form states that she does not want fluids and abx are to be used sparingly & she has a signed DNR.  Pt does not appear to be in any pain or discomfort at this time.  MD Tyrone Nine has examined her.  I will continue to attempt to contact her POA to determine if he wishes to being treatment.  Until then I will continue to monitor pt's vitals & give comfort measures.

## 2017-01-31 NOTE — ED Provider Notes (Signed)
[facility] DEPT Provider Note   CSN: [id number] Arrival date & time: 01/31/17  2043     History   Chief Complaint Chief Complaint  Patient presents with  . Weakness    HPI Carol [name] is a 83 y.o. female.  83 yo F with a chief complaint of failure to thrive. The patient has not had anything to eat or drink for the past week. She is now not responding and his breathing at a much slower rate than normal. She has a most form which states that she is [place] only. PMS is unsure why she was asked to be transferred. I attended to get in touch with her guardian but its a law office. Level V caveat nonverbal   The history is provided by the patient and the EMS personnel.  Weakness  Primary symptoms include no dizziness. This is a new problem. The current episode started more than 2 days ago. The problem has not changed since onset.Pertinent negatives include no shortness of breath, no chest pain, no vomiting and no headaches.    Past Medical History:  Diagnosis Date  . Abnormality of gait   . AK (actinic keratosis)    right tip of nose and right posterior leg  . Anemia   . Arthritis   . Cancer ([place]) 08/07/12   squamous cell ca,keratoacanthoma-right posterior leg  . Hemorrhoids   . Hypertension   . Hypoxemia   . Insomnia, unspecified   . Osteoarthrosis, unspecified whether generalized or localized, unspecified site   . Radiation 09/10/2012-10/24/2012   30 fractions to right posterior calf  . Radiation 09/10/12-10/24/12   Squamous cell right posterior calf 6000 cGy  . Skin cancer 06/2011   scc right elbow tx included excision  . Spastic colon   . Thyroid disease   . Traumatic closed nondisp torus fracture of distal radial metaphysis 12/08/13  . Unspecified hypothyroidism   . Unspecified urinary incontinence   . Varicose vein of leg     Patient Active Problem List   Diagnosis Date Noted  . Pneumonia involving right lung 10/09/2016  . Skin laceration 06/12/2016  .  Infected open wound 05/17/2016  . Falls 03/09/2016  . Impulse control disease 02/28/2016  . Delusions ([place]) 02/07/2016  . Edema 12/09/2015  . Depression, major 10/06/2015  . Loss of weight 03/10/2015  . Skin cancer 03/10/2015  . PBA (pseudobulbar affect) 11/16/2014  . Left knee pain 01/12/2014  . Traumatic closed nondisp torus fracture of distal radial metaphysis 12/11/2013  . Dementia 11/20/2013  . Generalized anxiety disorder 11/13/2013  . NSTEMI (non-ST elevated myocardial infarction) ([place]) 11/05/2013  . Soft tissue injury to right hip and suprapubic area 11/05/2013  . Palliative care encounter 11/05/2013  . Weakness generalized 11/05/2013  . UTI (urinary tract infection) 11/04/2013  . Skin cancer of trunk 08/04/2013  . Venous stasis dermatitis 08/04/2013  . Squamous cell carcinoma of R post calf 12/09/2012  . Constipation 10/30/2012  . Open wound of knee, leg (except thigh), and ankle, complicated 35/32/9924  . Lumbago 10/30/2012  . Abnormality of gait 10/30/2012  . Pain in joint, pelvic region and thigh 10/30/2012  . Unspecified malignant neoplasm of skin of upper limb, including shoulder 10/30/2012  . Hypothyroidism 10/30/2012  . Essential hypertension 10/30/2012  . Varicose veins of lower extremity 10/30/2012  . Hemorrhoids 10/30/2012  . Irritable bowel syndrome 10/30/2012  . Osteoarthritis 10/30/2012  . Insomnia 10/30/2012  . Unspecified urinary incontinence 10/30/2012  . AK (actinic keratosis)   .  Squamous cell skin cancer 08/07/2012    Past Surgical History:  Procedure Laterality Date  . ABDOMINAL HYSTERECTOMY     early 40's, abdominal  . anterior heel repair    . APPENDECTOMY    . FRACTURE SURGERY  2003   left   . pheochromocytoma     left  . TOTAL HIP ARTHROPLASTY  07/31/2010   right  . WRIST FRACTURE SURGERY  2003   left Dr. Newman Nip    OB History    No data available       Home Medications    Prior to Admission medications     Medication Sig Start Date End Date Taking? Authorizing Provider  acetaminophen (TYLENOL) 325 MG tablet Take 2 tablets (650 mg total) by mouth every 4 (four) hours as needed for mild pain (temperature >/= 99.5 F). 11/06/13   Christina P Rama, MD  amoxicillin (AMOXIL) 500 MG capsule Take 2,000 mg by mouth. Before dental procedure .    Historical Provider, MD  aspirin 81 MG tablet Take 1 tablet (81 mg total) by mouth daily. 11/06/13   Venetia Maxon Rama, MD  bisacodyl (DULCOLAX) 10 MG suppository Place 10 mg rectally as needed for moderate constipation.    Historical Provider, MD  divalproex (DEPAKOTE SPRINKLE) 125 MG capsule Take by mouth. Give one capsule once a day. Give 2 to equal 250 mg twice a day    Historical Provider, MD  feeding supplement (BOOST / RESOURCE BREEZE) LIQD Take 1 Container by mouth 3 (three) times daily between meals.    Historical Provider, MD  LORazepam (ATIVAN) 2 MG/ML concentrated solution Take 1 mg by mouth. Give 1 mg Ativan gel topically every 6 hours as needed for anxiety/agitation    Historical Provider, MD  magnesium hydroxide (MILK OF MAGNESIA) 400 MG/5ML suspension Take 30 mLs by mouth daily as needed for mild constipation.    Historical Provider, MD  Multiple Vitamins-Minerals (MULTIVITAMIN) tablet Take 1 tablet by mouth daily.    Historical Provider, MD  OXYGEN Inhale 2 L/min into the lungs as needed.    Historical Provider, MD  risperiDONE (RISPERDAL) 0.5 MG tablet 1 nightly to control delusions and agitated behavior 02/07/16   [name], MD  sennosides-docusate sodium (SENOKOT-S) 8.6-50 MG tablet Take 1 tablet by mouth 2 (two) times daily.    Historical Provider, MD  sertraline (ZOLOFT) 50 MG tablet One daily to help depression 02/07/16   [name], MD    Family History Family History  Problem Relation Age of Onset  . Stroke Mother   . Stroke Father     Social History Social History  Substance Use Topics  . Smoking status: Former Smoker    Packs/day:  0.50    Years: 10.00    Types: Cigarettes    Quit date: 10/30/1978  . Smokeless tobacco: Never Used  . Alcohol use 1.8 oz/week    3 Glasses of wine per week     Allergies   Patient has no known allergies.   Review of Systems Review of Systems  Unable to perform ROS: Patient nonverbal  Constitutional: Negative for chills and fever.  HENT: Negative for congestion and rhinorrhea.   Eyes: Negative for redness and visual disturbance.  Respiratory: Negative for shortness of breath and wheezing.   Cardiovascular: Negative for chest pain and palpitations.  Gastrointestinal: Negative for nausea and vomiting.  Genitourinary: Negative for dysuria and urgency.  Musculoskeletal: Negative for arthralgias and myalgias.  Skin: Negative for pallor and  wound.  Neurological: Positive for weakness. Negative for dizziness and headaches.     Physical Exam Updated Vital Signs SpO2 98%   Physical Exam  Constitutional: She appears lethargic. No distress.  HENT:  Head: Normocephalic and atraumatic.  Dry and tacky mucous membranes  Eyes: EOM are normal. Pupils are equal, round, and reactive to light.  Neck: Normal range of motion. Neck supple.  Cardiovascular: Normal rate and regular rhythm.  Exam reveals no gallop and no friction rub.   No murmur heard. Pulmonary/Chest: Effort normal. She has no wheezes. She has no rales.  RR about 6/min  Abdominal: Soft. She exhibits no distension and no mass. There is no tenderness. There is no guarding.  Musculoskeletal: She exhibits no edema or tenderness.  Neurological: She appears lethargic. GCS eye subscore is 4. GCS verbal subscore is 1. GCS motor subscore is 4.  Skin: Skin is warm and dry. She is not diaphoretic.  Psychiatric: She has a normal mood and affect. Her behavior is normal.  Nursing note and vitals reviewed.    ED Treatments / Results  Labs (all labs ordered are listed, but only abnormal results are displayed) Labs Reviewed - No data  to display  EKG  EKG Interpretation None       Radiology No results found.  Procedures Procedures (including critical care time)  Medications Ordered in ED Medications - No data to display   Initial Impression / Assessment and Plan / ED Course  I have reviewed the triage vital signs and the nursing notes.  Pertinent labs & imaging results that were available during my care of the patient were reviewed by me and considered in my medical decision making (see chart for details).     83 yo F With a chief complaint of failure to thrive. Patient has a most form which states that she would like to be comfort care only. Patient appears very comfortable on exam. She is in no acute distress. She does appear clinically dehydrated. She has listed that she does not want IV fluids, or CPR. Only limited antibiotics.  I do not feel that a workup at this time would be beneficial to the patient. This would be against their wishes. I will discharge her back to her nursing facility.    Medications given during this visit Medications - No data to display   The patient appears reasonably screen and/or stabilized for discharge and I doubt any other medical condition or other [facility] requiring further screening, evaluation, or treatment in the ED at this time prior to discharge.    Final Clinical Impressions(s) / ED Diagnoses   Final diagnoses:  Failure to thrive in adult    New Prescriptions New Prescriptions   No medications on file     Deno Etienne, DO 01/31/17 2103

## 2017-01-31 NOTE — ED Notes (Signed)
Bed: BI37 Expected date:  Expected time:  Means of arrival:  Comments: 83 yo F  Comfort care, dehydration, lethargic

## 2017-01-31 NOTE — ED Notes (Signed)
Attempted to call POA again.  Will keep attempting and provide comfort measures.

## 2017-01-31 NOTE — ED Triage Notes (Signed)
Pt from [facility] for weakness and unresponsiveness.  Pt is a DNR on comfort care, physical copies of DNR and MOST forms on hand.  Pt grimaces some w/moving from stretcher to bed but does not verbally respond.  Agonal gasping.  PAINAD scale shows no pain.  Advanced dementia.

## 2017-02-01 ENCOUNTER — Encounter: Payer: Self-pay | Admitting: Nurse Practitioner

## 2017-02-01 ENCOUNTER — Non-Acute Institutional Stay (SKILLED_NURSING_FACILITY): Payer: Medicare Other | Admitting: Nurse Practitioner

## 2017-02-01 DIAGNOSIS — R627 Adult failure to thrive: Secondary | ICD-10-CM

## 2017-02-01 DIAGNOSIS — F0150 Vascular dementia without behavioral disturbance: Secondary | ICD-10-CM | POA: Diagnosis not present

## 2017-02-01 DIAGNOSIS — N179 Acute kidney failure, unspecified: Secondary | ICD-10-CM | POA: Diagnosis not present

## 2017-02-01 NOTE — ED Notes (Signed)
Pt on comfort care

## 2017-02-01 NOTE — ED Notes (Signed)
[facility] and notified them of pt's return  Report given

## 2017-02-01 NOTE — Assessment & Plan Note (Signed)
Comfort measures only

## 2017-02-01 NOTE — Assessment & Plan Note (Signed)
Will continue to provide comfort measures per MOST and POA in SNF, dc all meds since the patient is pocketing food

## 2017-02-01 NOTE — ED Notes (Signed)
Attempted to call POA again.  Will keep attempting and provide comfort meausres.

## 2017-02-01 NOTE — Assessment & Plan Note (Signed)
Dc all meds, 01/31/17 Na 165, K 4.6, Bun 153, creat 2.63, wbc 8.7, Hgb 15.9, plt 157, CXR no active cardiopulmonary disease, 02/01/17 UA wbc 2+, few bacteria, negative nitrite. ED eval 01/31/17. MOST and POA: comfort measures only.

## 2017-02-01 NOTE — ED Notes (Signed)
Attempted to call POA again.  Will keep attempting and provide comfort measures.  Charge Lattie Haw and MD Tyrone Nine aware.

## 2017-02-01 NOTE — ED Notes (Signed)
Pt's brief changed and repositioned.

## 2017-02-01 NOTE — Progress Notes (Signed)
Location:  [facility] Room Number: 24 Place of Service:  SNF (31) Provider:  Galia Rahm, [place] NP  Jeanmarie Hubert, MD  Patient Care Team: [name], MD as PCP - General (Internal Medicine) Verdene Lennert, DDS as Consulting Physician (Dentistry) [name], MD as Consulting Physician (Orthopedic Surgery) [place] Rayna Brenner Otho Darner, NP as Nurse Practitioner (Nurse Practitioner)  Extended Emergency Contact Information Primary Emergency Contact: [name] of [place] Phone: [phone] Relation: Legal Guardian  Code Status:  DNR Goals of care: Advanced Directive information Advanced Directives 02/01/2017  Does Patient Have a Medical Advance Directive? Yes  Type of Advance Directive Out of facility DNR (pink MOST or yellow form);[place];Living will  Does patient want to make changes to medical advance directive? No - Patient declined  Copy of [place] in Chart? Yes  Pre-existing out of facility DNR order (yellow form or pink MOST form) Yellow form placed in chart (order not valid for inpatient use)     Chief Complaint  Patient presents with  . Acute Visit    elevated sodium    HPI:  Pt is a 83 y.o. female seen today for an acute visit for altered mental status, the patient was found to be lethargic, pocking food, stat Na 165, Bun 153, creat 2.63, ED eval 01/31/17, returned to [facility] for comfort measures only. The patient was only responded to touch upon my visit today, but she appears comfortable.   Hx of dementia with impulsive control behaviors, HTN, chronic BLE edema, Hypothyroidism.    Past Medical History:  Diagnosis Date  . Abnormality of gait   . AK (actinic keratosis)    right tip of nose and right posterior leg  . Anemia   . Arthritis   . Cancer ([place]) 08/07/12   squamous cell ca,keratoacanthoma-right posterior leg  . Hemorrhoids   . Hypertension   . Hypoxemia   .  Insomnia, unspecified   . Osteoarthrosis, unspecified whether generalized or localized, unspecified site   . Radiation 09/10/2012-10/24/2012   30 fractions to right posterior calf  . Radiation 09/10/12-10/24/12   Squamous cell right posterior calf 6000 cGy  . Skin cancer 06/2011   scc right elbow tx included excision  . Spastic colon   . Thyroid disease   . Traumatic closed nondisp torus fracture of distal radial metaphysis 12/08/13  . Unspecified hypothyroidism   . Unspecified urinary incontinence   . Varicose vein of leg    Past Surgical History:  Procedure Laterality Date  . ABDOMINAL HYSTERECTOMY     early 40's, abdominal  . anterior heel repair    . APPENDECTOMY    . FRACTURE SURGERY  2003   left   . pheochromocytoma     left  . TOTAL HIP ARTHROPLASTY  07/31/2010   right  . WRIST FRACTURE SURGERY  2003   left Dr. Newman Nip    No Known Allergies  Outpatient Encounter Prescriptions as of 02/01/2017  Medication Sig  . acetaminophen (TYLENOL) 325 MG tablet Take 2 tablets (650 mg total) by mouth every 4 (four) hours as needed for mild pain (temperature >/= 99.5 F).  Marland Kitchen amoxicillin (AMOXIL) 500 MG capsule Take 2,000 mg by mouth. Before dental procedure .  Marland Kitchen aspirin 81 MG tablet Take 1 tablet (81 mg total) by mouth daily.  . bisacodyl (DULCOLAX) 10 MG suppository Place 10 mg rectally as needed for moderate constipation.  . divalproex (DEPAKOTE SPRINKLE) 125  MG capsule Take by mouth. Give one capsule once a day. Give 2 to equal 250 mg twice a day  . feeding supplement (BOOST / RESOURCE BREEZE) LIQD Take 1 Container by mouth 3 (three) times daily between meals.  Marland Kitchen LORazepam (ATIVAN) 2 MG/ML concentrated solution Take 1 mg by mouth. Give 1 mg Ativan gel topically every 6 hours as needed for anxiety/agitation  . magnesium hydroxide (MILK OF MAGNESIA) 400 MG/5ML suspension Take 30 mLs by mouth daily as needed for mild constipation.  . Multiple Vitamins-Minerals (MULTIVITAMIN) tablet  Take 1 tablet by mouth daily.  . OXYGEN Inhale 2 L/min into the lungs as needed.  . risperiDONE (RISPERDAL) 0.5 MG tablet 1 nightly to control delusions and agitated behavior  . sennosides-docusate sodium (SENOKOT-S) 8.6-50 MG tablet Take 1 tablet by mouth 2 (two) times daily.  . sertraline (ZOLOFT) 50 MG tablet One daily to help depression   No facility-administered encounter medications on file as of 02/01/2017.     Review of Systems  Constitutional: Positive for activity change, appetite change and fatigue. Negative for chills and diaphoresis.  HENT: Negative.   Eyes: Negative.   Respiratory: Negative for cough and chest tightness.   Cardiovascular: Negative for chest pain, palpitations and leg swelling.  Gastrointestinal: Positive for constipation. Negative for abdominal distention, abdominal pain, blood in stool, diarrhea, nausea and rectal pain.  Genitourinary:       Recurrent UTI  Musculoskeletal: Positive for gait problem.       Using 4 wheel walker with seat. Painful left knee.  Skin:       Chronic changes of lower legs with darkening of the skin. Hx SCC of lower leg. Chronic itching.  Hematological: Negative.   Psychiatric/Behavioral: Positive for confusion and decreased concentration. Negative for agitation, behavioral problems, dysphoric mood, self-injury and suicidal ideas. The patient is not nervous/anxious.        Poor impulse control. Non verbal today.    Immunization History  Administered Date(s) Administered  . Influenza Split 07/09/2013  . Influenza-Unspecified 07/13/2014, 07/14/2015, 07/20/2016  . PPD Test 11/17/2013, 03/10/2016  . Pneumococcal Polysaccharide-23 10/08/1998  . Td 10/08/2005  . Zoster 10/08/2005   Pertinent  Health Maintenance Due  Topic Date Due  . PNA vac Low Risk Adult (2 of 2 - PCV13) 10/09/1999  . INFLUENZA VACCINE  05/08/2017  . DEXA SCAN  Completed   Fall Risk  03/09/2016 03/08/2016 12/30/2012  Falls in the past year? Yes Yes No    Number falls in past yr: 2 or more 2 or more -  Injury with Fall? Yes No -  Risk Factor Category  High Fall Risk - -  Risk for fall due to : History of fall(s);Impaired balance/gait;Impaired mobility - -  Follow up Falls evaluation completed - -   Functional Status Survey:    Vitals:   02/01/17 1100  BP: 108/76  Pulse: 72  Resp: 18  Temp: 97.5 F (36.4 C)  SpO2: (!) 83%  Weight: 101 lb 4.8 oz (45.9 kg)  Height: 5\' 2"  (1.575 m)   Body mass index is 18.53 kg/m. Physical Exam  Constitutional: She is oriented to person, place, and time.  frail  Eyes: EOM are normal. Pupils are equal, round, and reactive to light. Right eye exhibits no discharge. Left eye exhibits no discharge. No scleral icterus.  Watery eyes  Neck: No JVD present. No tracheal deviation present. No thyromegaly present.  Cardiovascular: Normal rate, regular rhythm, normal heart sounds and intact distal pulses.  Exam reveals no gallop and no friction rub.   No murmur heard. Pulmonary/Chest: No respiratory distress. She has no wheezes. She has rales. She exhibits no tenderness.  Rales in the chest bilaterally.  Abdominal: Bowel sounds are normal. She exhibits no distension and no mass. There is no tenderness. There is no rebound.  Musculoskeletal: Normal range of motion. She exhibits edema. She exhibits no tenderness.  Unstable gait. Using 4 wheel walker with brakes and seat. Trace edema BLE  Lymphadenopathy:    She has no cervical adenopathy.  Neurological: She is alert and oriented to person, place, and time. No cranial nerve deficit. Coordination normal.  Memory loss 10/06/14 MMSE 10/30  Skin: No rash noted. No erythema. No pallor.  Prior lesion over the left scapula removed by Dermatology.  Chronic venous stasis changes of both lower legs. Scaling of skin. 2 cm umbilicated erythematous lesion to right chin, suspicious for squamous cell carcinoma   Psychiatric:  Confused, agitated. Difficult to redirect.     Labs reviewed:  Recent Labs  09/17/16 01/14/17 01/31/17  NA 140 142 165*  K 4.8 4.6 4.6  BUN 21 33* 153*  CREATININE 0.8 0.7 2.6*    Recent Labs  09/17/16 01/14/17 01/31/17  AST 15 17 38*  ALT 5* 4* 12  ALKPHOS 66 64 131*    Recent Labs  09/17/16 01/14/17 01/31/17  WBC 5.2 6.1 8.7  HGB 13.0 12.8 15.9  HCT 40 39 51*  PLT 204 167 157   Lab Results  Component Value Date   TSH 3.82 01/14/2017   Lab Results  Component Value Date   HGBA1C 5.5 01/14/2017   Lab Results  Component Value Date   CHOL 151 09/17/2016   HDL 50 09/17/2016   LDLCALC 81 09/17/2016   TRIG 100 09/17/2016    Significant Diagnostic Results in last 30 days:  No results found.  Assessment/Plan AKI (acute kidney injury) ([place]) Dc all meds, 01/31/17 Na 165, K 4.6, Bun 153, creat 2.63, wbc 8.7, Hgb 15.9, plt 157, CXR no active cardiopulmonary disease, 02/01/17 UA wbc 2+, few bacteria, negative nitrite. ED eval 01/31/17. MOST and POA: comfort measures only.   Adult failure to thrive Will continue to provide comfort measures per MOST and POA in SNF, dc all meds since the patient is pocketing food  Dementia Comfort measures only.      Family/ staff Communication: SNF comfort measures.   Labs/tests ordered:  none

## 2017-02-01 NOTE — ED Notes (Signed)
Spoke to [employer].  Updated her on the pt's condition.  She states "as long as she's comfortable and not in any pain I am happy with you following the guidelines of her MOST and DNR form and returning her to [facility] to receive comfort care".  Will alert facility to this.

## 2017-02-05 DEATH — deceased
